# Patient Record
Sex: Female | Born: 1965 | Race: White | Hispanic: No | Marital: Single | Smoking: Former smoker
Health system: Southern US, Community
[De-identification: ages and names within clinical notes are randomized; demographics above are authoritative.]

## PROBLEM LIST (undated history)

## (undated) DIAGNOSIS — M1990 Unspecified osteoarthritis, unspecified site: Secondary | ICD-10-CM

## (undated) DIAGNOSIS — N200 Calculus of kidney: Secondary | ICD-10-CM

## (undated) DIAGNOSIS — I10 Essential (primary) hypertension: Secondary | ICD-10-CM

## (undated) DIAGNOSIS — F988 Other specified behavioral and emotional disorders with onset usually occurring in childhood and adolescence: Secondary | ICD-10-CM

## (undated) DIAGNOSIS — T7840XA Allergy, unspecified, initial encounter: Secondary | ICD-10-CM

## (undated) DIAGNOSIS — R569 Unspecified convulsions: Secondary | ICD-10-CM

## (undated) DIAGNOSIS — G709 Myoneural disorder, unspecified: Secondary | ICD-10-CM

## (undated) DIAGNOSIS — G2581 Restless legs syndrome: Secondary | ICD-10-CM

## (undated) DIAGNOSIS — N83201 Unspecified ovarian cyst, right side: Secondary | ICD-10-CM

## (undated) DIAGNOSIS — Z87442 Personal history of urinary calculi: Secondary | ICD-10-CM

## (undated) DIAGNOSIS — F329 Major depressive disorder, single episode, unspecified: Secondary | ICD-10-CM

## (undated) DIAGNOSIS — F32A Depression, unspecified: Secondary | ICD-10-CM

## (undated) DIAGNOSIS — R011 Cardiac murmur, unspecified: Secondary | ICD-10-CM

## (undated) DIAGNOSIS — D649 Anemia, unspecified: Secondary | ICD-10-CM

## (undated) HISTORY — DX: Unspecified ovarian cyst, right side: N83.201

## (undated) HISTORY — DX: Other specified behavioral and emotional disorders with onset usually occurring in childhood and adolescence: F98.8

## (undated) HISTORY — DX: Allergy, unspecified, initial encounter: T78.40XA

## (undated) HISTORY — PX: WISDOM TOOTH EXTRACTION: SHX21

## (undated) HISTORY — DX: Depression, unspecified: F32.A

## (undated) HISTORY — DX: Myoneural disorder, unspecified: G70.9

## (undated) HISTORY — DX: Major depressive disorder, single episode, unspecified: F32.9

## (undated) HISTORY — DX: Anemia, unspecified: D64.9

## (undated) HISTORY — DX: Unspecified osteoarthritis, unspecified site: M19.90

## (undated) HISTORY — DX: Restless legs syndrome: G25.81

## (undated) HISTORY — DX: Unspecified convulsions: R56.9

## (undated) HISTORY — DX: Cardiac murmur, unspecified: R01.1

## (undated) HISTORY — DX: Calculus of kidney: N20.0

---

## 2000-04-15 ENCOUNTER — Emergency Department (HOSPITAL_COMMUNITY): Admission: EM | Admit: 2000-04-15 | Discharge: 2000-04-15 | Payer: Self-pay | Admitting: Emergency Medicine

## 2006-06-20 HISTORY — PX: APPENDECTOMY: SHX54

## 2007-06-21 HISTORY — PX: BUNIONECTOMY: SHX129

## 2010-03-18 ENCOUNTER — Ambulatory Visit (HOSPITAL_COMMUNITY): Admission: RE | Admit: 2010-03-18 | Discharge: 2010-03-18 | Payer: Self-pay | Admitting: Obstetrics & Gynecology

## 2013-06-20 LAB — HM PAP SMEAR
HM Pap smear: 1012015
HM Pap smear: 1012015

## 2013-08-05 ENCOUNTER — Other Ambulatory Visit: Payer: Self-pay

## 2013-08-05 ENCOUNTER — Ambulatory Visit (INDEPENDENT_AMBULATORY_CARE_PROVIDER_SITE_OTHER): Payer: Managed Care, Other (non HMO) | Admitting: Family Medicine

## 2013-08-05 ENCOUNTER — Encounter: Payer: Self-pay | Admitting: Family Medicine

## 2013-08-05 VITALS — BP 122/88 | Temp 99.0°F | Ht 65.75 in | Wt 202.0 lb

## 2013-08-05 DIAGNOSIS — Z7689 Persons encountering health services in other specified circumstances: Secondary | ICD-10-CM

## 2013-08-05 DIAGNOSIS — E669 Obesity, unspecified: Secondary | ICD-10-CM

## 2013-08-05 DIAGNOSIS — Z7189 Other specified counseling: Secondary | ICD-10-CM

## 2013-08-05 DIAGNOSIS — Z0000 Encounter for general adult medical examination without abnormal findings: Secondary | ICD-10-CM

## 2013-08-05 NOTE — Patient Instructions (Signed)
-  We have ordered labs or studies at this visit. It can take up to 1-2 weeks for results and processing. We will contact you with instructions IF your results are abnormal. Normal results will be released to your [facility]. If you have not heard from [place] or can not find your results in [facility] in 2 weeks please contact our office.  -PLEASE SIGN UP FOR MYCHART TODAY   We recommend the following healthy lifestyle measures: - eat a healthy diet consisting of lots of vegetables, fruits, beans, nuts, seeds, healthy meats such as white chicken and fish and whole grains.  - avoid fried foods, fast food, processed foods, sodas, red meet and other fattening foods.  - get a least 150 minutes of aerobic exercise per week.   Follow up in: 6 months or as needed

## 2013-08-05 NOTE — Addendum Note (Signed)
Addended by: Elmer Picker on: 08/05/2013 02:08 PM   Modules accepted: Orders

## 2013-08-05 NOTE — Progress Notes (Signed)
Chief Complaint  Patient presents with  . Establish Care    HPI:  Sarah Simpson is here to establish care. Recently got insurance. Last PCP and physical: [facility] in [place] for gyn - had physical; no blood work done - sees gyn for heavy menstrual cycles.  Has the following chronic problems and concerns today:  There are no active problems to display for this patient.  Depression/ADD - reports tested and mild: -used to be on prozac -off for 3 years -lost mother -felt like counseling didn't help -doesn't want to do any medicine for now as is doing better -no SI  Restless leg syndrome: -used to take neuronotin, but does not want to take something now  Health Maintenance: Has had colonoscopy - she is going to schedule  ROS: See pertinent positives and negatives per HPI.  Past Medical History  Diagnosis Date  . Depression   . ADD (attention deficit disorder)   . Restless leg syndrome     Family History  Problem Relation Age of Onset  . Colon cancer Father 20  . Colon cancer Mother 43  . Heart attack Mother   . Dementia Mother     History   Social History  . Marital Status: Single    Spouse Name: N/A    Number of Children: N/A  . Years of Education: N/A   Social History Main Topics  . Smoking status: Former Research scientist (life sciences)  . Smokeless tobacco: None     Comment: on and off in the past, quit in 2002  . Alcohol Use: Yes     Comment: occ  . Drug Use: None  . Sexual Activity: None   Other Topics Concern  . None   Social History Narrative   Work or School: Clinical biochemist      Home Situation: none      Spiritual Beliefs: Christian      Lifestyle: no regular exercise; so so             No current outpatient prescriptions on file.  EXAMDanley Danker Vitals:   08/05/13 1323  BP: 122/88  Temp: 99 F (37.2 C)    Body mass index is 32.85 kg/(m^2).  GENERAL: vitals reviewed and listed above, alert, oriented, appears well hydrated and in no  acute distress  HEENT: atraumatic, conjunttiva clear, no obvious abnormalities on inspection of external nose and ears  NECK: no obvious masses on inspection  LUNGS: clear to auscultation bilaterally, no wheezes, rales or rhonchi, good air movement  CV: HRRR, no peripheral edema  MS: moves all extremities without noticeable abnormality  PSYCH: pleasant and cooperative, no obvious depression or anxiety  ASSESSMENT AND PLAN:  Discussed the following assessment and plan:  Encounter to establish care  Obesity, unspecified - Plan: TSH, T4, Free  Visit for preventive health examination - Plan: Lipid panel, Hemoglobin A1c  -We reviewed the PMH, PSH, FH, SH, Meds and Allergies. -We provided refills for any medications we will prescribe as needed. -We addressed current concerns per orders and patient instructions. -We have asked for records for pertinent exams, studies, vaccines and notes from previous providers. -We have advised patient to follow up per instructions below. -NON-FASTING labs -discussed may start prozac if needed, holding off for now but will call if wants to start   -Patient advised to return or notify a doctor immediately if symptoms worsen or persist or new concerns arise.  Patient Instructions  -We have ordered labs or studies at this  visit. It can take up to 1-2 weeks for results and processing. We will contact you with instructions IF your results are abnormal. Normal results will be released to your [facility]. If you have not heard from [place] or can not find your results in [facility] in 2 weeks please contact our office.  -PLEASE SIGN UP FOR MYCHART TODAY   We recommend the following healthy lifestyle measures: - eat a healthy diet consisting of lots of vegetables, fruits, beans, nuts, seeds, healthy meats such as white chicken and fish and whole grains.  - avoid fried foods, fast food, processed foods, sodas, red meet and other fattening foods.  - get a least 150  minutes of aerobic exercise per week.   Follow up in: 6 months or as needed      KIM, HANNAH R.

## 2013-08-05 NOTE — Addendum Note (Signed)
Addended by: Elmer Picker on: 08/05/2013 02:11 PM   Modules accepted: Orders

## 2013-08-06 LAB — LIPID PANEL
CHOLESTEROL: 176 mg/dL (ref 0–200)
HDL: 39 mg/dL — ABNORMAL LOW (ref >39)
LDL Cholesterol: 69 mg/dL (ref 0–99)
TRIGLYCERIDES: 339 mg/dL — AB (ref <150)
Total CHOL/HDL Ratio: 4.5 Ratio
VLDL: 68 mg/dL — AB (ref 0–40)

## 2013-08-06 LAB — HEMOGLOBIN A1C
HEMOGLOBIN A1C: 5.6 % (ref <5.7)
MEAN PLASMA GLUCOSE: 114 mg/dL (ref <117)

## 2013-08-06 LAB — T4, FREE: Free T4: 1.12 ng/dL (ref 0.80–1.80)

## 2013-08-06 LAB — TSH: TSH: 1.36 u[IU]/mL (ref 0.350–4.500)

## 2013-10-03 ENCOUNTER — Ambulatory Visit (INDEPENDENT_AMBULATORY_CARE_PROVIDER_SITE_OTHER): Payer: Managed Care, Other (non HMO) | Admitting: Family Medicine

## 2013-10-03 ENCOUNTER — Ambulatory Visit: Payer: Managed Care, Other (non HMO)

## 2013-10-03 VITALS — BP 118/78 | HR 56 | Temp 98.7°F | Resp 16 | Ht 66.5 in | Wt 205.2 lb

## 2013-10-03 DIAGNOSIS — M545 Low back pain, unspecified: Secondary | ICD-10-CM

## 2013-10-03 DIAGNOSIS — Z8669 Personal history of other diseases of the nervous system and sense organs: Secondary | ICD-10-CM

## 2013-10-03 DIAGNOSIS — M1990 Unspecified osteoarthritis, unspecified site: Secondary | ICD-10-CM

## 2013-10-03 DIAGNOSIS — Z87898 Personal history of other specified conditions: Secondary | ICD-10-CM

## 2013-10-03 MED ORDER — MELOXICAM 15 MG PO TABS: 15.0000 mg | ORAL_TABLET | Freq: Every day | ORAL | Status: DC

## 2013-10-03 MED ORDER — HYDROCODONE-ACETAMINOPHEN 5-325 MG PO TABS: 1.0000 | ORAL_TABLET | Freq: Four times a day (QID) | ORAL | Status: DC | PRN

## 2013-10-03 NOTE — Patient Instructions (Signed)
Back Pain, Adult  Back pain is very common. The pain often gets better over time. The cause of back pain is usually not dangerous. Most people can learn to manage their back pain on their own.   HOME CARE   · Stay active. Start with short walks on flat ground if you can. Try to walk farther each day.  · Do not sit, drive, or stand in one place for more than 30 minutes. Do not stay in bed.  · Do not avoid exercise or work. Activity can help your back heal faster.  · Be careful when you bend or lift an object. Bend at your knees, keep the object close to you, and do not twist.  · Sleep on a firm mattress. Lie on your side, and bend your knees. If you lie on your back, put a pillow under your knees.  · Only take medicines as told by your doctor.  · Put ice on the injured area.  · Put ice in a plastic bag.  · Place a towel between your skin and the bag.  · Leave the ice on for 15-20 minutes, 03-04 times a day for the first 2 to 3 days. After that, you can switch between ice and heat packs.  · Ask your doctor about back exercises or massage.  · Avoid feeling anxious or stressed. Find good ways to deal with stress, such as exercise.  GET HELP RIGHT AWAY IF:   · Your pain does not go away with rest or medicine.  · Your pain does not go away in 1 week.  · You have new problems.  · You do not feel well.  · The pain spreads into your legs.  · You cannot control when you poop (bowel movement) or pee (urinate).  · Your arms or legs feel weak or lose feeling (numbness).  · You feel sick to your stomach (nauseous) or throw up (vomit).  · You have belly (abdominal) pain.  · You feel like you may pass out (faint).  MAKE SURE YOU:   · Understand these instructions.  · Will watch your condition.  · Will get help right away if you are not doing well or get worse.  Document Released: 11/23/2007 Document Revised: 08/29/2011 Document Reviewed: 10/25/2010  Exit[employer]® Patient Information ©2014 [employer].
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
## 2013-10-04 ENCOUNTER — Telehealth: Payer: Self-pay | Admitting: Family Medicine

## 2013-10-04 ENCOUNTER — Telehealth: Payer: Self-pay

## 2013-10-04 NOTE — Telephone Encounter (Signed)
Patient was seen yesterday (10/03/2013) for back pain and states that the Meloxicam is not working for her. Can she have something else? [facility]   [phone]

## 2013-10-04 NOTE — Telephone Encounter (Signed)
LM that I had given her a rx for norco which she should still have in her paper work and she should fill that for pain relief.

## 2013-10-04 NOTE — Telephone Encounter (Signed)
Patient called back and states that it is the Norco not the Meloxicam that is not working well for her (states that she gave the wrong medicine name when she called earlier). Can she have something else?  [phone]

## 2013-10-05 NOTE — Telephone Encounter (Signed)
Spoke with pt , advise that she can double her norco so take 1-2  5/325 mg TID  Prn.

## 2013-10-05 NOTE — Telephone Encounter (Signed)
I told her to double the norco and see if that helps, if not then she can pick a r in the [place] but needs to call me to let me know. I can rx her percocet. We spoke on the phone.

## 2013-11-10 ENCOUNTER — Other Ambulatory Visit: Payer: Self-pay | Admitting: Family Medicine

## 2014-10-28 ENCOUNTER — Ambulatory Visit: Payer: Managed Care, Other (non HMO) | Admitting: Family Medicine

## 2015-12-31 ENCOUNTER — Ambulatory Visit: Payer: Managed Care, Other (non HMO) | Admitting: Family Medicine

## 2016-01-01 ENCOUNTER — Ambulatory Visit (INDEPENDENT_AMBULATORY_CARE_PROVIDER_SITE_OTHER): Payer: Managed Care, Other (non HMO) | Admitting: Family Medicine

## 2016-01-01 ENCOUNTER — Encounter: Payer: Self-pay | Admitting: Family Medicine

## 2016-01-01 VITALS — BP 128/80 | HR 73 | Temp 98.5°F | Ht 66.5 in | Wt 195.2 lb

## 2016-01-01 DIAGNOSIS — M722 Plantar fascial fibromatosis: Secondary | ICD-10-CM

## 2016-01-01 DIAGNOSIS — M5442 Lumbago with sciatica, left side: Secondary | ICD-10-CM

## 2016-01-01 DIAGNOSIS — M5416 Radiculopathy, lumbar region: Secondary | ICD-10-CM

## 2016-01-01 MED ORDER — TRAMADOL HCL 50 MG PO TABS: 50.0000 mg | ORAL_TABLET | Freq: Two times a day (BID) | ORAL | Status: DC | PRN

## 2016-01-01 MED ORDER — PREDNISONE 10 MG PO TABS: ORAL_TABLET | ORAL | Status: DC

## 2016-01-01 MED ORDER — CYCLOBENZAPRINE HCL 5 MG PO TABS: 5.0000 mg | ORAL_TABLET | Freq: Every evening | ORAL | Status: DC | PRN

## 2016-01-01 NOTE — Patient Instructions (Signed)
BEFORE YOU LEAVE: -follow up: physical exam in 2-3 months  Call you foot specialist about the foot. Please let him know if you take the prednisone.  Flexeril at night.  Exercises 4 days per week for the back.  Tylenol 500-1000mg  up to a maximum of 3 times per day.  Heat 15 minutes twice daily.  Topical sports creams with menthol or capsaicin can also help with pain.  -We placed a referral for you as discussed to the back specialist. It usually takes about 1-2 weeks to process and schedule this referral. If you have not heard from [place] regarding this appointment in 2 weeks please contact our office.
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
## 2016-01-01 NOTE — Addendum Note (Signed)
Addended by: Lucretia Kern on: 01/01/2016 05:52 PM   Modules accepted: Orders

## 2016-01-01 NOTE — Progress Notes (Signed)
Pre visit review using our clinic review tool, if applicable. No additional management support is needed unless otherwise documented below in the visit note. 

## 2016-01-04 ENCOUNTER — Ambulatory Visit: Payer: Managed Care, Other (non HMO) | Admitting: Family Medicine

## 2016-02-09 ENCOUNTER — Encounter: Payer: Self-pay | Admitting: Family Medicine

## 2016-04-19 ENCOUNTER — Encounter: Payer: Managed Care, Other (non HMO) | Admitting: Family Medicine

## 2016-07-13 ENCOUNTER — Encounter: Payer: Self-pay | Admitting: Emergency Medicine

## 2016-07-13 ENCOUNTER — Encounter: Payer: Self-pay | Admitting: Family Medicine

## 2016-07-13 ENCOUNTER — Ambulatory Visit (INDEPENDENT_AMBULATORY_CARE_PROVIDER_SITE_OTHER): Payer: Managed Care, Other (non HMO) | Admitting: Family Medicine

## 2016-07-13 VITALS — BP 126/82 | HR 93 | Temp 99.3°F | Resp 18 | Wt 204.8 lb

## 2016-07-13 DIAGNOSIS — J111 Influenza due to unidentified influenza virus with other respiratory manifestations: Secondary | ICD-10-CM | POA: Diagnosis not present

## 2016-07-13 MED ORDER — OSELTAMIVIR PHOSPHATE 75 MG PO CAPS: 75.0000 mg | ORAL_CAPSULE | Freq: Two times a day (BID) | ORAL | 0 refills | Status: DC

## 2016-07-13 NOTE — Patient Instructions (Signed)
For nasal congestion you can use Afrin nasal spray for 3 days max, Sudafed, saline nasal spray (generic is fine for all). For cough you can try Delsym. Drink enough fluids to make your urine light yellow. For fever/chill/muscle aches you can take over the counter acetaminophen or ibuprofen.  Please come back in if you are not better in 5-7 days or if you develop wheezing, shortness of breath or persistent vomiting.  Oseltamivir capsules What is this medicine? OSELTAMIVIR (os el TAM i vir) is an antiviral medicine. It is used to prevent and to treat some kinds of influenza or the flu. It will not work for colds or other viral infections. This medicine may be used for other purposes; ask your health care provider or pharmacist if you have questions. COMMON BRAND NAME(S): Tamiflu What should I tell my health care provider before I take this medicine? They need to know if you have any of the following conditions: -heart disease -immune system problems -kidney disease -liver disease -lung disease -an unusual or allergic reaction to oseltamivir, other medicines, foods, dyes, or preservatives -pregnant or trying to get pregnant -breast-feeding How should I use this medicine? Take this medicine by mouth with a glass of water. Follow the directions on the prescription label. Start this medicine at the first sign of flu symptoms. You can take it with or without food. If it upsets your stomach, take it with food. Take your medicine at regular intervals. Do not take your medicine more often than directed. Take all of your medicine as directed even if you think you are better. Do not skip doses or stop your medicine early. Talk to your pediatrician regarding the use of this medicine in children. While this drug may be prescribed for children as young as 14 days for selected conditions, precautions do apply. Overdosage: If you think you have taken too much of this medicine contact a poison control center  or emergency room at once. NOTE: This medicine is only for you. Do not share this medicine with others. What if I miss a dose? If you miss a dose, take it as soon as you remember. If it is almost time for your next dose (within 2 hours), take only that dose. Do not take double or extra doses. What may interact with this medicine? Interactions are not expected. This list may not describe all possible interactions. Give your health care provider a list of all the medicines, herbs, non-prescription drugs, or dietary supplements you use. Also tell them if you smoke, drink alcohol, or use illegal drugs. Some items may interact with your medicine. What should I watch for while using this medicine? Visit your doctor or health care professional for regular check ups. Tell your doctor if your symptoms do not start to get better or if they get worse. If you have the flu, you may be at an increased risk of developing seizures, confusion, or abnormal behavior. This occurs early in the illness, and more frequently in children and teens. These events are not common, but may result in accidental injury to the patient. Families and caregivers of patients should watch for signs of unusual behavior and contact a doctor or health care professional right away if the patient shows signs of unusual behavior. This medicine is not a substitute for the flu shot. Talk to your doctor each year about an annual flu shot. What side effects may I notice from receiving this medicine? Side effects that you should report to your doctor  or health care professional as soon as possible: -allergic reactions like skin rash, itching or hives, swelling of the face, lips, or tongue -anxiety, confusion, unusual behavior -breathing problems -hallucination, loss of contact with reality -redness, blistering, peeling or loosening of the skin, including inside the mouth -seizures Side effects that usually do not require medical attention  (report to your doctor or health care professional if they continue or are bothersome): -diarrhea -headache -nausea, vomiting -pain This list may not describe all possible side effects. Call your doctor for medical advice about side effects. You may report side effects to FDA at 1-800-FDA-1088. Where should I keep my medicine? Keep out of the reach of children. Store at room temperature between 15 and 30 degrees C (59 and 86 degrees F). Throw away any unused medicine after the expiration date. NOTE: This sheet is a summary. It may not cover all possible information. If you have questions about this medicine, talk to your doctor, pharmacist, or health care provider.  2017 Elsevier/Gold Standard (2014-12-10 10:50:39)  Influenza, Adult Influenza, more commonly known as "the flu," is a viral infection that primarily affects the respiratory tract. The respiratory tract includes organs that help you breathe, such as the lungs, nose, and throat. The flu causes many common cold symptoms, as well as a high fever and body aches. The flu spreads easily from person to person (is contagious). Getting a flu shot (influenza vaccination) every year is the best way to prevent influenza. What are the causes? Influenza is caused by a virus. You can catch the virus by:  Breathing in droplets from an infected person's cough or sneeze.  Touching something that was recently contaminated with the virus and then touching your mouth, nose, or eyes. What increases the risk? The following factors may make you more likely to get the flu:  Not cleaning your hands frequently with soap and water or alcohol-based hand sanitizer.  Having close contact with many people during cold and flu season.  Touching your mouth, eyes, or nose without washing or sanitizing your hands first.  Not drinking enough fluids or not eating a healthy diet.  Not getting enough sleep or exercise.  Being under a high amount of  stress.  Not getting a yearly (annual) flu shot. You may be at a higher risk of complications from the flu, such as a severe lung infection (pneumonia), if you:  Are over the age of 25.  Are pregnant.  Have a weakened disease-fighting system (immune system). You may have a weakened immune system if you:  Have HIV or AIDS.  Are undergoing chemotherapy.  Aretaking medicines that reduce the activity of (suppress) the immune system.  Have a long-term (chronic) illness, such as heart disease, kidney disease, diabetes, or lung disease.  Have a liver disorder.  Are obese.  Have anemia. What are the signs or symptoms? Symptoms of this condition typically last 4-10 days and may include:  Fever.  Chills.  Headache, body aches, or muscle aches.  Sore throat.  Cough.  Runny or congested nose.  Chest discomfort and cough.  Poor appetite.  Weakness or tiredness (fatigue).  Dizziness.  Nausea or vomiting. How is this diagnosed? This condition may be diagnosed based on your medical history and a physical exam. Your health care provider may do a nose or throat swab test to confirm the diagnosis. How is this treated? If influenza is detected early, you can be treated with antiviral medicine that can reduce the length of your illness  and the severity of your symptoms. This medicine may be given by mouth (orally) or through an IV tube that is inserted in one of your veins. The goal of treatment is to relieve symptoms by taking care of yourself at home. This may include taking over-the-counter medicines, drinking plenty of fluids, and adding humidity to the air in your home. In some cases, influenza goes away on its own. Severe influenza or complications from influenza may be treated in a hospital. Follow these instructions at home:  Take over-the-counter and prescription medicines only as told by your health care provider.  Use a cool mist humidifier to add humidity to the air  in your home. This can make breathing easier.  Rest as needed.  Drink enough fluid to keep your urine clear or pale yellow.  Cover your mouth and nose when you cough or sneeze.  Wash your hands with soap and water often, especially after you cough or sneeze. If soap and water are not available, use hand sanitizer.  Stay home from work or school as told by your health care provider. Unless you are visiting your health care provider, try to avoid leaving home until your fever has been gone for 24 hours without the use of medicine.  Keep all follow-up visits as told by your health care provider. This is important. How is this prevented?  Getting an annual flu shot is the best way to avoid getting the flu. You may get the flu shot in late summer, fall, or winter. Ask your health care provider when you should get your flu shot.  Wash your hands often or use hand sanitizer often.  Avoid contact with people who are sick during cold and flu season.  Eat a healthy diet, drink plenty of fluids, get enough sleep, and exercise regularly. Contact a health care provider if:  You develop new symptoms.  You have:  Chest pain.  Diarrhea.  A fever.  Your cough gets worse.  You produce more mucus.  You feel nauseous or you vomit. Get help right away if:  You develop shortness of breath or difficulty breathing.  Your skin or nails turn a bluish color.  You have severe pain or stiffness in your neck.  You develop a sudden headache or sudden pain in your face or ear.  You cannot stop vomiting. This information is not intended to replace advice given to you by your health care provider. Make sure you discuss any questions you have with your health care provider. Document Released: 06/03/2000 Document Revised: 11/12/2015 Document Reviewed: 03/31/2015 Elsevier Interactive Patient Education  2017 [employer].

## 2016-07-13 NOTE — Progress Notes (Addendum)
   Subjective:    Patient ID: Sarah Simpson, female    DOB: 1965/11/09, 51 y.o.   MRN: [id number]  HPI This is a 51 yo female who presents today with sudden onset sinus headache, sneezing, coughing, diarrhea (yesterday, none today), chills, body aches. Has been in bed all day. Has taken Sudafed, Flonase- one dose of each, also ibuprofen 400 mg last night with some relief. Occasional chronic wheeze, nothing new, thinks this is related to her weight. Feels a little SOB, might have started prior to these symptoms. Has frequent sinus infections and thought at first she was getting a sinus infection.    Past Medical History:  Diagnosis Date  . ADD (attention deficit disorder)   . Depression   . Restless leg syndrome    Past Surgical History:  Procedure Laterality Date  . APPENDECTOMY  2008  . BUNIONECTOMY  2008   Family History  Problem Relation Age of Onset  . Colon cancer Father 69  . Colon cancer Mother 37  . Heart attack Mother   . Dementia Mother    Social History  Substance Use Topics  . Smoking status: Former Research scientist (life sciences)  . Smokeless tobacco: Not on file     Comment: on and off in the past, quit in 2002  . Alcohol use Yes     Comment: occ      Review of Systems Per HPI    Objective:   Physical Exam  Constitutional: She is oriented to person, place, and time. She appears well-developed and well-nourished. She appears ill. No distress.  HENT:  Head: Normocephalic and atraumatic.  Right Ear: Tympanic membrane, external ear and ear canal normal.  Left Ear: Tympanic membrane, external ear and ear canal normal.  Nose: Nose normal.  Mouth/Throat: Uvula is midline and oropharynx is clear and moist.  Eyes: Conjunctivae are normal.  Neck: Normal range of motion. Neck supple.  Cardiovascular: Normal rate, regular rhythm and normal heart sounds.   Pulmonary/Chest: Effort normal and breath sounds normal.  Neurological: She is alert and oriented to person, place, and time.    Skin: Skin is warm and dry. She is not diaphoretic.  Psychiatric: She has a normal mood and affect. Her behavior is normal. Judgment and thought content normal.  Vitals reviewed.        BP 126/82 (BP Location: Left Arm, Patient Position: Sitting, Cuff Size: Normal)   Pulse 93   Temp 99.3 F (37.4 C) (Oral)   Resp 18   Wt 204 lb 12.8 oz (92.9 kg)   LMP 04/05/2016 (Approximate)   SpO2 97%   BMI 32.56 kg/m  Wt Readings from Last 3 Encounters:  07/13/16 204 lb 12.8 oz (92.9 kg)  01/01/16 195 lb 3.2 oz (88.5 kg)  10/03/13 205 lb 3.2 oz (93.1 kg)    Assessment & Plan:  1. Influenza - Provided written and verbal information regarding diagnosis and treatment. - RTC/ER precautions reviewed - discussed potential side effects of oseltamivir and provided written information - oseltamivir (TAMIFLU) 75 MG capsule; Take 1 capsule (75 mg total) by mouth 2 (two) times daily.  Dispense: 10 capsule; Refill: 0 - symptomatic treatment measures discussed, rest, hydrate  Clarene Reamer, FNP-BC  [facility]  07/13/2016 3:46 PM

## 2016-07-13 NOTE — Progress Notes (Signed)
Pre visit review using our clinic review tool, if applicable. No additional management support is needed unless otherwise documented below in the visit note. 

## 2016-07-26 NOTE — Progress Notes (Signed)
Medical screening examination/treatment/procedure(s) were performed by non-physician practitioner and as supervising physician I was immediately available for consultation/collaboration. I agree with above. Geraldin Habermehl, DO   

## 2016-11-03 ENCOUNTER — Ambulatory Visit (INDEPENDENT_AMBULATORY_CARE_PROVIDER_SITE_OTHER): Payer: Managed Care, Other (non HMO) | Admitting: Podiatry

## 2016-11-03 ENCOUNTER — Ambulatory Visit (INDEPENDENT_AMBULATORY_CARE_PROVIDER_SITE_OTHER): Payer: Managed Care, Other (non HMO)

## 2016-11-03 ENCOUNTER — Encounter: Payer: Self-pay | Admitting: Podiatry

## 2016-11-03 DIAGNOSIS — M722 Plantar fascial fibromatosis: Secondary | ICD-10-CM

## 2016-11-03 DIAGNOSIS — T847XXA Infection and inflammatory reaction due to other internal orthopedic prosthetic devices, implants and grafts, initial encounter: Secondary | ICD-10-CM

## 2016-11-03 DIAGNOSIS — M2010 Hallux valgus (acquired), unspecified foot: Secondary | ICD-10-CM | POA: Diagnosis not present

## 2016-11-03 MED ORDER — MELOXICAM 15 MG PO TABS: 15.0000 mg | ORAL_TABLET | Freq: Every day | ORAL | 3 refills | Status: DC

## 2016-11-03 MED ORDER — METHYLPREDNISOLONE 4 MG PO TBPK: ORAL_TABLET | ORAL | 0 refills | Status: DC

## 2016-11-03 NOTE — Progress Notes (Signed)
   Subjective:    Patient ID: Sarah Simpson, female    DOB: 01/15/1966, 51 y.o.   MRN: [id number]  HPI: She presents today with chief complaint of pain to the bilateral foot right greater than last ate this been aching for the past 2 months she used to see Dr. in [place]. She states that he get her bunion surgery. She was placed in an Aircast last fall for this fasciitis that did not help. She's been taking Tylenol and Advil as needed. She also has pain to the first metatarsophalangeal joint right foot where she has a nodule after the surgery.  Review of Systems  Constitutional: Positive for unexpected weight change.  Eyes: Positive for redness and itching.  Musculoskeletal: Positive for back pain and gait problem.  All other systems reviewed and are negative.      Objective:   Physical Exam: Vital signs are stable she is alert and oriented 3. Pulses are palpable. Neurologic sensorium is intact. Deep tendon reflexes are intact. Muscle strength is 5 over 5 dorsiflexion plantar flexors and inverters everters on physical musculatures intact. Orthopedic evaluation demonstrates good range of motion and good correction of bunion deformities bilateral. She has multiple K wires that are present and extruding based on evaluation physical findings and radiographs. She has plantar distally oriented calcaneal heel spurs with soft tissue increase in density the plantar fascia calcaneal insertion site she also has pain on palpation medial calcaneal tubercles bilateral right greater than left.     Assessment & Plan:  Assessment: Plantar fasciitis chronic in nature. Painful internal fixation right first metatarsophalangeal joint.  Plan: Injected the right and left heels today. Dispensed plantar fascia braces and a night splint. Start her on a Medrol Dosepak to be followed by meloxicam. We will follow up with her in a couple of weeks to make sure she is doing well.

## 2016-11-03 NOTE — Patient Instructions (Signed)

## 2016-11-24 ENCOUNTER — Ambulatory Visit: Payer: Managed Care, Other (non HMO) | Admitting: Podiatry

## 2017-02-05 ENCOUNTER — Emergency Department (HOSPITAL_COMMUNITY)
Admission: EM | Admit: 2017-02-05 | Discharge: 2017-02-05 | Disposition: A | Discharge status: Home / Self Care | Payer: Managed Care, Other (non HMO) | Attending: Emergency Medicine | Admitting: Emergency Medicine

## 2017-02-05 ENCOUNTER — Emergency Department (HOSPITAL_COMMUNITY): Payer: Managed Care, Other (non HMO)

## 2017-02-05 ENCOUNTER — Encounter (HOSPITAL_COMMUNITY): Payer: Self-pay | Admitting: Emergency Medicine

## 2017-02-05 DIAGNOSIS — Z87891 Personal history of nicotine dependence: Secondary | ICD-10-CM | POA: Diagnosis not present

## 2017-02-05 DIAGNOSIS — Z79899 Other long term (current) drug therapy: Secondary | ICD-10-CM | POA: Insufficient documentation

## 2017-02-05 DIAGNOSIS — R109 Unspecified abdominal pain: Secondary | ICD-10-CM | POA: Diagnosis present

## 2017-02-05 DIAGNOSIS — F909 Attention-deficit hyperactivity disorder, unspecified type: Secondary | ICD-10-CM | POA: Insufficient documentation

## 2017-02-05 DIAGNOSIS — F329 Major depressive disorder, single episode, unspecified: Secondary | ICD-10-CM | POA: Diagnosis not present

## 2017-02-05 DIAGNOSIS — K5732 Diverticulitis of large intestine without perforation or abscess without bleeding: Secondary | ICD-10-CM | POA: Insufficient documentation

## 2017-02-05 DIAGNOSIS — D259 Leiomyoma of uterus, unspecified: Secondary | ICD-10-CM | POA: Diagnosis not present

## 2017-02-05 DIAGNOSIS — K5792 Diverticulitis of intestine, part unspecified, without perforation or abscess without bleeding: Secondary | ICD-10-CM

## 2017-02-05 LAB — URINALYSIS, ROUTINE W REFLEX MICROSCOPIC
Bilirubin Urine: NEGATIVE
GLUCOSE, UA: NEGATIVE mg/dL
HGB URINE DIPSTICK: NEGATIVE
KETONES UR: 20 mg/dL — AB
NITRITE: NEGATIVE
PH: 5 (ref 5.0–8.0)
PROTEIN: 30 mg/dL — AB
Specific Gravity, Urine: 1.027 (ref 1.005–1.030)

## 2017-02-05 LAB — COMPREHENSIVE METABOLIC PANEL
ALBUMIN: 3.8 g/dL (ref 3.5–5.0)
ALT: 16 U/L (ref 14–54)
AST: 17 U/L (ref 15–41)
Alkaline Phosphatase: 49 U/L (ref 38–126)
Anion gap: 5 (ref 5–15)
BILIRUBIN TOTAL: 0.7 mg/dL (ref 0.3–1.2)
BUN: 7 mg/dL (ref 6–20)
CHLORIDE: 103 mmol/L (ref 101–111)
CO2: 28 mmol/L (ref 22–32)
CREATININE: 0.61 mg/dL (ref 0.44–1.00)
Calcium: 9.1 mg/dL (ref 8.9–10.3)
GFR calc Af Amer: >60 mL/min (ref >60)
GFR calc non Af Amer: >60 mL/min (ref >60)
GLUCOSE: 109 mg/dL — AB (ref 65–99)
POTASSIUM: 3.7 mmol/L (ref 3.5–5.1)
Sodium: 136 mmol/L (ref 135–145)
Total Protein: 7.2 g/dL (ref 6.5–8.1)

## 2017-02-05 LAB — CBC
HEMATOCRIT: 39.3 % (ref 36.0–46.0)
Hemoglobin: 13.6 g/dL (ref 12.0–15.0)
MCH: 31.3 pg (ref 26.0–34.0)
MCHC: 34.6 g/dL (ref 30.0–36.0)
MCV: 90.3 fL (ref 78.0–100.0)
Platelets: 275 10*3/uL (ref 150–400)
RBC: 4.35 MIL/uL (ref 3.87–5.11)
RDW: 12.2 % (ref 11.5–15.5)
WBC: 7.3 10*3/uL (ref 4.0–10.5)

## 2017-02-05 LAB — POC URINE PREG, ED: Preg Test, Ur: NEGATIVE

## 2017-02-05 LAB — LIPASE, BLOOD: LIPASE: 24 U/L (ref 11–51)

## 2017-02-05 MED ORDER — CIPROFLOXACIN HCL 500 MG PO TABS
500.0000 mg | ORAL_TABLET | Freq: Once | ORAL | Status: AC
  Administered 2017-02-05: 500 mg via ORAL
  Filled 2017-02-05: qty 1

## 2017-02-05 MED ORDER — IOPAMIDOL (ISOVUE-300) INJECTION 61%
INTRAVENOUS | Status: AC
  Administered 2017-02-05: 100 mL
  Filled 2017-02-05: qty 100

## 2017-02-05 MED ORDER — METRONIDAZOLE 500 MG PO TABS
500.0000 mg | ORAL_TABLET | Freq: Once | ORAL | Status: AC
  Administered 2017-02-05: 500 mg via ORAL
  Filled 2017-02-05: qty 1

## 2017-02-05 MED ORDER — OXYCODONE-ACETAMINOPHEN 5-325 MG PO TABS: 2.0000 | ORAL_TABLET | ORAL | 0 refills | Status: DC | PRN

## 2017-02-05 MED ORDER — METRONIDAZOLE 500 MG PO TABS: 500.0000 mg | ORAL_TABLET | Freq: Two times a day (BID) | ORAL | 0 refills | Status: DC

## 2017-02-05 MED ORDER — CIPROFLOXACIN HCL 500 MG PO TABS: 500.0000 mg | ORAL_TABLET | Freq: Two times a day (BID) | ORAL | 0 refills | Status: DC

## 2017-02-05 MED ORDER — ONDANSETRON HCL 4 MG PO TABS: 4.0000 mg | ORAL_TABLET | Freq: Four times a day (QID) | ORAL | 0 refills | Status: DC

## 2017-02-05 MED ORDER — OXYCODONE-ACETAMINOPHEN 5-325 MG PO TABS
1.0000 | ORAL_TABLET | Freq: Once | ORAL | Status: AC
  Administered 2017-02-05: 1 via ORAL
  Filled 2017-02-05: qty 1

## 2017-02-05 NOTE — ED Provider Notes (Signed)
[facility] DEPT Provider Note   CSN: [id number] Arrival date & time: 02/05/17  1354     History   Chief Complaint Chief Complaint  Patient presents with  . Abdominal Pain    HPI Sarah Simpson is a 51 y.o. female.  HPI   51 year old female with a significant past medical history of diverticulosis and subsequent diverticulitis presents today with complaints of abdominal pain.  Patient reports symptoms started 3 days ago with lobar abdominal pain.  Patient notes she felt chills and had a low-grade fever.  She followed up at urgent care and was diagnosed with suspected diverticulitis.  She was started on metronidazole and Cipro, and also pain medication.  Patient reports she has felt improvement with using pain medication but still has the lower abdominal pain.   Patient reports some nausea, denies any vomiting.  She reports she has not had a bowel movement in the last 3 days.   Past Medical History:  Diagnosis Date  . ADD (attention deficit disorder)   . Depression   . Restless leg syndrome     There are no active problems to display for this patient.   Past Surgical History:  Procedure Laterality Date  . APPENDECTOMY  2008  . BUNIONECTOMY  2008    OB History    No data available       Home Medications    Prior to Admission medications   Medication Sig Start Date End Date Taking? Authorizing Provider  ciprofloxacin (CIPRO) 500 MG tablet Take 1 tablet (500 mg total) by mouth every 12 (twelve) hours. 02/05/17   Kendan Cornforth, Dellis Filbert, PA-C  fluorometholone (FML) 0.1 % ophthalmic suspension instill 1 drop into both eyes four times a day for 1 week then 1 ...  (REFER TO PRESCRIPTION NOTES). 10/07/16   [provider]  gabapentin (NEURONTIN) 100 MG capsule  06/30/16   [provider]  MAGNESIUM ASPARTATE PO Take by mouth.    [provider]  MELATONIN PO Take by mouth.    [provider]  meloxicam (MOBIC) 15 MG tablet Take 1 tablet (15  mg total) by mouth daily. 11/03/16   Hyatt, Max T, DPM  methylPREDNISolone (MEDROL DOSEPAK) 4 MG TBPK tablet 6 day dose pack - take as directed 11/03/16   Hyatt, Max T, DPM  metroNIDAZOLE (FLAGYL) 500 MG tablet Take 1 tablet (500 mg total) by mouth 2 (two) times daily. 02/05/17   Vester Balthazor, Dellis Filbert, PA-C  norethindrone-ethinyl estradiol (JUNEL FE 1/20) 1-20 MG-MCG tablet  05/15/15   [provider]  ondansetron (ZOFRAN) 4 MG tablet Take 1 tablet (4 mg total) by mouth every 6 (six) hours. 02/05/17   Raylei Losurdo, Dellis Filbert, PA-C  oxyCODONE-acetaminophen (PERCOCET/ROXICET) 5-325 MG tablet Take 2 tablets by mouth every 4 (four) hours as needed for severe pain. 02/05/17   Okey Regal, PA-C    Family History Family History  Problem Relation Age of Onset  . Colon cancer Father 24  . Colon cancer Mother 17  . Heart attack Mother   . Dementia Mother     Social History Social History  Substance Use Topics  . Smoking status: Former Research scientist (life sciences)  . Smokeless tobacco: Never Used     Comment: on and off in the past, quit in 2002  . Alcohol use Yes     Comment: occ     Allergies   Patient has no known allergies.   Review of Systems Review of Systems  All other systems reviewed and are negative.  Physical Exam Updated Vital Signs BP 134/75   Pulse 82   Temp 98 F (36.7 C) (Oral)   Resp 16   Ht 5\' 6"  (1.676 m)   Wt 96.2 kg (212 lb)   LMP 01/04/2017   SpO2 100%   BMI 34.22 kg/m   Physical Exam  Constitutional: She is oriented to person, place, and time. She appears well-developed and well-nourished.  HENT:  Head: Normocephalic and atraumatic.  Eyes: Pupils are equal, round, and reactive to light. Conjunctivae are normal. Right eye exhibits no discharge. Left eye exhibits no discharge. No scleral icterus.  Neck: Normal range of motion. No JVD present. No tracheal deviation present.  Pulmonary/Chest: Effort normal. No stridor.  Abdominal:  Mid lower abdominal tenderness to palpation   Neurological: She is alert and oriented to person, place, and time. Coordination normal.  Psychiatric: She has a normal mood and affect. Her behavior is normal. Judgment and thought content normal.  Nursing note and vitals reviewed.    ED Treatments / Results  Labs (all labs ordered are listed, but only abnormal results are displayed) Labs Reviewed  COMPREHENSIVE METABOLIC PANEL - Abnormal; Notable for the following:       Result Value   Glucose, Bld 109 (*)    All other components within normal limits  URINALYSIS, ROUTINE W REFLEX MICROSCOPIC - Abnormal; Notable for the following:    Color, Urine AMBER (*)    APPearance HAZY (*)    Ketones, ur 20 (*)    Protein, ur 30 (*)    Leukocytes, UA SMALL (*)    Bacteria, UA RARE (*)    Squamous Epithelial / LPF 0-5 (*)    All other components within normal limits  LIPASE, BLOOD  CBC  POC URINE PREG, ED    EKG  EKG Interpretation None       Radiology US Transvaginal Non-ob  Result Date: 02/05/2017 CLINICAL DATA:  History fibroids. EXAM: TRANSABDOMINAL AND TRANSVAGINAL ULTRASOUND OF PELVIS TECHNIQUE: Both transabdominal and transvaginal ultrasound examinations of the pelvis were performed. Transabdominal technique was performed for global imaging of the pelvis including uterus, ovaries, adnexal regions, and pelvic cul-de-sac. It was necessary to proceed with endovaginal exam following the transabdominal exam to visualize the endometrium. COMPARISON:  CT of the abdomen and pelvis 02/05/2017 FINDINGS: Uterus Measurements: 7.0 x 3.7 x 6.5 cm. There is an intramural myometrial mass within the anterior right uterine fundus measuring 2.4 x 1.8 x 1.5 cm. A second myometrial mass in the posterior uterine body measures 2.4 x 1.8 x 1.7 cm. Endometrium Thickness: 7.3 mm. Large nabothian cysts in the cervix. The endometrium is otherwise normal, where visualized. Parts of the endometrium are obscured by the 2 myometrial masses. Right ovary  Measurements: 3.0 x 3.3 x 2.2 cm. Normal appearance/no adnexal mass. Benign-appearing cyst measures 2.0 x 1.6 x 1.8 cm. Left ovary Measurements: 2.7 x 0.9 x 1.3 cm. Normal appearance/no adnexal mass. Other findings No abnormal free fluid. IMPRESSION: Two intramural myometrial masses likely representing fibroids measuring 2.4 cm each. Large nabothian cysts may account for the abnormality seen on recent CT. The endometrium measures 7 mm in thickness, where visualized, partially obscured by the myometrial masses. Normal appearance of the ovaries with likely physiologic 2 cm cyst on the right ovary. Electronically Signed   By: Fidela Salisbury M.D.   On: 02/05/2017 20:07   US Pelvis Complete  Result Date: 02/05/2017 CLINICAL DATA:  History fibroids. EXAM: TRANSABDOMINAL AND TRANSVAGINAL ULTRASOUND OF PELVIS TECHNIQUE: Both transabdominal  and transvaginal ultrasound examinations of the pelvis were performed. Transabdominal technique was performed for global imaging of the pelvis including uterus, ovaries, adnexal regions, and pelvic cul-de-sac. It was necessary to proceed with endovaginal exam following the transabdominal exam to visualize the endometrium. COMPARISON:  CT of the abdomen and pelvis 02/05/2017 FINDINGS: Uterus Measurements: 7.0 x 3.7 x 6.5 cm. There is an intramural myometrial mass within the anterior right uterine fundus measuring 2.4 x 1.8 x 1.5 cm. A second myometrial mass in the posterior uterine body measures 2.4 x 1.8 x 1.7 cm. Endometrium Thickness: 7.3 mm. Large nabothian cysts in the cervix. The endometrium is otherwise normal, where visualized. Parts of the endometrium are obscured by the 2 myometrial masses. Right ovary Measurements: 3.0 x 3.3 x 2.2 cm. Normal appearance/no adnexal mass. Benign-appearing cyst measures 2.0 x 1.6 x 1.8 cm. Left ovary Measurements: 2.7 x 0.9 x 1.3 cm. Normal appearance/no adnexal mass. Other findings No abnormal free fluid. IMPRESSION: Two intramural  myometrial masses likely representing fibroids measuring 2.4 cm each. Large nabothian cysts may account for the abnormality seen on recent CT. The endometrium measures 7 mm in thickness, where visualized, partially obscured by the myometrial masses. Normal appearance of the ovaries with likely physiologic 2 cm cyst on the right ovary. Electronically Signed   By: Fidela Salisbury M.D.   On: 02/05/2017 20:07   Ct Abdomen Pelvis W Contrast  Result Date: 02/05/2017 CLINICAL DATA:  Lower abdominal pain. Nausea. Constipation. Low back pain. EXAM: CT ABDOMEN AND PELVIS WITH CONTRAST TECHNIQUE: Multidetector CT imaging of the abdomen and pelvis was performed using the standard protocol following bolus administration of intravenous contrast. CONTRAST:  158mL ISOVUE-300 IOPAMIDOL (ISOVUE-300) INJECTION 61% COMPARISON:  None. FINDINGS: Lower chest: No acute abnormality. Hepatobiliary: No focal liver abnormality is seen. No gallstones, gallbladder wall thickening, or biliary dilatation. 9 mm benign-appearing subcapsular liver cyst. Pancreas: Unremarkable. No pancreatic ductal dilatation or surrounding inflammatory changes. Spleen: Normal in size without focal abnormality. Adrenals/Urinary Tract: Normal appearance of the adrenal glands. No evidence of hydronephrosis. 3 mm nonobstructive left renal calculus. Tiny subcortical hypoattenuation lesions in the left kidney, too small to be actually characterized by CT. Stomach/Bowel: Stomach is within normal limits. Post appendectomy. No evidence of small bowel wall thickening, distention, or inflammatory changes. Short segment of asymmetric mucosal thickening of the distal sigmoid colon with associated pericolonic inflammatory changes, on the background of scattered diverticula, axial images 83/109, sequence 3 and coronal image 38/89, sequence 6. Vascular/Lymphatic: No significant vascular findings are present. No enlarged abdominal or pelvic lymph nodes. Reproductive: No  evidence of adnexal masses. Heterogeneous appearance of the endometrium in the lower uterine segment. Other: No abdominal wall hernia or abnormality. No abdominopelvic ascites. Musculoskeletal: Osteoarthritic changes with disc space narrowing and endplate sclerosis at E9-H3. Mild osteoarthritic changes at L3-L4 and L5-S1. IMPRESSION: Short segment of mucosal thickening and pericolonic inflammatory changes in the distal sigmoid colon on the background of scattered diverticula. These findings likely represent diverticulitis. No evidence of abscess formation. Underlying malignancy cannot be excluded and therefore follow-up after resolution of the acute symptoms is recommended. Abnormal heterogeneous appearance of the endometrium in the lower uterus segment. Follow-up with pelvic ultrasound is recommended for better visualization. 3 mm nonobstructive left renal calculus. Osteoarthritic changes of the lumbosacral spine, most severe at L2-L3. Electronically Signed   By: Fidela Salisbury M.D.   On: 02/05/2017 18:24    Procedures Procedures (including critical care time)  Medications Ordered in ED Medications  iopamidol (ISOVUE-300)  61 % injection (100 mLs  Contrast Given 02/05/17 1759)  metroNIDAZOLE (FLAGYL) tablet 500 mg (500 mg Oral Given 02/05/17 1851)  ciprofloxacin (CIPRO) tablet 500 mg (500 mg Oral Given 02/05/17 1851)  oxyCODONE-acetaminophen (PERCOCET/ROXICET) 5-325 MG per tablet 1 tablet (1 tablet Oral Given 02/05/17 1851)     Initial Impression / Assessment and Plan / ED Course  I have reviewed the triage vital signs and the nursing notes.  Pertinent labs & imaging results that were available during my care of the patient were reviewed by me and considered in my medical decision making (see chart for details).      Final Clinical Impressions(s) / ED Diagnoses   Final diagnoses:  Abdominal pain  Diverticulitis  Uterine leiomyoma, unspecified location    51 year old female presents  today with likely diverticulitis.  She is well-appearing in no acute distress.  She has reassuring laboratory analysis, afebrile and tolerating p.o.  Her CT scan shows no signs of complication.  Patient already has 7 days of therapy prescribed, I prescribed #7 days with the intention of her taking 10 days of therapy.  I discussed that she would need follow-up as an outpatient with her primary care if symptoms persisted, return to emergency room if they worsen.  Patient was read all results of her CT and ultrasound.  She will follow-up as an outpatient for reassessment after improvement in symptoms for findings on CT scan.  She assured she would make this follow-up evaluation.  Patient was given strict return precautions, she verbalized understanding and agreement to today's plan had no further questions or concerns the time discharge.   New Prescriptions New Prescriptions   CIPROFLOXACIN (CIPRO) 500 MG TABLET    Take 1 tablet (500 mg total) by mouth every 12 (twelve) hours.   METRONIDAZOLE (FLAGYL) 500 MG TABLET    Take 1 tablet (500 mg total) by mouth 2 (two) times daily.   ONDANSETRON (ZOFRAN) 4 MG TABLET    Take 1 tablet (4 mg total) by mouth every 6 (six) hours.   OXYCODONE-ACETAMINOPHEN (PERCOCET/ROXICET) 5-325 MG TABLET    Take 2 tablets by mouth every 4 (four) hours as needed for severe pain.     Okey Regal, PA-C 02/05/17 2041    Margette Fast, MD 02/06/17 [phone]

## 2017-02-05 NOTE — Discharge Instructions (Signed)
Please read attached information. If you experience any new or worsening signs or symptoms please return to the emergency room for evaluation. Please follow-up with your primary care provider or specialist as discussed. Please use medication prescribed only as directed and discontinue taking if you have any concerning signs or symptoms.   °

## 2017-02-05 NOTE — ED Notes (Signed)
Patient transported to CT 

## 2017-02-05 NOTE — ED Triage Notes (Signed)
The patient has a history of diverticulitis and has been having abdominal pain for several days.  The patient Fast Med and was given abx and pain meds and she feels like she is getting worse instead of better.  The patient said she took some hydrocodone for the pain at 0900 and also her abxs.  She rates her pain 4/10.

## 2017-02-15 NOTE — Progress Notes (Signed)
Sarah Simpson    [id number]    1965/10/01  Primary Care Physician:Kim, Nickola Major, DO  Referring Physician: Lucretia Kern, DO [address] [place], [place] [id number]  Chief complaint:  Diarrhea, nausea and vomiting  HPI: 51 year old female referred here for evaluation of acute onset nausea, vomiting, diarrhea and lower abdominal pain. Patient presented to the ER on 02/05/2017 and was diagnosed with mild sigmoid diverticulitis , discharged on oral Cipro and Flagyl. On Monday, 02/13/2017 she developed nausea, vomiting and diarrhea multiple bowel movements with no blood. She also had low-grade fever. 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 Diarrhea has improved this morning,  hasn't had any bowel movements but she feels really tired and exhausted. No fever or chills. She has lower abdominal cramps and intermittent nausea.  Last colonoscopy according to patient who was in October 2009, no polyps, sigmoid diverticulosis and internal hemorrhoids. Report is not available during this visit. Patient also had EGD in 2009. She has family history of colon cancer in both her mother and father 37s  Outpatient Encounter Prescriptions as of 02/16/2017  Medication Sig  . ciprofloxacin (CIPRO) 500 MG tablet Take 1 tablet (500 mg total) by mouth every 12 (twelve) hours.  . fluorometholone (FML) 0.1 % ophthalmic suspension instill 1 drop into both eyes four times a day for 1 week then 1 ...  (REFER TO PRESCRIPTION NOTES).  Marland Kitchen gabapentin (NEURONTIN) 100 MG capsule   . MAGNESIUM ASPARTATE PO Take by mouth.  . MELATONIN PO Take by mouth.  . meloxicam (MOBIC) 15 MG tablet Take 1 tablet (15 mg total) by mouth daily.  . methylPREDNISolone (MEDROL DOSEPAK) 4 MG TBPK tablet 6 day dose pack - take as directed  . metroNIDAZOLE (FLAGYL) 500 MG tablet Take 1 tablet (500 mg total) by mouth 2 (two) times  daily.  . norethindrone-ethinyl estradiol (JUNEL FE 1/20) 1-20 MG-MCG tablet   . ondansetron (ZOFRAN) 4 MG tablet Take 1 tablet (4 mg total) by mouth every 6 (six) hours.  Marland Kitchen oxyCODONE-acetaminophen (PERCOCET/ROXICET) 5-325 MG tablet Take 2 tablets by mouth every 4 (four) hours as needed for severe pain.   No facility-administered encounter medications on file as of 02/16/2017.     Allergies as of 02/16/2017  . (No Known Allergies)    Past Medical History:  Diagnosis Date  . ADD (attention deficit disorder)   . Depression   . Restless leg syndrome     Past Surgical History:  Procedure Laterality Date  . APPENDECTOMY  2008  . BUNIONECTOMY  2008    Family History  Problem Relation Age of Onset  . Colon cancer Father 39  . Colon cancer Mother 48  . Heart attack Mother   . Dementia Mother     Social History   Social History  . Marital status: Single    Spouse name: N/A  . Number of children: N/A  . Years of education: N/A   Occupational History  . Not on file.   Social History Main Topics  . Smoking status: Former Research scientist (life sciences)  . Smokeless tobacco: Never Used     Comment: on and off in the past, quit in 2002  . Alcohol use Yes     Comment: occ  . Drug use: Unknown  . Sexual activity: Not on file   Other Topics Concern  . Not on file  Social History Narrative   Work or School: Clinical biochemist      Home Situation: none      Spiritual Beliefs: Christian      Lifestyle: no regular exercise; so so               Review of systems: Review of Systems  Constitutional: Negative for fever and chills.  HENT: Negative.   Eyes: Negative for blurred vision.  Respiratory: Negative for cough, shortness of breath and wheezing.   Cardiovascular: Negative for chest pain and palpitations.  Gastrointestinal: as per HPI Genitourinary: Negative for dysuria, urgency, frequency and hematuria.  Musculoskeletal: Negative for myalgias, back pain and joint pain.  Skin:  Negative for itching and rash.  Neurological: Negative for dizziness, tremors, focal weakness, seizures and loss of consciousness.  Endo/Heme/Allergies: Positive for seasonal allergies.  Psychiatric/Behavioral: Negative for depression, suicidal ideas and hallucinations.  All other systems reviewed and are negative.   Physical Exam: Vitals:   02/16/17 0838  BP: 110/72  Pulse: 82  Temp: 97.9 F (36.6 C)   Body mass index is 33.73 kg/m. Gen:      No acute distress HEENT:  EOMI, sclera anicteric Neck:     No masses; no thyromegaly Lungs:    Clear to auscultation bilaterally; normal respiratory effort CV:         Regular rate and rhythm; no murmurs Abd:      + bowel sounds; soft, non-tender; no palpable masses, no distension Ext:    No edema; adequate peripheral perfusion Skin:      Warm and dry; no rash Neuro: alert and oriented x 3 Psych: normal mood and affect  Data Reviewed:  Reviewed labs, radiology imaging, old records and pertinent past GI work up  CT abdomen and pelvis with contrast 02/05/17 Short segment of mucosal thickening and pericolonic inflammatory changes in the distal sigmoid colon on the background of scattered diverticula. These findings likely represent diverticulitis. No evidence of abscess formation. Underlying malignancy cannot be excluded and therefore follow-up after resolution of the acute symptoms is recommended.  Abnormal heterogeneous appearance of the endometrium in the lower uterus segment. Follow-up with pelvic ultrasound is recommended for better visualization.  3 mm nonobstructive left renal calculus.  Osteoarthritic changes of the lumbosacral spine, most severe at L2-L3.  Assessment and Plan/Recommendations: 51 year old female with history of sigmoid diverticulosis, recent episode of diverticulitis, family history of colon cancer here with acute nausea, vomiting and diarrhea likely secondary to viral gastroenteritis , but we will need  to exclude C. difficile  Hemodynamically stable and is able to tolerate by mouth intake Check GI stool pathogen  CBC and CMP Advised patient to his fluid intake and maintain adequate hydration When necessary Zofran for nausea 0.125 g 3 times daily as needed for abdominal cramps  We'll schedule for colonoscopy after checking results of GI pathogen panel for evaluation s/p diverticulitis and patient is also past due for colorectal cancer screening  The risks and benefits as well as alternatives of endoscopic procedure(s) have been discussed and reviewed. All questions answered. The patient agrees to proceed.   Damaris Hippo , MD [phone] Mon-Fri 8a-5p [phone] after 5p, weekends, holidays  CC: Lucretia Kern, DO

## 2017-02-16 ENCOUNTER — Telehealth: Payer: Self-pay | Admitting: *Deleted

## 2017-02-16 ENCOUNTER — Encounter: Payer: Self-pay | Admitting: Gastroenterology

## 2017-02-16 ENCOUNTER — Other Ambulatory Visit (INDEPENDENT_AMBULATORY_CARE_PROVIDER_SITE_OTHER): Payer: Managed Care, Other (non HMO)

## 2017-02-16 ENCOUNTER — Ambulatory Visit (INDEPENDENT_AMBULATORY_CARE_PROVIDER_SITE_OTHER): Payer: Managed Care, Other (non HMO) | Admitting: Gastroenterology

## 2017-02-16 VITALS — BP 110/72 | HR 82 | Temp 97.9°F | Ht 66.0 in | Wt 209.0 lb

## 2017-02-16 DIAGNOSIS — R197 Diarrhea, unspecified: Secondary | ICD-10-CM

## 2017-02-16 DIAGNOSIS — R112 Nausea with vomiting, unspecified: Secondary | ICD-10-CM | POA: Diagnosis not present

## 2017-02-16 DIAGNOSIS — Z800 Family history of malignant neoplasm of digestive organs: Secondary | ICD-10-CM

## 2017-02-16 DIAGNOSIS — K5732 Diverticulitis of large intestine without perforation or abscess without bleeding: Secondary | ICD-10-CM

## 2017-02-16 LAB — CBC WITH DIFFERENTIAL/PLATELET
BASOS ABS: 0 10*3/uL (ref 0.0–0.1)
Basophils Relative: 0.4 % (ref 0.0–3.0)
EOS ABS: 0.1 10*3/uL (ref 0.0–0.7)
Eosinophils Relative: 2.2 % (ref 0.0–5.0)
HCT: 42.9 % (ref 36.0–46.0)
Hemoglobin: 14.7 g/dL (ref 12.0–15.0)
Lymphocytes Relative: 20.9 % (ref 12.0–46.0)
Lymphs Abs: 1.1 10*3/uL (ref 0.7–4.0)
MCHC: 34.2 g/dL (ref 30.0–36.0)
MCV: 91.4 fl (ref 78.0–100.0)
Monocytes Absolute: 0.3 10*3/uL (ref 0.1–1.0)
Monocytes Relative: 6.2 % (ref 3.0–12.0)
NEUTROS ABS: 3.6 10*3/uL (ref 1.4–7.7)
Neutrophils Relative %: 70.3 % (ref 43.0–77.0)
PLATELETS: 241 10*3/uL (ref 150.0–400.0)
RBC: 4.7 Mil/uL (ref 3.87–5.11)
RDW: 12.3 % (ref 11.5–15.5)
WBC: 5.1 10*3/uL (ref 4.0–10.5)

## 2017-02-16 LAB — COMPREHENSIVE METABOLIC PANEL
ALK PHOS: 38 U/L — AB (ref 39–117)
ALT: 19 U/L (ref 0–35)
AST: 19 U/L (ref 0–37)
Albumin: 3.6 g/dL (ref 3.5–5.2)
BILIRUBIN TOTAL: 0.4 mg/dL (ref 0.2–1.2)
BUN: 9 mg/dL (ref 6–23)
CALCIUM: 8.2 mg/dL — AB (ref 8.4–10.5)
CO2: 26 meq/L (ref 19–32)
CREATININE: 0.6 mg/dL (ref 0.40–1.20)
Chloride: 103 mEq/L (ref 96–112)
GFR: 111.36 mL/min (ref >60.00)
GLUCOSE: 110 mg/dL — AB (ref 70–99)
Potassium: 3.2 mEq/L — ABNORMAL LOW (ref 3.5–5.1)
Sodium: 137 mEq/L (ref 135–145)
TOTAL PROTEIN: 6.3 g/dL (ref 6.0–8.3)

## 2017-02-16 MED ORDER — ONDANSETRON HCL 4 MG PO TABS: 4.0000 mg | ORAL_TABLET | Freq: Three times a day (TID) | ORAL | 1 refills | Status: DC | PRN

## 2017-02-16 MED ORDER — HYOSCYAMINE SULFATE 0.125 MG SL SUBL: 0.1250 mg | SUBLINGUAL_TABLET | Freq: Four times a day (QID) | SUBLINGUAL | 1 refills | Status: DC | PRN

## 2017-02-16 NOTE — Patient Instructions (Addendum)
Go to the basement for labs today  Once results are back we recommend that you schedule a colonoscopy, we will contact you with your results   We have sent in Levsin and Zofran to your pharmacy

## 2017-02-16 NOTE — Telephone Encounter (Signed)
Called and left message for patient, I need her to come sign records release when she comes to turn in her stool studies I will have the form laying on purple folder on my desk

## 2017-02-17 ENCOUNTER — Other Ambulatory Visit: Payer: Managed Care, Other (non HMO)

## 2017-02-17 DIAGNOSIS — R197 Diarrhea, unspecified: Secondary | ICD-10-CM

## 2017-02-17 DIAGNOSIS — R112 Nausea with vomiting, unspecified: Secondary | ICD-10-CM

## 2017-02-21 NOTE — Telephone Encounter (Signed)
Patient came by to drop off stool studies and patient signed ROI.

## 2017-02-22 LAB — GASTROINTESTINAL PATHOGEN PANEL PCR
C. difficile Tox A/B, PCR: NOT DETECTED
Campylobacter, PCR: NOT DETECTED
Cryptosporidium, PCR: NOT DETECTED
E coli (ETEC) LT/ST PCR: NOT DETECTED
E coli (STEC) stx1/stx2, PCR: NOT DETECTED
E coli 0157, PCR: NOT DETECTED
Giardia lamblia, PCR: NOT DETECTED
Norovirus, PCR: DETECTED — CR
Rotavirus A, PCR: NOT DETECTED
Salmonella, PCR: NOT DETECTED
Shigella, PCR: NOT DETECTED

## 2017-02-24 ENCOUNTER — Other Ambulatory Visit (INDEPENDENT_AMBULATORY_CARE_PROVIDER_SITE_OTHER): Payer: Managed Care, Other (non HMO)

## 2017-02-24 DIAGNOSIS — R112 Nausea with vomiting, unspecified: Secondary | ICD-10-CM | POA: Diagnosis not present

## 2017-02-24 DIAGNOSIS — R197 Diarrhea, unspecified: Secondary | ICD-10-CM

## 2017-02-24 LAB — BASIC METABOLIC PANEL
BUN: 8 mg/dL (ref 6–23)
CALCIUM: 8.7 mg/dL (ref 8.4–10.5)
CO2: 28 meq/L (ref 19–32)
Chloride: 103 mEq/L (ref 96–112)
Creatinine, Ser: 0.5 mg/dL (ref 0.40–1.20)
GFR: 137.43 mL/min (ref >60.00)
Glucose, Bld: 118 mg/dL — ABNORMAL HIGH (ref 70–99)
POTASSIUM: 3.7 meq/L (ref 3.5–5.1)
SODIUM: 139 meq/L (ref 135–145)

## 2017-04-05 LAB — HM PAP SMEAR

## 2017-04-17 ENCOUNTER — Encounter: Payer: Self-pay | Admitting: Family Medicine

## 2017-06-27 ENCOUNTER — Encounter: Payer: Self-pay | Admitting: Family Medicine

## 2017-06-27 ENCOUNTER — Ambulatory Visit: Payer: Managed Care, Other (non HMO) | Admitting: Family Medicine

## 2017-06-27 VITALS — BP 124/72 | HR 96 | Temp 98.5°F | Ht 66.0 in | Wt 200.0 lb

## 2017-06-27 DIAGNOSIS — J0100 Acute maxillary sinusitis, unspecified: Secondary | ICD-10-CM | POA: Diagnosis not present

## 2017-06-27 MED ORDER — PREDNISONE 20 MG PO TABS
ORAL_TABLET | ORAL | 0 refills | Status: DC
Start: 2017-06-27 — End: 2018-02-14

## 2017-06-27 MED ORDER — AMOXICILLIN-POT CLAVULANATE 875-125 MG PO TABS: 1.0000 | ORAL_TABLET | Freq: Two times a day (BID) | ORAL | 0 refills | Status: AC

## 2017-06-27 NOTE — Progress Notes (Signed)
PCP: Lucretia Kern, DO  Subjective:  Sarah Simpson is a 52 y.o. year old very pleasant female patient who presents with sinusitis symptoms including nasal congestion, sinus tenderness including dental pain, headache, mild sore throat now improving, coughing, sneezing. Body aches in last 24 to 48 hours. Tmax 99.5 -day of illness:5 days -Symptoms are improving as of today -previous treatments: advil cold and sinus.  -sick contacts/travel/risks: denies close flu exposure.   ROS-denies fever, SOB, NVD, tooth pain  Pertinent Past Medical History- There are no active problems to display for this patient.   Medications- reviewed  Current Outpatient Medications  Medication Sig Dispense Refill  . gabapentin (NEURONTIN) 300 MG capsule Take 1 capsule by mouth 3 (three) times daily.  0  . traZODone (DESYREL) 50 MG tablet Take 50 mg by mouth daily.  1  . valACYclovir (VALTREX) 500 MG tablet Take 500 mg by mouth daily.  4   No current facility-administered medications for this visit.     Objective: BP 124/72 (BP Location: Left Arm, Patient Position: Sitting, Cuff Size: Large)   Pulse 96   Temp 98.5 F (36.9 C) (Oral)   Ht 5\' 6"  (1.676 m)   Wt 200 lb (90.7 kg)   SpO2 96%   BMI 32.28 kg/m  Gen: NAD, resting comfortably HEENT: Turbinates erythematous with yellow drainage, TM normal, pharynx mildly erythematous with no tonsilar exudate or edema, both maxillary and frontal sinus tenderness CV: RRR no murmurs rubs or gallops Lungs: CTAB no crackles, wheeze, rhonchi Ext: no edema Skin: warm, dry, no rash  Assessment/Plan:  Sinsusitis Viral based on <10 days, no double sickening, lack of severity of symptoms in first 3 days. Educated on signs that bacterial infection may have developed (symptoms over 10 days, double sickening).   Patient states she has had recurrent sinus infections 1-2x a year but review of chart shows now antibiotics in epic for sinus infections.    Treatment: -considered steroid: we opted in- she will use 7 day course of prednisone- would start tomorrow morning -other symptomatic care with mucinex plain. Can also use the advil congestion medicine you have -Antibiotic indicated: will give printed pocket prescription for augmentin if symptoms last past 10 days or worsen at this point since you improved today  Finally, we reviewed reasons to return to care including if symptoms worsen or persist or new concerns arise (particularly fever or shortness of breath)  Meds ordered this encounter  Medications  . predniSONE (DELTASONE) 20 MG tablet    Sig: Take 1 tablet by mouth daily for 5 days, then 1/2 tablet daily for 2 days    Dispense:  6 tablet    Refill:  0  . amoxicillin-clavulanate (AUGMENTIN) 875-125 MG tablet    Sig: Take 1 tablet by mouth 2 (two) times daily for 7 days. If symptoms last past 10 days or worsen at this point.    Dispense:  14 tablet    Refill:  0  new acute issue with medication management  Garret Reddish, MD

## 2017-06-27 NOTE — Patient Instructions (Signed)
Sinsusitis Viral based on <10 days, no double sickening, lack of severity of symptoms in first 3 days. Educated on signs that bacterial infection may have developed (symptoms over 10 days, double sickening).   Treatment: -considered steroid: we opted in- she will use 7 day course of prednisone- would start tomorrow morning -other symptomatic care with mucinex plain. Can also use the advil congestion medicine you have -Antibiotic indicated: will give pocket prescription for augmentin if symptoms last past 10 days or worsen at this point since you improved today  Finally, we reviewed reasons to return to care including if symptoms worsen or persist or new concerns arise (particularly fever or shortness of breath)

## 2017-06-29 ENCOUNTER — Encounter: Payer: Self-pay | Admitting: Family Medicine

## 2017-12-26 ENCOUNTER — Encounter: Payer: Self-pay | Admitting: Family Medicine

## 2018-01-26 ENCOUNTER — Telehealth: Payer: Self-pay | Admitting: Gastroenterology

## 2018-01-26 NOTE — Telephone Encounter (Signed)
Pt calling to schedule colon. Please advise if ok to go ahead and schedule colonoscopy.

## 2018-01-26 NOTE — Telephone Encounter (Signed)
Please see note below from Dr. Silverio Decamp and schedule pt accordingly.

## 2018-01-26 NOTE — Telephone Encounter (Signed)
Based on my office note from last year, last colonoscopy was in 2009. Ok to schedule colonoscopy for colorectal cancer screening. Please schedule office visit if patient is having any current GI issues. Thanks

## 2018-02-14 ENCOUNTER — Ambulatory Visit: Payer: Self-pay | Admitting: *Deleted

## 2018-02-14 ENCOUNTER — Ambulatory Visit: Payer: Managed Care, Other (non HMO) | Admitting: Physician Assistant

## 2018-02-14 ENCOUNTER — Encounter: Payer: Self-pay | Admitting: Physician Assistant

## 2018-02-14 VITALS — BP 120/78 | HR 87 | Temp 99.2°F | Ht 66.0 in | Wt 201.0 lb

## 2018-02-14 DIAGNOSIS — J069 Acute upper respiratory infection, unspecified: Secondary | ICD-10-CM | POA: Diagnosis not present

## 2018-02-14 MED ORDER — ONDANSETRON HCL 4 MG PO TABS: 4.0000 mg | ORAL_TABLET | Freq: Three times a day (TID) | ORAL | 0 refills | Status: DC | PRN

## 2018-02-14 MED ORDER — AMOXICILLIN-POT CLAVULANATE 875-125 MG PO TABS: 1.0000 | ORAL_TABLET | Freq: Two times a day (BID) | ORAL | 0 refills | Status: DC

## 2018-02-14 NOTE — Patient Instructions (Signed)
It was great to see you!  Start the Augmentin antibiotic.  I have also sent in nausea medication for you to use as needed.  Push fluids and get plenty of rest. Please return if you are not improving as expected, or if you have high fevers (>101.5) or difficulty swallowing or worsening productive cough.  Call clinic with questions.  I hope you start feeling better soon!

## 2018-02-14 NOTE — Telephone Encounter (Signed)
Pt calling stating that she has a fever of 103.5 today and was seen for OV today with Wanda Plump and diagnosed with an upper respiratory infection. Pt states she was advised to contact office if fever was greater than 101.5. Pt states she did take 2-500 mg tab right before calling the office. Pt advised to recheck temperature in about an hour and to make sure she tried to stat hydrated. Pt advised that if temperature did not decrease after Tylenol and symptoms became worse to seek treatment at Urgent Care tonight. Pt verbalized understanding.   Reason for Disposition . [1] Fever AND [2] no signs of serious infection or localizing symptoms (all other triage questions negative)  Answer Assessment - Initial Assessment Questions 1. TEMPERATURE: "What is the most recent temperature?"  "How was it measured?"      103.5 2. ONSET: "When did the fever start?"      Prior to calling the office 3. SYMPTOMS: "Do you have any other symptoms besides the fever?"  (e.g., colds, headache, sore throat, earache, cough, rash, diarrhea, vomiting, abdominal pain)     Terrible headaches 4. CAUSE: If there are no symptoms, ask: "What do you think is causing the fever?"      Pt was seen for OV today and diagnosed with upper resp infection 5. CONTACTS: "Does anyone else in the family have an infection?"     n/a 6. TREATMENT: "What have you done so far to treat this fever?" (e.g., medications)     2-500 mg Tylenol tab right before calling the office 7. IMMUNOCOMPROMISE: "Do you have of the following: diabetes, HIV positive, splenectomy, cancer chemotherapy, chronic steroid treatment, transplant patient, etc."     Not assessed 8. PREGNANCY: "Is there any chance you are pregnant?" "When was your last menstrual period?"     Not assessed 9. TRAVEL: "Have you traveled out of the country in the last month?" (e.g., travel history, exposures) Not assessed  Protocols used: FEVER-A-AH

## 2018-02-14 NOTE — Progress Notes (Signed)
Sarah Simpson is a 52 y.o. female here for a new problem.   History of Present Illness:   Chief Complaint  Patient presents with  . poss flu    fever, body aches, weak, headache,chills    HPI   Yesterday had onset of body aches, fever, fatigue, sweating. Not sleeping well. Has not been eating well, had a little bit of nausea. Had a bad headache and took Excedrin Migraine and felt relief with that. Pushing fluids.  Has significant hx of PNA x 2, both times treated as outpatient. Has had some dry cough and sinus pressure as well. Denies changes or symptoms with urination. Denies CP, SOB, diarrhea. Endorses tooth pain on upper teeth, consistent with past episodes of sinusitis.  Past Medical History:  Diagnosis Date  . ADD (attention deficit disorder)   . Cyst of right ovary   . Depression   . Kidney stone   . Restless leg syndrome      Social History   Socioeconomic History  . Marital status: Single    Spouse name: Not on file  . Number of children: Not on file  . Years of education: Not on file  . Highest education level: Not on file  Occupational History  . Not on file  Social Needs  . Financial resource strain: Not on file  . Food insecurity:    Worry: Not on file    Inability: Not on file  . Transportation needs:    Medical: Not on file    Non-medical: Not on file  Tobacco Use  . Smoking status: Former Research scientist (life sciences)  . Smokeless tobacco: Never Used  . Tobacco comment: on and off in the past, quit in 2002  Substance and Sexual Activity  . Alcohol use: Yes    Comment: occ  . Drug use: Not on file  . Sexual activity: Not on file  Lifestyle  . Physical activity:    Days per week: Not on file    Minutes per session: Not on file  . Stress: Not on file  Relationships  . Social connections:    Talks on phone: Not on file    Gets together: Not on file    Attends religious service: Not on file    Active member of club or organization: Not on file    Attends meetings  of clubs or organizations: Not on file    Relationship status: Not on file  . Intimate partner violence:    Fear of current or ex partner: Not on file    Emotionally abused: Not on file    Physically abused: Not on file    Forced sexual activity: Not on file  Other Topics Concern  . Not on file  Social History Narrative   Work or School: Biochemist, clinical - [place] Situation: none      Spiritual Beliefs: Christian      Lifestyle: no regular exercise; so so             Past Surgical History:  Procedure Laterality Date  . APPENDECTOMY  2008  . BUNIONECTOMY  2008    Family History  Problem Relation Age of Onset  . Colon cancer Father 78  . Colon cancer Mother 46  . Heart attack Mother   . Dementia Mother     No Known Allergies  Current Medications:   Current Outpatient Medications:  .  diphenhydrAMINE (BENADRYL) 25 mg capsule, Take 25 mg by mouth every 6 (  six) hours as needed., Disp: , Rfl:  .  gabapentin (NEURONTIN) 300 MG capsule, Take 1 capsule by mouth 3 (three) times daily., Disp: , Rfl: 0 .  Melatonin 3 MG TABS, Take by mouth., Disp: , Rfl:  .  traZODone (DESYREL) 50 MG tablet, Take 50 mg by mouth daily., Disp: , Rfl: 1 .  valACYclovir (VALTREX) 500 MG tablet, Take 500 mg by mouth daily., Disp: , Rfl: 4 .  amoxicillin-clavulanate (AUGMENTIN) 875-125 MG tablet, Take 1 tablet by mouth 2 (two) times daily., Disp: 20 tablet, Rfl: 0 .  ondansetron (ZOFRAN) 4 MG tablet, Take 1 tablet (4 mg total) by mouth every 8 (eight) hours as needed for nausea or vomiting., Disp: 20 tablet, Rfl: 0   Review of Systems:   ROS  Negative unless otherwise specified per HPI.  Vitals:   Vitals:   02/14/18 1307  BP: 120/78  Pulse: 87  Temp: 99.2 F (37.3 C)  TempSrc: Oral  SpO2: 98%  Weight: 201 lb (91.2 kg)  Height: 5\' 6"  (1.676 m)     Body mass index is 32.44 kg/m.  Physical Exam:   Physical Exam  Constitutional: She appears well-developed. She is cooperative.   Non-toxic appearance. She does not have a sickly appearance. She does not appear ill. No distress.  HENT:  Head: Normocephalic and atraumatic.  Right Ear: Tympanic membrane, external ear and ear canal normal. Tympanic membrane is not erythematous, not retracted and not bulging.  Left Ear: Tympanic membrane, external ear and ear canal normal. Tympanic membrane is not erythematous, not retracted and not bulging.  Nose: Mucosal edema and rhinorrhea present. Right sinus exhibits frontal sinus tenderness. Right sinus exhibits no maxillary sinus tenderness. Left sinus exhibits frontal sinus tenderness. Left sinus exhibits no maxillary sinus tenderness.  Mouth/Throat: Uvula is midline. Posterior oropharyngeal edema present. No posterior oropharyngeal erythema.  Trace clear fluid bilateral TM  Eyes: Conjunctivae and lids are normal.  Neck: Trachea normal.  Cardiovascular: Normal rate, regular rhythm, S1 normal, S2 normal and normal heart sounds.  Pulmonary/Chest: Effort normal and breath sounds normal. She has no decreased breath sounds. She has no wheezes. She has no rhonchi. She has no rales.  Lymphadenopathy:    She has no cervical adenopathy.  Neurological: She is alert.  Skin: Skin is warm, dry and intact.  Psychiatric: She has a normal mood and affect. Her speech is normal and behavior is normal.  Nursing note and vitals reviewed.   Assessment and Plan:    Sarah Simpson was seen today for poss flu.  Diagnoses and all orders for this visit:  Upper respiratory tract infection, unspecified type No red flags on exam.  Will initiate Augmentin per orders. I also prescribed Zofran to help with nausea if needed. Discussed taking medications as prescribed. Reviewed return precautions including worsening fever, SOB, worsening cough or other concerns. Push fluids and rest. I recommend that patient follow-up if symptoms worsen or persist despite treatment x 7-10 days, sooner if needed.  Other orders -      amoxicillin-clavulanate (AUGMENTIN) 875-125 MG tablet; Take 1 tablet by mouth 2 (two) times daily. -     ondansetron (ZOFRAN) 4 MG tablet; Take 1 tablet (4 mg total) by mouth every 8 (eight) hours as needed for nausea or vomiting.    . Reviewed expectations re: course of current medical issues. . Discussed self-management of symptoms. . Outlined signs and symptoms indicating need for more acute intervention. . Patient verbalized understanding and all questions were answered. Marland Kitchen  See orders for this visit as documented in the electronic medical record. . Patient received an After-Visit Summary.    Inda Coke, PA-C

## 2018-02-15 ENCOUNTER — Ambulatory Visit: Payer: Self-pay | Admitting: Family Medicine

## 2018-02-15 NOTE — Telephone Encounter (Signed)
Monitor for either ER or Urgent arrival, follow up with pt.

## 2018-02-15 NOTE — Telephone Encounter (Signed)
Patient is reporting she is having fluctuating temperatures- up to 103.5 during the evening. Patient did manage to get her temperature to come down- but she states she is having trouble with her breathing. Patient is having trouble getting breaths in- labored breathing can be heard over the phone- triage call stopped and patient advised to call 911- patient refused to call 911 and she states she is going to drive herself to [facility]- advised patient against that. Call to office to report concern for patient welfare.   Reason for Disposition . Severe difficulty breathing (e.g., struggling for each breath, speaks in single words)  Answer Assessment - Initial Assessment Questions 1. RESPIRATORY STATUS: "Describe your breathing?" (e.g., wheezing, shortness of breath, unable to speak, severe coughing)      Shortness of breath 2. ONSET: "When did this breathing problem begin?"      Yesterday after she left ofice 3. PATTERN "Does the difficult breathing come and go, or has it been constant since it started?"      *No Answer* 4. SEVERITY: "How bad is your breathing?" (e.g., mild, moderate, severe)    - MILD: No SOB at rest, mild SOB with walking, speaks normally in sentences, can lay down, no retractions, pulse < 100.    - MODERATE: SOB at rest, SOB with minimal exertion and prefers to sit, cannot lie down flat, speaks in phrases, mild retractions, audible wheezing, pulse 100-120.    - SEVERE: Very SOB at rest, speaks in single words, struggling to breathe, sitting hunched forward, retractions, pulse > 120      *No Answer* 5. RECURRENT SYMPTOM: "Have you had difficulty breathing before?" If so, ask: "When was the last time?" and "What happened that time?"      *No Answer* 6. CARDIAC HISTORY: "Do you have any history of heart disease?" (e.g., heart attack, angina, bypass surgery, angioplasty)      *No Answer* 7. LUNG HISTORY: "Do you have any history of lung disease?"  (e.g., pulmonary embolus, asthma,  emphysema)     *No Answer* 8. CAUSE: "What do you think is causing the breathing problem?"      *No Answer* 9. OTHER SYMPTOMS: "Do you have any other symptoms? (e.g., dizziness, runny nose, cough, chest pain, fever)     *No Answer* 10. PREGNANCY: "Is there any chance you are pregnant?" "When was your last menstrual period?"       *No Answer* 11. TRAVEL: "Have you traveled out of the country in the last month?" (e.g., travel history, exposures)       *No Answer*  Protocols used: BREATHING DIFFICULTY-A-AH

## 2018-02-16 ENCOUNTER — Emergency Department (HOSPITAL_COMMUNITY)
Admission: EM | Admit: 2018-02-16 | Discharge: 2018-02-16 | Disposition: A | Discharge status: Home / Self Care | Payer: Managed Care, Other (non HMO) | Attending: Emergency Medicine | Admitting: Emergency Medicine

## 2018-02-16 ENCOUNTER — Encounter (HOSPITAL_COMMUNITY): Payer: Self-pay | Admitting: Emergency Medicine

## 2018-02-16 ENCOUNTER — Other Ambulatory Visit: Payer: Self-pay

## 2018-02-16 ENCOUNTER — Emergency Department (HOSPITAL_COMMUNITY): Payer: Managed Care, Other (non HMO)

## 2018-02-16 DIAGNOSIS — Z79899 Other long term (current) drug therapy: Secondary | ICD-10-CM | POA: Insufficient documentation

## 2018-02-16 DIAGNOSIS — Z87891 Personal history of nicotine dependence: Secondary | ICD-10-CM | POA: Insufficient documentation

## 2018-02-16 DIAGNOSIS — R51 Headache: Secondary | ICD-10-CM | POA: Diagnosis not present

## 2018-02-16 DIAGNOSIS — R0602 Shortness of breath: Secondary | ICD-10-CM | POA: Diagnosis present

## 2018-02-16 DIAGNOSIS — J189 Pneumonia, unspecified organism: Secondary | ICD-10-CM

## 2018-02-16 DIAGNOSIS — J181 Lobar pneumonia, unspecified organism: Secondary | ICD-10-CM | POA: Insufficient documentation

## 2018-02-16 MED ORDER — KETOROLAC TROMETHAMINE 15 MG/ML IJ SOLN
15.0000 mg | Freq: Once | INTRAMUSCULAR | Status: AC
  Administered 2018-02-16: 15 mg via INTRAVENOUS
  Filled 2018-02-16: qty 1

## 2018-02-16 MED ORDER — ALBUTEROL SULFATE (2.5 MG/3ML) 0.083% IN NEBU
5.0000 mg | INHALATION_SOLUTION | Freq: Once | RESPIRATORY_TRACT | Status: AC
  Administered 2018-02-16: 5 mg via RESPIRATORY_TRACT
  Filled 2018-02-16: qty 6

## 2018-02-16 MED ORDER — LEVOFLOXACIN 750 MG PO TABS
750.0000 mg | ORAL_TABLET | Freq: Once | ORAL | Status: AC
  Administered 2018-02-16: 750 mg via ORAL
  Filled 2018-02-16: qty 1

## 2018-02-16 MED ORDER — DIPHENHYDRAMINE HCL 50 MG/ML IJ SOLN
12.5000 mg | Freq: Once | INTRAMUSCULAR | Status: AC
  Administered 2018-02-16: 12.5 mg via INTRAVENOUS
  Filled 2018-02-16: qty 1

## 2018-02-16 MED ORDER — LEVOFLOXACIN 500 MG PO TABS: 500.0000 mg | ORAL_TABLET | Freq: Every day | ORAL | 0 refills | Status: DC

## 2018-02-16 MED ORDER — HYDROCOD POLST-CPM POLST ER 10-8 MG/5ML PO SUER
5.0000 mL | Freq: Once | ORAL | Status: AC
  Administered 2018-02-16: 5 mL via ORAL
  Filled 2018-02-16: qty 5

## 2018-02-16 MED ORDER — PROCHLORPERAZINE EDISYLATE 10 MG/2ML IJ SOLN
10.0000 mg | Freq: Once | INTRAMUSCULAR | Status: AC
  Administered 2018-02-16: 10 mg via INTRAVENOUS
  Filled 2018-02-16: qty 2

## 2018-02-16 MED ORDER — SODIUM CHLORIDE 0.9 % IV BOLUS
1000.0000 mL | Freq: Once | INTRAVENOUS | Status: AC
  Administered 2018-02-16: 1000 mL via INTRAVENOUS

## 2018-02-16 MED ORDER — IPRATROPIUM-ALBUTEROL 0.5-2.5 (3) MG/3ML IN SOLN
3.0000 mL | Freq: Once | RESPIRATORY_TRACT | Status: AC
  Administered 2018-02-16: 3 mL via RESPIRATORY_TRACT
  Filled 2018-02-16: qty 3

## 2018-02-16 NOTE — ED Provider Notes (Signed)
Patient placed in Quick Look pathway, seen and evaluated   Chief Complaint: shortness of breath  HPI:   Sarah Simpson is a 52 y.o. female Pt reports being dx with pneumonia and given antibiotics on Wednesday. Pt reports nausea, fever chills, SOB, CP, and headache. Pt reports highest temp at home was 103.5. Pt reports relief with tylenol, last took at 10 am. Pt reports she has also developed a rash to arms and stomach since taking amoxicillin.  ROS: Resp: cough, congestion  Skin: rash  GI: n/v  Physical Exam:  BP 102/77   Pulse 93   Temp 99.5 F (37.5 C) (Oral)   Resp 18   Ht 5\' 6"  (1.676 m)   Wt 90.7 kg   SpO2 99%   BMI 32.28 kg/m    Gen: No distress  Neuro: Awake and Alert  Skin: Warm and dry  Heart: regular rate and rhythm  Lungs: decreased BS, occasional rales right      Initiation of care has begun. The patient has been counseled on the process, plan, and necessity for staying for the completion/evaluation, and the remainder of the medical screening examination    Ashley Murrain, NP 02/16/18 [place], Kevin, MD 02/20/18 [phone]

## 2018-02-16 NOTE — ED Triage Notes (Signed)
Pt reports being dx with bronchitis and given antibiotics on Wednesday. Pt reports nausea, fever chills, SHOB, CP, and headache. Pt reports highest temp at home was 103.5. Pt reports relief with tylenol, last took at 10 am. Pt reports she has also developed a rash to arms and stomach since taking amoxicillin.

## 2018-02-16 NOTE — ED Provider Notes (Signed)
[facility] EMERGENCY DEPARTMENT Provider Note   CSN: [id number] Arrival date & time: 02/16/18  1559     History   Chief Complaint Chief Complaint  Patient presents with  . Shortness of Breath  . Fever    HPI Sarah Simpson is a 52 y.o. female.  The history is provided by the patient.  Headache   This is a new problem. The current episode started 12 to 24 hours ago. The problem occurs constantly. The problem has not changed since onset.The headache is associated with nothing. The pain is located in the temporal, bilateral and frontal region. The quality of the pain is described as dull. The pain is severe. Associated symptoms include a fever (resolved yesterday), shortness of breath and nausea. Pertinent negatives include no palpitations and no vomiting. She has tried NSAIDs for the symptoms. The treatment provided mild relief.    Past Medical History:  Diagnosis Date  . ADD (attention deficit disorder)   . Cyst of right ovary   . Depression   . Kidney stone   . Restless leg syndrome     There are no active problems to display for this patient.   Past Surgical History:  Procedure Laterality Date  . APPENDECTOMY  2008  . BUNIONECTOMY  2008     OB History   None      Home Medications    Prior to Admission medications   Medication Sig Start Date End Date Taking? Authorizing Provider  amoxicillin-clavulanate (AUGMENTIN) 875-125 MG tablet Take 1 tablet by mouth 2 (two) times daily. 02/14/18   Inda Coke, PA  diphenhydrAMINE (BENADRYL) 25 mg capsule Take 25 mg by mouth every 6 (six) hours as needed.    [provider]  gabapentin (NEURONTIN) 300 MG capsule Take 1 capsule by mouth 3 (three) times daily. 05/26/17   [provider]  levofloxacin (LEVAQUIN) 500 MG tablet Take 1 tablet (500 mg total) by mouth daily. 02/16/18   Irven Baltimore, MD  Melatonin 3 MG TABS Take by mouth.    [provider]  ondansetron  (ZOFRAN) 4 MG tablet Take 1 tablet (4 mg total) by mouth every 8 (eight) hours as needed for nausea or vomiting. 02/14/18   Inda Coke, PA  traZODone (DESYREL) 50 MG tablet Take 50 mg by mouth daily. 05/02/17   [provider]  valACYclovir (VALTREX) 500 MG tablet Take 500 mg by mouth daily. 04/05/17   [provider]    Family History Family History  Problem Relation Age of Onset  . Colon cancer Father 68  . Colon cancer Mother 25  . Heart attack Mother   . Dementia Mother     Social History Social History   Tobacco Use  . Smoking status: Former Research scientist (life sciences)  . Smokeless tobacco: Never Used  . Tobacco comment: on and off in the past, quit in 2002  Substance Use Topics  . Alcohol use: Yes    Comment: occ  . Drug use: Not on file     Allergies   Patient has no known allergies.   Review of Systems Review of Systems  Constitutional: Positive for fever (resolved yesterday).  HENT: Positive for congestion. Negative for ear pain.   Eyes: Negative for pain.  Respiratory: Positive for cough and shortness of breath.   Cardiovascular: Negative for palpitations.  Gastrointestinal: Positive for nausea. Negative for abdominal pain and vomiting.  Genitourinary: Negative for dysuria, frequency and urgency.  Musculoskeletal: Positive for arthralgias and myalgias.  Skin:  Positive for rash (pruritic rash after taking amoxicillin, now resolved).  Neurological: Positive for headaches.  Psychiatric/Behavioral: Negative for confusion.     Physical Exam Updated Vital Signs BP 101/66   Pulse 74   Temp 98.8 F (37.1 C) (Oral)   Resp 17   Ht 5\' 6"  (1.676 m)   Wt 90.7 kg   SpO2 95%   BMI 32.28 kg/m   Physical Exam  Constitutional: She is oriented to person, place, and time. She appears well-developed and well-nourished. No distress.  HENT:  Head: Normocephalic and atraumatic.  Eyes: Conjunctivae are normal.  Neck: Neck supple.  Cardiovascular: Normal rate and  regular rhythm.  No murmur heard. Pulmonary/Chest: Effort normal and breath sounds normal. No respiratory distress. She has no decreased breath sounds. She has no wheezes.  Abdominal: Soft. There is no tenderness.  Musculoskeletal: She exhibits no edema.  Neurological: She is alert and oriented to person, place, and time.  5/5 strength bilateral upper and lower extremities, intact sensation, 2+ patellar and brachial reflexes, no ankle clonus, finger to nose normal.  Skin: Skin is warm and dry.  Psychiatric: She has a normal mood and affect.  Nursing note and vitals reviewed.    ED Treatments / Results  Labs (all labs ordered are listed, but only abnormal results are displayed) Labs Reviewed - No data to display  EKG EKG Interpretation  Date/Time:  Friday February 16 2018 16:24:05 EDT Ventricular Rate:  91 PR Interval:  150 QRS Duration: 76 QT Interval:  342 QTC Calculation: 420 R Axis:   16 Text Interpretation:  Normal sinus rhythm Low voltage QRS Septal infarct , age undetermined Abnormal ECG No old tracing to compare Confirmed by Jola Schmidt [phone]) on 02/16/2018 10:12:46 PM Also confirmed by Jola Schmidt [phone]), editor Lynder Parents [phone])  on 02/17/2018 12:40:34 PM   Radiology Dg Chest 2 View  Result Date: 02/16/2018 CLINICAL DATA:  Diagnosed with pneumonia, worsened since taking amoxicillin. Dyspnea and cough. EXAM: CHEST - 2 VIEW COMPARISON:  None. FINDINGS: The heart size and mediastinal contours are within normal limits. Subtle pulmonary opacity in the right lower lobe suspicious for pneumonia and/or atelectasis. This is best seen on the frontal view. No pneumothorax or pleural effusion. The visualized skeletal structures are unremarkable. IMPRESSION: Subtle opacity at the right lung base suspicious for pneumonia and/or atelectasis. Electronically Signed   By: Ashley Royalty M.D.   On: 02/16/2018 17:51    Procedures Procedures (including critical care  time)  Medications Ordered in ED Medications  albuterol (PROVENTIL) (2.5 MG/3ML) 0.083% nebulizer solution 5 mg (5 mg Nebulization Given 02/16/18 2042)  ipratropium-albuterol (DUONEB) 0.5-2.5 (3) MG/3ML nebulizer solution 3 mL (3 mLs Nebulization Given 02/16/18 1702)  chlorpheniramine-HYDROcodone (TUSSIONEX) 10-8 MG/5ML suspension 5 mL (5 mLs Oral Given 02/16/18 2041)  prochlorperazine (COMPAZINE) injection 10 mg (10 mg Intravenous Given 02/16/18 2210)  diphenhydrAMINE (BENADRYL) injection 12.5 mg (12.5 mg Intravenous Given 02/16/18 2210)  sodium chloride 0.9 % bolus 1,000 mL (0 mLs Intravenous Stopped 02/16/18 2302)  ketorolac (TORADOL) 15 MG/ML injection 15 mg (15 mg Intravenous Given 02/16/18 2210)  levofloxacin (LEVAQUIN) tablet 750 mg (750 mg Oral Given 02/16/18 2302)     Initial Impression / Assessment and Plan / ED Course  I have reviewed the triage vital signs and the nursing notes.  Pertinent labs & imaging results that were available during my care of the patient were reviewed by me and considered in my medical decision making (see chart for details).  The patient is a 80yoF with PMH depression and nephrolithiasis who presents with headaches, shortness of breath, fevers, chills, nausea, and cough. The patient was diagnosed with viral infection vs PNA by her PCP and was put on amoxicillin 3 days ago. She has had resolution of her fever, and shortness of breath improved after duo-neb.  The patient has clear lung sounds.  She still endorses a frontal headache that radiates to both temples and feels like a pressure of her forehead.  She is given Compazine, Benadryl, Toradol, and a normal saline bolus and reports resolution of her headache, nausea, and myalgias.  The patient was given a dose of Levaquin and discharged with Levaquin for her pneumonia.  The patient is instructed to continue her amoxicillin and follow-up with her primary care provider.  Patient is given return precautions.  The  patient endorses understanding of her discharge instructions.  She agrees with the plan.  Patient care supervised by Dr. Venora Maples.  Irven Baltimore, MD  Final Clinical Impressions(s) / ED Diagnoses   Final diagnoses:  Community acquired pneumonia of right middle lobe of lung [facility])    ED Discharge Orders         Ordered    levofloxacin (LEVAQUIN) 500 MG tablet  Daily     02/16/18 2247           Irven Baltimore, MD 02/17/18 [place], MD 02/19/18 2318

## 2018-02-16 NOTE — ED Notes (Signed)
Pt recently diagnosed with resp infection

## 2018-02-16 NOTE — Telephone Encounter (Signed)
I called and left a voice message for pt to return my call, checking on status of pt and how she is doing now.

## 2018-02-16 NOTE — ED Notes (Signed)
ED Provider at bedside. 

## 2018-02-20 NOTE — Telephone Encounter (Signed)
Spoke w/pt and will call once the calender for November comes available

## 2018-02-20 NOTE — Telephone Encounter (Signed)
I confirmed ER arrival on 02/16/2018.

## 2018-03-08 NOTE — Telephone Encounter (Signed)
Patient states since we "don't seem to be in a hurry to get her scheduled, she will go somewhere else".

## 2018-04-18 LAB — HM MAMMOGRAPHY

## 2018-05-03 ENCOUNTER — Encounter: Payer: Self-pay | Admitting: Family Medicine

## 2018-08-06 ENCOUNTER — Encounter: Payer: Self-pay | Admitting: Gastroenterology

## 2018-09-03 ENCOUNTER — Encounter: Payer: Self-pay | Admitting: Gastroenterology

## 2018-09-03 ENCOUNTER — Other Ambulatory Visit: Payer: Self-pay

## 2018-09-03 ENCOUNTER — Ambulatory Visit (AMBULATORY_SURGERY_CENTER): Payer: Self-pay | Admitting: *Deleted

## 2018-09-03 VITALS — Temp 97.9°F | Ht 66.0 in | Wt 194.0 lb

## 2018-09-03 DIAGNOSIS — Z800 Family history of malignant neoplasm of digestive organs: Secondary | ICD-10-CM

## 2018-09-03 MED ORDER — NA SULFATE-K SULFATE-MG SULF 17.5-3.13-1.6 GM/177ML PO SOLN: 1.0000 | Freq: Once | ORAL | 0 refills | Status: AC

## 2018-09-03 NOTE — Progress Notes (Signed)
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

## 2018-09-14 ENCOUNTER — Encounter: Payer: Managed Care, Other (non HMO) | Admitting: Gastroenterology

## 2018-09-18 ENCOUNTER — Encounter: Payer: Managed Care, Other (non HMO) | Admitting: Gastroenterology

## 2018-09-20 ENCOUNTER — Telehealth: Payer: Self-pay | Admitting: Gastroenterology

## 2018-09-20 NOTE — Telephone Encounter (Signed)
Patient is advised. Recall entered.

## 2018-09-20 NOTE — Telephone Encounter (Signed)
Pt had canceled her colonoscopy schedule 3.31.20 but would like to reschedule due to job loss.  She would like to schedule within the next few weeks while her insurance is still effective.  Pt stated that she is high risk--both parents had colon cancer.  Please advise.

## 2018-09-20 NOTE — Telephone Encounter (Signed)
Please advise 

## 2018-09-20 NOTE — Telephone Encounter (Signed)
Unfortunately due to COVID-19, we are not scheduling any elective screening colonoscopy at this point based on recommendation and guidelines from all GI medical Society and [place].  Please place a reminder to contact patient in 4 to 6 weeks to schedule the procedure as soon as the restrictions are lifted.  Thank you

## 2018-10-09 ENCOUNTER — Telehealth: Payer: Self-pay | Admitting: Gastroenterology

## 2018-10-09 NOTE — Telephone Encounter (Signed)
Patient insurance is expiring at the end of April and would like to know if she can have her Direct Colonoscopy before it expires.

## 2018-10-10 NOTE — Telephone Encounter (Signed)
Left message on patients voicemail per Dr. Woodward Ku response.  Told patient to call back if any further questions.

## 2018-10-10 NOTE — Telephone Encounter (Signed)
I am sorry, because of restrictions from the state and health system we cannot proceed with elective procedures at this time.

## 2018-10-24 ENCOUNTER — Telehealth: Payer: Self-pay | Admitting: *Deleted

## 2018-10-24 NOTE — Telephone Encounter (Signed)
Ok, thank you

## 2018-10-24 NOTE — Telephone Encounter (Signed)
Spoke with patient and offered her a colonoscopy spot on Monday 5/11 to have her procedure done but patient declined stating she no longer had insurance because she lost her job but Thanked [place] for thinking of her    FYI Dr Silverio Decamp

## 2019-05-20 ENCOUNTER — Encounter: Payer: Self-pay | Admitting: Gastroenterology

## 2019-09-05 ENCOUNTER — Ambulatory Visit: Payer: Self-pay | Attending: Internal Medicine

## 2019-09-05 DIAGNOSIS — Z23 Encounter for immunization: Secondary | ICD-10-CM

## 2019-09-05 NOTE — Progress Notes (Signed)
   Covid-19 Vaccination Clinic  Name:  Sarah Simpson    MRN: [id number] DOB: [birth year]  09/05/2019  [name] was observed post Covid-19 immunization for 15 minutes without incident. She was provided with Vaccine Information Sheet and instruction to access the V-Safe system.   [name] was instructed to call 911 with any severe reactions post vaccine: Marland Kitchen Difficulty breathing  . Swelling of face and throat  . A fast heartbeat  . A bad rash all over body  . Dizziness and weakness   Immunizations Administered    Name Date Dose VIS Date Route   Pfizer COVID-19 Vaccine 09/05/2019  8:43 AM 0.3 mL 05/31/2019 Intramuscular   Manufacturer: [place]   Lot: [id number]   [place]: [id number]

## 2019-09-30 ENCOUNTER — Ambulatory Visit: Payer: Self-pay | Attending: Internal Medicine

## 2019-09-30 DIAGNOSIS — Z23 Encounter for immunization: Secondary | ICD-10-CM

## 2019-09-30 NOTE — Progress Notes (Signed)
   Covid-19 Vaccination Clinic  Name:  Sarah Simpson    MRN: [id number] DOB: [birth year]  09/30/2019  Sarah Simpson was observed post Covid-19 immunization for 15 minutes without incident. She was provided with Vaccine Information Sheet and instruction to access the V-Safe system.   Sarah Simpson was instructed to call 911 with any severe reactions post vaccine: Marland Kitchen Difficulty breathing  . Swelling of face and throat  . A fast heartbeat  . A bad rash all over body  . Dizziness and weakness   Immunizations Administered    Name Date Dose VIS Date Route   Pfizer COVID-19 Vaccine 09/30/2019  8:32 AM 0.3 mL 05/31/2019 Intramuscular   Manufacturer: [place]   Lot: [id number]   [place]: [id number]

## 2020-09-15 ENCOUNTER — Emergency Department (HOSPITAL_COMMUNITY): Payer: Self-pay

## 2020-09-15 ENCOUNTER — Inpatient Hospital Stay (HOSPITAL_COMMUNITY)
Admission: EM | Admit: 2020-09-15 | Discharge: 2020-09-18 | DRG: 066 | Disposition: A | Discharge status: Home / Self Care | Payer: Self-pay | Attending: Internal Medicine | Admitting: Internal Medicine

## 2020-09-15 ENCOUNTER — Other Ambulatory Visit: Payer: Self-pay

## 2020-09-15 ENCOUNTER — Ambulatory Visit (HOSPITAL_COMMUNITY)
Admission: EM | Admit: 2020-09-15 | Discharge: 2020-09-15 | Disposition: A | Discharge status: Home / Self Care | Payer: Self-pay

## 2020-09-15 ENCOUNTER — Encounter (HOSPITAL_COMMUNITY): Payer: Self-pay | Admitting: Emergency Medicine

## 2020-09-15 DIAGNOSIS — R29810 Facial weakness: Secondary | ICD-10-CM | POA: Diagnosis present

## 2020-09-15 DIAGNOSIS — R202 Paresthesia of skin: Secondary | ICD-10-CM | POA: Diagnosis present

## 2020-09-15 DIAGNOSIS — I639 Cerebral infarction, unspecified: Secondary | ICD-10-CM | POA: Diagnosis present

## 2020-09-15 DIAGNOSIS — R278 Other lack of coordination: Secondary | ICD-10-CM | POA: Diagnosis present

## 2020-09-15 DIAGNOSIS — I6389 Other cerebral infarction: Principal | ICD-10-CM | POA: Diagnosis present

## 2020-09-15 DIAGNOSIS — Z882 Allergy status to sulfonamides status: Secondary | ICD-10-CM

## 2020-09-15 DIAGNOSIS — Z881 Allergy status to other antibiotic agents status: Secondary | ICD-10-CM

## 2020-09-15 DIAGNOSIS — Z79890 Hormone replacement therapy (postmenopausal): Secondary | ICD-10-CM

## 2020-09-15 DIAGNOSIS — F419 Anxiety disorder, unspecified: Secondary | ICD-10-CM | POA: Diagnosis present

## 2020-09-15 DIAGNOSIS — E785 Hyperlipidemia, unspecified: Secondary | ICD-10-CM | POA: Diagnosis present

## 2020-09-15 DIAGNOSIS — Z6835 Body mass index (BMI) 35.0-35.9, adult: Secondary | ICD-10-CM

## 2020-09-15 DIAGNOSIS — Z888 Allergy status to other drugs, medicaments and biological substances status: Secondary | ICD-10-CM

## 2020-09-15 DIAGNOSIS — I10 Essential (primary) hypertension: Secondary | ICD-10-CM | POA: Diagnosis present

## 2020-09-15 DIAGNOSIS — X58XXXA Exposure to other specified factors, initial encounter: Secondary | ICD-10-CM | POA: Diagnosis present

## 2020-09-15 DIAGNOSIS — G43909 Migraine, unspecified, not intractable, without status migrainosus: Secondary | ICD-10-CM | POA: Diagnosis present

## 2020-09-15 DIAGNOSIS — E669 Obesity, unspecified: Secondary | ICD-10-CM | POA: Diagnosis present

## 2020-09-15 DIAGNOSIS — Z8249 Family history of ischemic heart disease and other diseases of the circulatory system: Secondary | ICD-10-CM

## 2020-09-15 DIAGNOSIS — R4701 Aphasia: Secondary | ICD-10-CM | POA: Diagnosis present

## 2020-09-15 DIAGNOSIS — T465X6A Underdosing of other antihypertensive drugs, initial encounter: Secondary | ICD-10-CM | POA: Diagnosis present

## 2020-09-15 DIAGNOSIS — G2581 Restless legs syndrome: Secondary | ICD-10-CM | POA: Diagnosis present

## 2020-09-15 DIAGNOSIS — Z800 Family history of malignant neoplasm of digestive organs: Secondary | ICD-10-CM

## 2020-09-15 DIAGNOSIS — Z20822 Contact with and (suspected) exposure to covid-19: Secondary | ICD-10-CM | POA: Diagnosis present

## 2020-09-15 DIAGNOSIS — Z79899 Other long term (current) drug therapy: Secondary | ICD-10-CM

## 2020-09-15 DIAGNOSIS — Z87891 Personal history of nicotine dependence: Secondary | ICD-10-CM

## 2020-09-15 DIAGNOSIS — Z91120 Patient's intentional underdosing of medication regimen due to financial hardship: Secondary | ICD-10-CM

## 2020-09-15 DIAGNOSIS — R21 Rash and other nonspecific skin eruption: Secondary | ICD-10-CM | POA: Diagnosis present

## 2020-09-15 DIAGNOSIS — R29702 NIHSS score 2: Secondary | ICD-10-CM | POA: Diagnosis present

## 2020-09-15 HISTORY — DX: Essential (primary) hypertension: I10

## 2020-09-15 HISTORY — DX: Personal history of urinary calculi: Z87.442

## 2020-09-15 LAB — DIFFERENTIAL
Abs Immature Granulocytes: 0.01 10*3/uL (ref 0.00–0.07)
Basophils Absolute: 0.1 10*3/uL (ref 0.0–0.1)
Basophils Relative: 1 %
Eosinophils Absolute: 0.9 10*3/uL — ABNORMAL HIGH (ref 0.0–0.5)
Eosinophils Relative: 15 %
Immature Granulocytes: 0 %
Lymphocytes Relative: 35 %
Lymphs Abs: 2.1 10*3/uL (ref 0.7–4.0)
Monocytes Absolute: 0.5 10*3/uL (ref 0.1–1.0)
Monocytes Relative: 9 %
Neutro Abs: 2.3 10*3/uL (ref 1.7–7.7)
Neutrophils Relative %: 40 %

## 2020-09-15 LAB — COMPREHENSIVE METABOLIC PANEL
ALT: 54 U/L — ABNORMAL HIGH (ref 0–44)
AST: 38 U/L (ref 15–41)
Albumin: 4.2 g/dL (ref 3.5–5.0)
Alkaline Phosphatase: 43 U/L (ref 38–126)
Anion gap: 9 (ref 5–15)
BUN: 12 mg/dL (ref 6–20)
CO2: 26 mmol/L (ref 22–32)
Calcium: 9.4 mg/dL (ref 8.9–10.3)
Chloride: 105 mmol/L (ref 98–111)
Creatinine, Ser: 0.57 mg/dL (ref 0.44–1.00)
GFR, Estimated: >60 mL/min (ref >60)
Glucose, Bld: 111 mg/dL — ABNORMAL HIGH (ref 70–99)
Potassium: 3.6 mmol/L (ref 3.5–5.1)
Sodium: 140 mmol/L (ref 135–145)
Total Bilirubin: 0.9 mg/dL (ref 0.3–1.2)
Total Protein: 7.3 g/dL (ref 6.5–8.1)

## 2020-09-15 LAB — RAPID URINE DRUG SCREEN, HOSP PERFORMED
Amphetamines: NOT DETECTED
Barbiturates: NOT DETECTED
Benzodiazepines: NOT DETECTED
Cocaine: NOT DETECTED
Opiates: NOT DETECTED
Tetrahydrocannabinol: NOT DETECTED

## 2020-09-15 LAB — URINALYSIS, ROUTINE W REFLEX MICROSCOPIC
Bilirubin Urine: NEGATIVE
Glucose, UA: NEGATIVE mg/dL
Hgb urine dipstick: NEGATIVE
Ketones, ur: 5 mg/dL — AB
Nitrite: NEGATIVE
Protein, ur: NEGATIVE mg/dL
Specific Gravity, Urine: 1.027 (ref 1.005–1.030)
pH: 5 (ref 5.0–8.0)

## 2020-09-15 LAB — CBC
HCT: 41.9 % (ref 36.0–46.0)
Hemoglobin: 14.5 g/dL (ref 12.0–15.0)
MCH: 32.4 pg (ref 26.0–34.0)
MCHC: 34.6 g/dL (ref 30.0–36.0)
MCV: 93.7 fL (ref 80.0–100.0)
Platelets: 281 10*3/uL (ref 150–400)
RBC: 4.47 MIL/uL (ref 3.87–5.11)
RDW: 12.8 % (ref 11.5–15.5)
WBC: 6 10*3/uL (ref 4.0–10.5)
nRBC: 0 % (ref 0.0–0.2)

## 2020-09-15 LAB — APTT: aPTT: 26 seconds (ref 24–36)

## 2020-09-15 LAB — PROTIME-INR
INR: 1 (ref 0.8–1.2)
Prothrombin Time: 13.1 seconds (ref 11.4–15.2)

## 2020-09-15 LAB — CBG MONITORING, ED: Glucose-Capillary: 119 mg/dL — ABNORMAL HIGH (ref 70–99)

## 2020-09-15 LAB — I-STAT BETA HCG BLOOD, ED (MC, WL, AP ONLY): I-stat hCG, quantitative: <5 m[IU]/mL (ref <5)

## 2020-09-15 LAB — ETHANOL: Alcohol, Ethyl (B): <10 mg/dL (ref <10)

## 2020-09-15 MED ORDER — PROCHLORPERAZINE EDISYLATE 10 MG/2ML IJ SOLN
10.0000 mg | Freq: Once | INTRAMUSCULAR | Status: AC
  Administered 2020-09-15: 10 mg via INTRAVENOUS
  Filled 2020-09-15: qty 2

## 2020-09-15 MED ORDER — ACETAMINOPHEN 325 MG PO TABS: 650.0000 mg | ORAL_TABLET | ORAL | Status: DC | PRN

## 2020-09-15 MED ORDER — ENOXAPARIN SODIUM 40 MG/0.4ML ~~LOC~~ SOLN
40.0000 mg | Freq: Every day | SUBCUTANEOUS | Status: DC
  Administered 2020-09-16 – 2020-09-17 (×3): 40 mg via SUBCUTANEOUS
  Filled 2020-09-15 (×3): qty 0.4

## 2020-09-15 MED ORDER — ACETAMINOPHEN 650 MG RE SUPP: 650.0000 mg | RECTAL | Status: DC | PRN

## 2020-09-15 MED ORDER — VALACYCLOVIR HCL 500 MG PO TABS
500.0000 mg | ORAL_TABLET | Freq: Every day | ORAL | Status: DC
  Administered 2020-09-16 – 2020-09-18 (×3): 500 mg via ORAL
  Filled 2020-09-15 (×4): qty 1

## 2020-09-15 MED ORDER — SODIUM CHLORIDE 0.9 % IV SOLN: INTRAVENOUS | Status: AC

## 2020-09-15 MED ORDER — ASPIRIN 325 MG PO TABS: 325.0000 mg | ORAL_TABLET | Freq: Every day | ORAL | Status: DC

## 2020-09-15 MED ORDER — SODIUM CHLORIDE 0.9 % IV BOLUS
1000.0000 mL | Freq: Once | INTRAVENOUS | Status: AC
  Administered 2020-09-15: 1000 mL via INTRAVENOUS

## 2020-09-15 MED ORDER — SODIUM CHLORIDE 0.9% FLUSH: 3.0000 mL | Freq: Once | INTRAVENOUS | Status: DC

## 2020-09-15 MED ORDER — LORAZEPAM 2 MG/ML IJ SOLN
INTRAMUSCULAR | Status: AC
  Filled 2020-09-15: qty 1

## 2020-09-15 MED ORDER — ASPIRIN 300 MG RE SUPP: 300.0000 mg | Freq: Every day | RECTAL | Status: DC

## 2020-09-15 MED ORDER — KETOROLAC TROMETHAMINE 15 MG/ML IJ SOLN
15.0000 mg | Freq: Once | INTRAMUSCULAR | Status: AC
  Administered 2020-09-15: 15 mg via INTRAVENOUS
  Filled 2020-09-15: qty 1

## 2020-09-15 MED ORDER — DEXAMETHASONE SODIUM PHOSPHATE 4 MG/ML IJ SOLN
4.0000 mg | Freq: Once | INTRAMUSCULAR | Status: AC
  Administered 2020-09-15: 4 mg via INTRAVENOUS
  Filled 2020-09-15: qty 1

## 2020-09-15 MED ORDER — DIPHENHYDRAMINE HCL 50 MG/ML IJ SOLN
50.0000 mg | Freq: Once | INTRAMUSCULAR | Status: AC
  Administered 2020-09-15: 50 mg via INTRAVENOUS
  Filled 2020-09-15: qty 1

## 2020-09-15 MED ORDER — ACETAMINOPHEN 160 MG/5ML PO SOLN: 650.0000 mg | ORAL | Status: DC | PRN

## 2020-09-15 MED ORDER — STROKE: EARLY STAGES OF RECOVERY BOOK
Freq: Once | Status: AC
  Filled 2020-09-15: qty 1

## 2020-09-15 NOTE — ED Provider Notes (Signed)
[facility] EMERGENCY DEPARTMENT Provider Note   CSN: [id number] Arrival date & time: 09/15/20  1606     History Chief Complaint  Patient presents with  . Dizziness  . Altered Mental Status    Sarah Simpson is a 55 y.o. female with PMHx HTN and migraines who presents to the ED today from UC with complaint of dizziness and altered mental status. Pt reports that yesterday while driving to her family's home she began having tingling in her arms and legs. She also noticed bilateral blurry vision and reported she could not read any of the road signs. She states that she drove anyways because she knew the way and when she got to her family's house she noticed that she felt off balanced and like she was drunk while walking and bumping into things. Pt reports later in the night she began having garbled speech. She went to UC this morning and was sent here for further evaluation with concern for possible stroke. Last known normal around 11 AM yesterday. No history of strokes. Pt does mention she recently had her insurance dropped and her blood pressure medication was going to cost several hundred dollars out of pocket; she has not been taking her BP meds for 2-3 weeks now.   The history is provided by the patient and medical records.       Past Medical History:  Diagnosis Date  . ADD (attention deficit disorder)   . Allergy   . Anemia   . Arthritis    OA back   . Cyst of right ovary   . Depression   . Heart murmur   . Hypertension   . Kidney stone   . Neuromuscular disorder (HCC)    RLS  . Restless leg syndrome   . Seizures ([place])    in her 20's- none since-? etiology     There are no problems to display for this patient.   Past Surgical History:  Procedure Laterality Date  . APPENDECTOMY  2008  . BUNIONECTOMY  2009   x 2  . WISDOM TOOTH EXTRACTION       OB History   No obstetric history on file.     Family History  Problem Relation Age of Onset   . Colon cancer Father 97  . Colon cancer Mother 55  . Heart attack Mother   . Dementia Mother   . Colon polyps Neg Hx   . Esophageal cancer Neg Hx   . Stomach cancer Neg Hx   . Rectal cancer Neg Hx     Social History   Tobacco Use  . Smoking status: Former Research scientist (life sciences)  . Smokeless tobacco: Never Used  . Tobacco comment: on and off in the past, quit in 2002  Vaping Use  . Vaping Use: Never used  Substance Use Topics  . Alcohol use: Yes    Comment: occ  . Drug use: Never    Home Medications Prior to Admission medications   Medication Sig Start Date End Date Taking? Authorizing Provider  amoxicillin-clavulanate (AUGMENTIN) 875-125 MG tablet Take 1 tablet by mouth 2 (two) times daily. 02/14/18   Inda Coke, PA  diphenhydrAMINE (BENADRYL) 25 mg capsule Take 25 mg by mouth every 6 (six) hours as needed.    [provider]  Evening Primrose Oil 1000 MG CAPS Take by mouth.    [provider]  gabapentin (NEURONTIN) 300 MG capsule Take 1 capsule by mouth 3 (three) times daily. 05/26/17   [provider]  levofloxacin (LEVAQUIN) 500 MG tablet Take 1 tablet (500 mg total) by mouth daily. 02/16/18   Irven Baltimore, MD  Melatonin 3 MG TABS Take by mouth.    [provider]  Multiple Vitamin (MULTIVITAMIN) tablet Take 1 tablet by mouth daily.    [provider]  ondansetron (ZOFRAN) 4 MG tablet Take 1 tablet (4 mg total) by mouth every 8 (eight) hours as needed for nausea or vomiting. 02/14/18   Inda Coke, PA  OVER THE COUNTER MEDICATION cbd oil    [provider]  traZODone (DESYREL) 50 MG tablet Take 50 mg by mouth daily. 05/02/17   [provider]  valACYclovir (VALTREX) 500 MG tablet Take 500 mg by mouth daily. 04/05/17   [provider]    Allergies    Levaquin [levofloxacin], Ciprofloxacin, Dynacin [minocycline], Sulfamethazine, and Amoxil [amoxicillin]  Review of Systems   Review of Systems  Eyes:  Positive for visual disturbance.  Musculoskeletal: Positive for gait problem.  Neurological: Positive for dizziness and speech difficulty.  All other systems reviewed and are negative.   Physical Exam Updated Vital Signs BP (!) 149/82 (BP Location: Right Arm)   Pulse 76   Temp 98.9 F (37.2 C) (Oral)   Resp 16   SpO2 97%   Physical Exam Vitals and nursing note reviewed.  Constitutional:      Appearance: She is not ill-appearing.  HENT:     Head: Normocephalic and atraumatic.  Eyes:     Conjunctiva/sclera: Conjunctivae normal.  Cardiovascular:     Rate and Rhythm: Normal rate and regular rhythm.  Pulmonary:     Effort: Pulmonary effort is normal.     Breath sounds: Normal breath sounds. No wheezing, rhonchi or rales.  Abdominal:     Palpations: Abdomen is soft.     Tenderness: There is no abdominal tenderness.  Musculoskeletal:     Cervical back: Neck supple.  Skin:    General: Skin is warm and dry.  Neurological:     Mental Status: She is alert.     Comments: Alert and oriented to self, place, time and event.   Speech is slowed.   Strength 4/5 to RUE/RLE. Strength 5/5 to left side.   Sensation intact in upper/lower extremities   Unsteady gait.  No pronator drift.  Dysmetria to right side with finger to nose.   CN I not tested  CN II grossly intact visual fields bilaterally. Did not visualize posterior eye.   CN III, IV, VI PERRLA and EOMs intact bilaterally  CN V Intact sensation to sharp and light touch to the face  Right side facial droop CN VIII not tested  CN IX, X no uvula deviation, symmetric rise of soft palate  CN XI 5/5 SCM and trapezius strength bilaterally  CN XII Midline tongue protrusion, symmetric L/R movements      ED Results / Procedures / Treatments   Labs (all labs ordered are listed, but only abnormal results are displayed) Labs Reviewed  DIFFERENTIAL - Abnormal; Notable for the following components:      Result Value   Eosinophils  Absolute 0.9 (*)    All other components within normal limits  COMPREHENSIVE METABOLIC PANEL - Abnormal; Notable for the following components:   Glucose, Bld 111 (*)    ALT 54 (*)    All other components within normal limits  URINALYSIS, ROUTINE W REFLEX MICROSCOPIC - Abnormal; Notable for the following components:   Color, Urine AMBER (*)  APPearance HAZY (*)    Ketones, ur 5 (*)    Leukocytes,Ua MODERATE (*)    Bacteria, UA RARE (*)    All other components within normal limits  CBG MONITORING, ED - Abnormal; Notable for the following components:   Glucose-Capillary 119 (*)    All other components within normal limits  SARS CORONAVIRUS 2 (TAT 6-24 HRS)  PROTIME-INR  APTT  CBC  ETHANOL  RAPID URINE DRUG SCREEN, HOSP PERFORMED  I-STAT CHEM 8, ED  I-STAT BETA HCG BLOOD, ED ([facility], [facility], [facility] ONLY)    EKG None  Radiology CT HEAD WO CONTRAST  Result Date: 09/15/2020 CLINICAL DATA:  Neuro deficit, acute, stroke suspected; dizziness, nonspecific. Additional history provided: Patient reports tingling, dizziness, bumping into things, feeling lightheaded and blurry vision. EXAM: CT HEAD WITHOUT CONTRAST TECHNIQUE: Contiguous axial images were obtained from the base of the skull through the vertex without intravenous contrast. COMPARISON:  No pertinent prior exams available for comparison. FINDINGS: Brain: Cerebral volume is normal for age. A small age-indeterminate lacunar infarct is questioned within the left pons (series 3, image 10) (series 6, image 31) (series 5, images 36 and 37). There is no acute intracranial hemorrhage. No demarcated cortical infarct. No extra-axial fluid collection. No evidence of intracranial mass. No midline shift. Vascular: No hyperdense vessel. Skull: Normal. Negative for fracture or focal lesion. Sinuses/Orbits: Visualized orbits show no acute finding. Small mucous retention cyst within the right ethmoid air cells. IMPRESSION: A small age-indeterminate lacunar infarct  is questioned within the left pons. Consider a brain MRI for further evaluation. No evidence of acute intracranial hemorrhage or acute cortical infarction. Small right ethmoid sinus mucous retention cyst. Electronically Signed   By: Kellie Simmering DO   On: 09/15/2020 16:55   MR ANGIO HEAD WO CONTRAST  Result Date: 09/15/2020 CLINICAL DATA:  Stroke follow-up EXAM: MRI HEAD WITHOUT CONTRAST MRA HEAD WITHOUT CONTRAST TECHNIQUE: Multiplanar, multiecho pulse sequences of the brain and surrounding structures were obtained without intravenous contrast. Angiographic images of the head were obtained using MRA technique without contrast. The patient did not complete the full length examination. Only 6 series are provided for interpretation. COMPARISON:  None. FINDINGS: MRI HEAD FINDINGS Brain: Small acute infarct of the left caudate tail. Normal white matter signal. Normal volume of CSF spaces. No chronic microhemorrhage. Normal midline structures. Vascular: Major flow voids are preserved. Skull and upper cervical spine: Normal calvarium and skull base. Visualized upper cervical spine and soft tissues are normal. Sinuses/Orbits:No paranasal sinus fluid levels or advanced mucosal thickening. No mastoid or middle ear effusion. Normal orbits. MRA HEAD FINDINGS POSTERIOR CIRCULATION: --Vertebral arteries: Normal --Inferior cerebellar arteries: Normal. --Basilar artery: Normal. --Superior cerebellar arteries: Normal. --Posterior cerebral arteries: Normal. ANTERIOR CIRCULATION: --Intracranial internal carotid arteries: Normal. --Anterior cerebral arteries (ACA): Normal. --Middle cerebral arteries (MCA): Normal. ANATOMIC VARIANTS: None IMPRESSION: 1. Truncated examination. 2. Small acute infarct of the left caudate tail. 3. Normal intracranial MRA. Electronically Signed   By: Ulyses Jarred M.D.   On: 09/15/2020 22:36   MR BRAIN WO CONTRAST  Result Date: 09/15/2020 CLINICAL DATA:  Stroke follow-up EXAM: MRI HEAD WITHOUT  CONTRAST MRA HEAD WITHOUT CONTRAST TECHNIQUE: Multiplanar, multiecho pulse sequences of the brain and surrounding structures were obtained without intravenous contrast. Angiographic images of the head were obtained using MRA technique without contrast. The patient did not complete the full length examination. Only 6 series are provided for interpretation. COMPARISON:  None. FINDINGS: MRI HEAD FINDINGS Brain: Small acute infarct of the left  caudate tail. Normal white matter signal. Normal volume of CSF spaces. No chronic microhemorrhage. Normal midline structures. Vascular: Major flow voids are preserved. Skull and upper cervical spine: Normal calvarium and skull base. Visualized upper cervical spine and soft tissues are normal. Sinuses/Orbits:No paranasal sinus fluid levels or advanced mucosal thickening. No mastoid or middle ear effusion. Normal orbits. MRA HEAD FINDINGS POSTERIOR CIRCULATION: --Vertebral arteries: Normal --Inferior cerebellar arteries: Normal. --Basilar artery: Normal. --Superior cerebellar arteries: Normal. --Posterior cerebral arteries: Normal. ANTERIOR CIRCULATION: --Intracranial internal carotid arteries: Normal. --Anterior cerebral arteries (ACA): Normal. --Middle cerebral arteries (MCA): Normal. ANATOMIC VARIANTS: None IMPRESSION: 1. Truncated examination. 2. Small acute infarct of the left caudate tail. 3. Normal intracranial MRA. Electronically Signed   By: Ulyses Jarred M.D.   On: 09/15/2020 22:36    Procedures Procedures   Medications Ordered in ED Medications  sodium chloride flush (NS) 0.9 % injection 3 mL (3 mLs Intravenous Not Given 09/15/20 1845)  prochlorperazine (COMPAZINE) injection 10 mg (10 mg Intravenous Given 09/15/20 1915)  dexamethasone (DECADRON) injection 4 mg (4 mg Intravenous Given 09/15/20 1915)  diphenhydrAMINE (BENADRYL) injection 50 mg (50 mg Intravenous Given 09/15/20 1914)  ketorolac (TORADOL) 15 MG/ML injection 15 mg (15 mg Intravenous Given 09/15/20 1914)   sodium chloride 0.9 % bolus 1,000 mL (1,000 mLs Intravenous New Bag/Given 09/15/20 1922)    ED Course  I have reviewed the triage vital signs and the nursing notes.  Pertinent labs & imaging results that were available during my care of the patient were reviewed by me and considered in my medical decision making (see chart for details).    MDM Rules/Calculators/A&P                          55 year old female who presents to the ED today from urgent care with complaints of dizziness, steady gait, confusion, garbled speech with last known normal around 1100 yesterday.  On arrival to the ED vitals are stable.  Blood pressure 141/87.  Patient did recently stop taking blood pressure medication as she had a lapse in insurance coverage.  She had a CT scan done while in the waiting room which does show a age-indeterminate lacunar infarct in the left pons with recommendations for MRI.  On exam patient does have decreased strength on her right side with an obvious right-sided facial droop.  She has dysmetria with right sided finger-to-nose.  She also has an unsteady gait.  I have spoken with Dr. Lorrin Goodell who recommends MRI brain as well as MRA head and neck.  He will have one of his nighttime to members, and evaluate patient.  Staff informs that patient updated her while she was getting labs that she has a history of complex migraines and currently has a 3 out of 10 headache.  Question if this could be causing symptoms.  Will treat with migraine cocktail at this time.   Pt was able to tolerate brain MRI however could not tolerate the neck. I went to MRI and offered ativan however pt declined. MRI staff reports that they have enough to diagnose stroke at this time.   MRI: IMPRESSION:  1. Truncated examination.  2. Small acute infarct of the left caudate tail.  3. Normal intracranial MRA.   Pt will require admission for stroke workup at this time. Dr. Leonel Ramsay with neurology to see patient.    Discussed case with [facility] Dr. Hal Hope who agrees to evaluate patient for admission.   This note  was prepared using Systems analyst and may include unintentional dictation errors due to the inherent limitations of voice recognition software.  Final Clinical Impression(s) / ED Diagnoses Final diagnoses:  Cerebrovascular accident (CVA), unspecified mechanism [facility])    Rx / [place] Orders ED Discharge Orders    None       Eustaquio Maize, PA-C 09/15/20 2341    Noemi Chapel, MD 09/19/20 [phone]

## 2020-09-15 NOTE — ED Notes (Signed)
Pt transported to MRI 

## 2020-09-15 NOTE — Discharge Instructions (Signed)
[facility] [address]  [place], [place] [address] [phone]

## 2020-09-15 NOTE — ED Provider Notes (Incomplete)
Medical screening examination/treatment/procedure(s) were conducted as a shared visit with non-physician practitioner(s) and myself.  I personally evaluated the patient during the encounter.  Clinical Impression:   Final diagnoses:  None      This patient is a 55 year old female, she presents with a complaint of having numbness and tingling to bilateral arms and legs, also has some difficulty speaking which was quite significant.

## 2020-09-15 NOTE — ED Triage Notes (Signed)
Pt arrives from UC for further eval of several neurological complaints-pt woke up yesterday with dizziness, which progressed throughout the day. Around 1630 yesterday pt began having difficulty finding words. Reports she drove to [place] and was confused about where she was going and reports her vision was too blurry to see the road signs but did make it to her family safely. Reports her speech was better this morning but now has gotten worse again. Pt has stopped taking multiple rx medications 2 weeks ago because she was dropped by her insurance.

## 2020-09-15 NOTE — ED Notes (Signed)
MRI called and states that the pt requesting to be done with scans. MRI of neck had not been completed. Writer and PA down to MRI to speak with pt. Pt states she just couldn't do anymore. Pt being brought back to room.

## 2020-09-15 NOTE — ED Notes (Signed)
Patient is being discharged from the Urgent Care and sent to the Emergency Department via pov/friend x 2 . Per [place], [place], patient is in need of higher level of care due to complaints and concerns for neurological diagnosis. Patient is aware and verbalizes understanding of plan of care.  Vitals:   09/15/20 1537  BP: (!) 143/87  Pulse: 77  Resp: (!) 21  Temp: 98.6 F (37 C)  SpO2: 96%

## 2020-09-15 NOTE — ED Triage Notes (Signed)
Patient is tingling, dizzy, bumping into things, feels lightheaded, vision is blurry-all these symptoms started yesterday. Complains of feeling weak.  Patient has right side of mouth drooping.  Bilateral hand strength is equal.  Patient still reports she feels weak.    Patient has 2 adult friends in parking lot that brought patient

## 2020-09-15 NOTE — H&P (Signed)
History and Physical    [name]:[id number] DOB: [birth year] DOA: 09/15/2020  PCP: Michael Boston, MD  Patient coming from: Home.  Chief Complaint: Difficulty speaking.  HPI: Sarah Simpson is a 55 y.o. female with history of hypertension off medications for last few weeks due to financial issues was brought to the ER by patient's friend after patient was found to be talking things which were not making sense.  [place] not [place] to and did not know why she went there.  This evening around 4:30 PM patient's friends noted that patient was talking and not making sense and also had some facial droop.  Patient also felt dizzy and was brought to the ER.  Did not lose function of the upper or lower extremities.  Having chronic paresthesias of upper and lower extremities.  ED Course: In the ER on exam patient appears nonfocal.  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  MR angiogram of the head did not show any obstruction.  EKG shows normal sinus rhythm.  Labs are largely unremarkable.  Covid test is pending.  Neurologist on-call was consulted patient admitted for further stroke work-up.  Review of Systems: As per HPI, rest all negative.   Past Medical History:  Diagnosis Date  . ADD (attention deficit disorder)   . Allergy   . Anemia   . Arthritis    OA back   . Cyst of right ovary   . Depression   . Heart murmur   . Hypertension   . Kidney stone   . Neuromuscular disorder (HCC)    RLS  . Restless leg syndrome   . Seizures ([place])    in her 57's- none since-? etiology     Past Surgical History:  Procedure Laterality Date  . APPENDECTOMY  2008  . BUNIONECTOMY  2009   x 2  . WISDOM TOOTH EXTRACTION       reports that she has quit smoking. She has never used smokeless tobacco. She reports current alcohol use. She reports that she does not use drugs.  Allergies  Allergen Reactions  . Levaquin  [Levofloxacin] Anaphylaxis    Hives and throat swelling   . Ciprofloxacin   . Dynacin [Minocycline]   . Sulfamethazine   . Amoxil [Amoxicillin] Rash    Extreme itching limbs and trunk     Family History  Problem Relation Age of Onset  . Colon cancer Father 69  . Colon cancer Mother 97  . Heart attack Mother   . Dementia Mother   . Colon polyps Neg Hx   . Esophageal cancer Neg Hx   . Stomach cancer Neg Hx   . Rectal cancer Neg Hx     Prior to Admission medications   Medication Sig Start Date End Date Taking? Authorizing Provider  amoxicillin-clavulanate (AUGMENTIN) 875-125 MG tablet Take 1 tablet by mouth 2 (two) times daily. 02/14/18   Inda Coke, PA  diphenhydrAMINE (BENADRYL) 25 mg capsule Take 25 mg by mouth every 6 (six) hours as needed.    [provider]  Evening Primrose Oil 1000 MG CAPS Take by mouth.    [provider]  gabapentin (NEURONTIN) 300 MG capsule Take 1 capsule by mouth 3 (three) times daily. 05/26/17   [provider]  levofloxacin (LEVAQUIN) 500 MG tablet Take 1 tablet (500 mg total) by mouth daily. 02/16/18   Irven Baltimore, MD  Melatonin 3 MG TABS  Take by mouth.    [provider]  Multiple Vitamin (MULTIVITAMIN) tablet Take 1 tablet by mouth daily.    [provider]  ondansetron (ZOFRAN) 4 MG tablet Take 1 tablet (4 mg total) by mouth every 8 (eight) hours as needed for nausea or vomiting. 02/14/18   Inda Coke, PA  OVER THE COUNTER MEDICATION cbd oil    [provider]  traZODone (DESYREL) 50 MG tablet Take 50 mg by mouth daily. 05/02/17   [provider]  valACYclovir (VALTREX) 500 MG tablet Take 500 mg by mouth daily. 04/05/17   [provider]    Physical Exam: Constitutional: Moderately built and nourished. Vitals:   09/15/20 1950 09/15/20 2000 09/15/20 2100 09/15/20 2315  BP: 126/81 128/71 128/77 125/68  Pulse: 75 70 79 75  Resp: 13 13 (!) 21 14  Temp:       TempSrc:      SpO2: 93% 95% 94% 93%   Eyes: Anicteric no pallor. ENMT: No discharge from the ears eyes nose or mouth. Neck: No mass felt.  No neck rigidity. Respiratory: No rhonchi or crepitations. Cardiovascular: S1-S2 heard. Abdomen: Soft nontender bowel sounds present. Musculoskeletal: No edema. Skin: No rash. Neurologic: Alert awake oriented to time place and person.  Moves all extremities 5 x 5.  No facial asymmetry tongue is midline pupils are equal and reacting to light. Psychiatric: Appears normal.  Normal affect.   Labs on Admission: I have personally reviewed following labs and imaging studies  CBC: Recent Labs  Lab 09/15/20 1628  WBC 6.0  NEUTROABS 2.3  HGB 14.5  HCT 41.9  MCV 93.7  PLT 443   Basic Metabolic Panel: Recent Labs  Lab 09/15/20 1628  NA 140  K 3.6  CL 105  CO2 26  GLUCOSE 111*  BUN 12  CREATININE 0.57  CALCIUM 9.4   GFR: CrCl cannot be calculated (Unknown ideal weight.). Liver Function Tests: Recent Labs  Lab 09/15/20 1628  AST 38  ALT 54*  ALKPHOS 43  BILITOT 0.9  PROT 7.3  ALBUMIN 4.2   No results for input(s): LIPASE, AMYLASE in the last 168 hours. No results for input(s): AMMONIA in the last 168 hours. Coagulation Profile: Recent Labs  Lab 09/15/20 1628  INR 1.0   Cardiac Enzymes: No results for input(s): CKTOTAL, CKMB, CKMBINDEX, TROPONINI in the last 168 hours. BNP (last 3 results) No results for input(s): PROBNP in the last 8760 hours. HbA1C: No results for input(s): HGBA1C in the last 72 hours. CBG: Recent Labs  Lab 09/15/20 1618  GLUCAP 119*   Lipid Profile: No results for input(s): CHOL, HDL, LDLCALC, TRIG, CHOLHDL, LDLDIRECT in the last 72 hours. Thyroid Function Tests: No results for input(s): TSH, T4TOTAL, FREET4, T3FREE, THYROIDAB in the last 72 hours. Anemia Panel: No results for input(s): VITAMINB12, FOLATE, FERRITIN, TIBC, IRON, RETICCTPCT in the last 72 hours. Urine analysis:    Component  Value Date/Time   COLORURINE AMBER (A) 09/15/2020 1848   APPEARANCEUR HAZY (A) 09/15/2020 1848   LABSPEC 1.027 09/15/2020 1848   PHURINE 5.0 09/15/2020 1848   GLUCOSEU NEGATIVE 09/15/2020 1848   HGBUR NEGATIVE 09/15/2020 1848   BILIRUBINUR NEGATIVE 09/15/2020 1848   KETONESUR 5 (A) 09/15/2020 1848   PROTEINUR NEGATIVE 09/15/2020 1848   NITRITE NEGATIVE 09/15/2020 1848   LEUKOCYTESUR MODERATE (A) 09/15/2020 1848   Sepsis Labs: @LABRCNTIP (procalcitonin:4,lacticidven:4) )No results found for this or any previous visit (from the past 240 hour(s)).   Radiological Exams on Admission: CT  HEAD WO CONTRAST  Result Date: 09/15/2020 CLINICAL DATA:  Neuro deficit, acute, stroke suspected; dizziness, nonspecific. Additional history provided: Patient reports tingling, dizziness, bumping into things, feeling lightheaded and blurry vision. EXAM: CT HEAD WITHOUT CONTRAST TECHNIQUE: Contiguous axial images were obtained from the base of the skull through the vertex without intravenous contrast. COMPARISON:  No pertinent prior exams available for comparison. FINDINGS: Brain: Cerebral volume is normal for age. A small age-indeterminate lacunar infarct is questioned within the left pons (series 3, image 10) (series 6, image 31) (series 5, images 36 and 37). There is no acute intracranial hemorrhage. No demarcated cortical infarct. No extra-axial fluid collection. No evidence of intracranial mass. No midline shift. Vascular: No hyperdense vessel. Skull: Normal. Negative for fracture or focal lesion. Sinuses/Orbits: Visualized orbits show no acute finding. Small mucous retention cyst within the right ethmoid air cells. IMPRESSION: A small age-indeterminate lacunar infarct is questioned within the left pons. Consider a brain MRI for further evaluation. No evidence of acute intracranial hemorrhage or acute cortical infarction. Small right ethmoid sinus mucous retention cyst. Electronically Signed   By: Kellie Simmering DO    On: 09/15/2020 16:55   MR ANGIO HEAD WO CONTRAST  Result Date: 09/15/2020 CLINICAL DATA:  Stroke follow-up EXAM: MRI HEAD WITHOUT CONTRAST MRA HEAD WITHOUT CONTRAST TECHNIQUE: Multiplanar, multiecho pulse sequences of the brain and surrounding structures were obtained without intravenous contrast. Angiographic images of the head were obtained using MRA technique without contrast. The patient did not complete the full length examination. Only 6 series are provided for interpretation. COMPARISON:  None. FINDINGS: MRI HEAD FINDINGS Brain: Small acute infarct of the left caudate tail. Normal white matter signal. Normal volume of CSF spaces. No chronic microhemorrhage. Normal midline structures. Vascular: Major flow voids are preserved. Skull and upper cervical spine: Normal calvarium and skull base. Visualized upper cervical spine and soft tissues are normal. Sinuses/Orbits:No paranasal sinus fluid levels or advanced mucosal thickening. No mastoid or middle ear effusion. Normal orbits. MRA HEAD FINDINGS POSTERIOR CIRCULATION: --Vertebral arteries: Normal --Inferior cerebellar arteries: Normal. --Basilar artery: Normal. --Superior cerebellar arteries: Normal. --Posterior cerebral arteries: Normal. ANTERIOR CIRCULATION: --Intracranial internal carotid arteries: Normal. --Anterior cerebral arteries (ACA): Normal. --Middle cerebral arteries (MCA): Normal. ANATOMIC VARIANTS: None IMPRESSION: 1. Truncated examination. 2. Small acute infarct of the left caudate tail. 3. Normal intracranial MRA. Electronically Signed   By: Ulyses Jarred M.D.   On: 09/15/2020 22:36   MR BRAIN WO CONTRAST  Result Date: 09/15/2020 CLINICAL DATA:  Stroke follow-up EXAM: MRI HEAD WITHOUT CONTRAST MRA HEAD WITHOUT CONTRAST TECHNIQUE: Multiplanar, multiecho pulse sequences of the brain and surrounding structures were obtained without intravenous contrast. Angiographic images of the head were obtained using MRA technique without contrast. The  patient did not complete the full length examination. Only 6 series are provided for interpretation. COMPARISON:  None. FINDINGS: MRI HEAD FINDINGS Brain: Small acute infarct of the left caudate tail. Normal white matter signal. Normal volume of CSF spaces. No chronic microhemorrhage. Normal midline structures. Vascular: Major flow voids are preserved. Skull and upper cervical spine: Normal calvarium and skull base. Visualized upper cervical spine and soft tissues are normal. Sinuses/Orbits:No paranasal sinus fluid levels or advanced mucosal thickening. No mastoid or middle ear effusion. Normal orbits. MRA HEAD FINDINGS POSTERIOR CIRCULATION: --Vertebral arteries: Normal --Inferior cerebellar arteries: Normal. --Basilar artery: Normal. --Superior cerebellar arteries: Normal. --Posterior cerebral arteries: Normal. ANTERIOR CIRCULATION: --Intracranial internal carotid arteries: Normal. --Anterior cerebral arteries (ACA): Normal. --Middle cerebral arteries (MCA): Normal. ANATOMIC VARIANTS: None  IMPRESSION: 1. Truncated examination. 2. Small acute infarct of the left caudate tail. 3. Normal intracranial MRA. Electronically Signed   By: Ulyses Jarred M.D.   On: 09/15/2020 22:36    EKG: Independently reviewed.  Normal sinus rhythm.  Assessment/Plan Principal Problem:   Acute CVA (cerebrovascular accident) [facility]) Active Problems:   Essential hypertension    1. Acute CVA -appreciate neurology consult and recommendation.  Patient was unable to tolerate MRA of the neck.  Dopplers of the carotids has been ordered.  Patient did pass swallow.  Check 2D echo hemoglobin A1c lipid panel neurochecks physical therapy consult.  Patient is on aspirin Plavix. 2. History of hypertension allow for permissive hypertension follow blood pressure trends.  Recently discontinued antihypertensives due to financial issues. 3. Paresthesias of the extremities neurology recommended getting MRI C-spine and patient can tolerate.   DVT  prophylaxis: Lovenox. Code Status: Full code. Family Communication: Discussed with patient. Disposition Plan: Home. Consults called: Neurology. Admission status: Observation.   Rise Patience MD [facility] Pager [phone].  If 7PM-7AM, please contact night-coverage [email or web address] Password [facility]  09/15/2020, 11:57 PM

## 2020-09-16 ENCOUNTER — Observation Stay (HOSPITAL_COMMUNITY): Payer: Self-pay

## 2020-09-16 ENCOUNTER — Encounter (HOSPITAL_COMMUNITY): Payer: Self-pay | Admitting: Internal Medicine

## 2020-09-16 DIAGNOSIS — I6389 Other cerebral infarction: Secondary | ICD-10-CM

## 2020-09-16 DIAGNOSIS — I639 Cerebral infarction, unspecified: Secondary | ICD-10-CM

## 2020-09-16 LAB — HIV ANTIBODY (ROUTINE TESTING W REFLEX): HIV Screen 4th Generation wRfx: NONREACTIVE

## 2020-09-16 LAB — LIPID PANEL
Cholesterol: 191 mg/dL (ref 0–200)
HDL: 43 mg/dL (ref >40)
LDL Cholesterol: 133 mg/dL — ABNORMAL HIGH (ref 0–99)
Total CHOL/HDL Ratio: 4.4 RATIO
Triglycerides: 75 mg/dL (ref <150)
VLDL: 15 mg/dL (ref 0–40)

## 2020-09-16 LAB — ECHOCARDIOGRAM COMPLETE
Area-P 1/2: 3.91 cm2
Calc EF: 62.5 %
S' Lateral: 3 cm
Single Plane A2C EF: 62 %
Single Plane A4C EF: 62.7 %

## 2020-09-16 LAB — CREATININE, SERUM
Creatinine, Ser: 0.62 mg/dL (ref 0.44–1.00)
GFR, Estimated: >60 mL/min (ref >60)

## 2020-09-16 LAB — CBC
HCT: 38.7 % (ref 36.0–46.0)
Hemoglobin: 13.2 g/dL (ref 12.0–15.0)
MCH: 31.9 pg (ref 26.0–34.0)
MCHC: 34.1 g/dL (ref 30.0–36.0)
MCV: 93.5 fL (ref 80.0–100.0)
Platelets: 241 10*3/uL (ref 150–400)
RBC: 4.14 MIL/uL (ref 3.87–5.11)
RDW: 12.6 % (ref 11.5–15.5)
WBC: 4.9 10*3/uL (ref 4.0–10.5)
nRBC: 0 % (ref 0.0–0.2)

## 2020-09-16 LAB — SARS CORONAVIRUS 2 (TAT 6-24 HRS): SARS Coronavirus 2: NEGATIVE

## 2020-09-16 LAB — HEMOGLOBIN A1C
Hgb A1c MFr Bld: 5.7 % — ABNORMAL HIGH (ref 4.8–5.6)
Mean Plasma Glucose: 116.89 mg/dL

## 2020-09-16 LAB — C-REACTIVE PROTEIN: CRP: 0.6 mg/dL (ref <1.0)

## 2020-09-16 LAB — SEDIMENTATION RATE: Sed Rate: 12 mm/hr (ref 0–22)

## 2020-09-16 MED ORDER — ASPIRIN EC 81 MG PO TBEC
81.0000 mg | DELAYED_RELEASE_TABLET | Freq: Every day | ORAL | Status: DC
  Administered 2020-09-16 – 2020-09-18 (×3): 81 mg via ORAL
  Filled 2020-09-16 (×3): qty 1

## 2020-09-16 MED ORDER — CLOPIDOGREL BISULFATE 75 MG PO TABS
75.0000 mg | ORAL_TABLET | Freq: Every day | ORAL | Status: DC
  Administered 2020-09-17 – 2020-09-18 (×2): 75 mg via ORAL
  Filled 2020-09-16 (×2): qty 1

## 2020-09-16 MED ORDER — LORAZEPAM 2 MG/ML IJ SOLN: 1.0000 mg | Freq: Once | INTRAMUSCULAR | Status: DC

## 2020-09-16 MED ORDER — MELATONIN 3 MG PO TABS
3.0000 mg | ORAL_TABLET | Freq: Every evening | ORAL | Status: DC | PRN
  Administered 2020-09-16 – 2020-09-17 (×2): 3 mg via ORAL
  Filled 2020-09-16 (×2): qty 1

## 2020-09-16 MED ORDER — CLOPIDOGREL BISULFATE 300 MG PO TABS
300.0000 mg | ORAL_TABLET | Freq: Once | ORAL | Status: AC
  Administered 2020-09-16: 300 mg via ORAL
  Filled 2020-09-16: qty 1

## 2020-09-16 NOTE — TOC Initial Note (Signed)
Transition of Care [facility]) - Initial/Assessment Note    Patient Details  Name: Sarah Simpson MRN: [id number] Date of Birth: 11-Nov-1965  [facility]) CM/SW Contact:    Pollie Friar, RN Phone Number: 09/16/2020, 3:44 PM  Clinical Narrative:                 Patient is from home alone. She has no PcP and no insurance. CM met with her and friends at the bedside. She is asking to see financial counseling prior to d/c. CSW has messaged them to see pt.  Pt is interested in getting set up with one of the [facility] for PCP. CM will obtain an appt and place it on the AVS. Pt is also aware of using [facility] pharmacy to assist with the cost of her medications. Pt will need medication assistance at d/c.  TOC following.  Expected Discharge Plan: Home/Self Care Barriers to Discharge: Continued Medical Work up,Inadequate or no insurance   Patient Goals and CMS Choice        Expected Discharge Plan and Services Expected Discharge Plan: Home/Self Care   Discharge Planning Services: CM Consult   Living arrangements for the past 2 months: Single Family Home                                      Prior Living Arrangements/Services Living arrangements for the past 2 months: Single Family Home Lives with:: Self Patient language and need for interpreter reviewed:: Yes Do you feel safe going back to the place where you live?: Yes        Care giver support system in place?: No (comment)   Criminal Activity/Legal Involvement Pertinent to Current Situation/Hospitalization: No - Comment as needed  Activities of Daily Living Home Assistive Devices/Equipment: None ADL Screening (condition at time of admission) Patient's cognitive ability adequate to safely complete daily activities?: Yes Is the patient deaf or have difficulty hearing?: No Does the patient have difficulty seeing, even when wearing glasses/contacts?: No Does the patient have difficulty concentrating,  remembering, or making decisions?: No Patient able to express need for assistance with ADLs?: Yes Does the patient have difficulty dressing or bathing?: No Independently performs ADLs?: Yes (appropriate for developmental age) Does the patient have difficulty walking or climbing stairs?: No Weakness of Legs: None Weakness of Arms/Hands: Both (minimal tingling in fingers)  Permission Sought/Granted                  Emotional Assessment Appearance:: Appears stated age Attitude/Demeanor/Rapport: Engaged Affect (typically observed): Accepting Orientation: : Oriented to Self,Oriented to Place,Oriented to  Time,Oriented to Situation   Psych Involvement: No (comment)  Admission diagnosis:  Acute CVA (cerebrovascular accident) [facility]) [I63.9] Cerebrovascular accident (CVA), unspecified mechanism ([place]) [I63.9] Patient Active Problem List   Diagnosis Date Noted  . Acute CVA (cerebrovascular accident) ([place]) 09/15/2020  . Essential hypertension 09/15/2020   PCP:  Michael Boston, MD Pharmacy:   [id number] - [place], [address] [place] [address] Phone: [phone] Fax: [phone]     Social Determinants of Health (SDOH) Interventions    Readmission Risk Interventions No flowsheet data found.

## 2020-09-16 NOTE — Plan of Care (Signed)
  Problem: Education: Goal: Knowledge of General Education information will improve Description Including pain rating scale, medication(s)/side effects and non-pharmacologic comfort measures Outcome: Progressing   Problem: Health Behavior/Discharge Planning: Goal: Ability to manage health-related needs will improve Outcome: Progressing   Problem: Clinical Measurements: Goal: Ability to maintain clinical measurements within normal limits will improve Outcome: Progressing Goal: Will remain free from infection Outcome: Progressing Goal: Diagnostic test results will improve Outcome: Progressing Goal: Respiratory complications will improve Outcome: Progressing Goal: Cardiovascular complication will be avoided Outcome: Progressing   Problem: Activity: Goal: Risk for activity intolerance will decrease Outcome: Progressing   Problem: Nutrition: Goal: Adequate nutrition will be maintained Outcome: Progressing   Problem: Coping: Goal: Level of anxiety will decrease Outcome: Progressing   Problem: Elimination: Goal: Will not experience complications related to bowel motility Outcome: Progressing Goal: Will not experience complications related to urinary retention Outcome: Progressing   Problem: Pain Managment: Goal: General experience of comfort will improve Outcome: Progressing   Problem: Safety: Goal: Ability to remain free from injury will improve Outcome: Progressing   Problem: Skin Integrity: Goal: Risk for impaired skin integrity will decrease Outcome: Progressing   Problem: Education: Goal: Knowledge of disease or condition will improve Outcome: Progressing Goal: Knowledge of secondary prevention will improve Outcome: Progressing Goal: Knowledge of patient specific risk factors addressed and post discharge goals established will improve Outcome: Progressing Goal: Individualized Educational Video(s) Outcome: Progressing   Problem: Coping: Goal: Will verbalize  positive feelings about self Outcome: Progressing Goal: Will identify appropriate support needs Outcome: Progressing   Problem: Health Behavior/Discharge Planning: Goal: Ability to manage health-related needs will improve Outcome: Progressing   

## 2020-09-16 NOTE — Consult Note (Signed)
Neurology Consultation Reason for Consult: Stroke Referring Physician: Sabra Heck, B  CC: Stroke  History is obtained from: Patient   HPI: Sarah Simpson is a 55 y.o. female with a history of hypertension who presents with facial droop that started yesterday.  She also noticed that she was having some trouble with her speaking, saying words that she did not mean to say.  She states that she was "saying gibberish" but that this is improved considerably since yesterday.  Of note, she also has a problem with chronic paresthesias of her hands and feet.  This is been going on for multiple years, but it does wax and wane.  She describes that it got worse starting couple days ago.  This is a longstanding issue, however not new.  LKW: Yesterday tpa given?: no, outside of window NIHSS: 2   ROS: A 14 point ROS was performed and is negative except as noted in the HPI.   Past Medical History:  Diagnosis Date  . ADD (attention deficit disorder)   . Allergy   . Anemia   . Arthritis    OA back   . Cyst of right ovary   . Depression   . Heart murmur   . Hypertension   . Kidney stone   . Neuromuscular disorder (HCC)    RLS  . Restless leg syndrome   . Seizures ([place])    in her 42's- none since-? etiology      Family History  Problem Relation Age of Onset  . Colon cancer Father 36  . Colon cancer Mother 4  . Heart attack Mother   . Dementia Mother   . Colon polyps Neg Hx   . Esophageal cancer Neg Hx   . Stomach cancer Neg Hx   . Rectal cancer Neg Hx      Social History:  reports that she has quit smoking. She has never used smokeless tobacco. She reports current alcohol use. She reports that she does not use drugs.   Exam: Current vital signs: BP 125/68   Pulse 75   Temp 98.9 F (37.2 C) (Oral)   Resp 14   SpO2 93%  Vital signs in last 24 hours: Temp:  [98.6 F (37 C)-98.9 F (37.2 C)] 98.9 F (37.2 C) (03/29 1618) Pulse Rate:  [70-79] 75 (03/29 2315) Resp:  [12-21]  14 (03/29 2315) BP: (125-149)/(68-87) 125/68 (03/29 2315) SpO2:  [93 %-100 %] 93 % (03/29 2315)   Physical Exam  Constitutional: Appears well-developed and well-nourished.  Psych: Affect appropriate to situation Eyes: No scleral injection HENT: No OP obstruction MSK: no joint deformities.  Cardiovascular: Normal rate and regular rhythm.  Respiratory: Effort normal, non-labored breathing GI: Soft.  No distension. There is no tenderness.  Skin: WDI  Neuro: Mental Status: Patient is awake, alert, oriented to person, place, month, year, and situation. Patient is able to give a clear and coherent history. No signs of aphasia or neglect Cranial Nerves: II: Visual Fields are full. Pupils are equal, round, and reactive to light.   III,IV, VI: EOMI without ptosis or diploplia.  V: Facial sensation is symmetric to temperature VII: Facial movement is symmetric.  VIII: hearing is intact to voice X: Uvula elevates symmetrically XI: Shoulder shrug is symmetric. XII: tongue is midline without atrophy or fasciculations.  Motor: Tone is normal. Bulk is normal. 5/5 strength was present in all four extremities.  Sensory: Sensation is symmetric to light touch and temperature in the arms and legs. Deep Tendon Reflexes: 2+  and symmetric in the biceps and patellae.  Plantars: Toes are downgoing bilaterally.  Cerebellar: She has slow and deliberate movements on finger-nose-finger bilaterally, but does not pass point      I have reviewed labs in epic and the results pertinent to this consultation are: CMP-unremarkable  I have reviewed the images obtained: MRI brain-diffusion abnormality in the caudate tail on the left most consistent with stroke  Impression: 55 year old female with a history of hypertension with acute subcortical infarct.  She will need to be admitted for secondary risk factor modification and physical therapy.  Her paresthesias could represent central or peripheral  pathology, could consider MRI of her cervical spine when she is better able to tolerate it.  Recommendations: - HgbA1c, fasting lipid panel - Frequent neuro checks - Echocardiogram - Carotid dopplers - Prophylactic therapy-Antiplatelet med: Aspirin -81 mg daily with Plavix 75 mg daily after 300 mg load - Risk factor modification - Telemetry monitoring - PT consult, OT consult, Speech consult - Stroke team to follow    Roland Rack, MD [facility] [phone]  If 7pm- 7am, please page neurology on call as listed in [place].

## 2020-09-16 NOTE — ED Notes (Signed)
Patient transported to echo ?

## 2020-09-16 NOTE — Evaluation (Signed)
Occupational Therapy Evaluation Patient Details Name: Sarah Simpson MRN: [id number] DOB: 1966/06/08 Today's Date: 09/16/2020    History of Present Illness Pt is a 55 y.o. female admitted 09/15/20 for confusion, facial droop, slurred speech. 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 PMH includes: HTN (off meds for the past week due to financial issues), chronic paresthesias of upper and lower extremities (unsure of etiology).   Clinical Impression   Pt is typically independent in ADL and mobility. She works part time in [place] and enjoys her cats and going to concerts. TOday she was indpendent for all aspects of ADL. LB dressing, tub transfer, grooming. 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 Today she is not experiencing trouble with peri care. Vision [facility] today during functional assessment. She was provided with fine motor coordination handout (reviewed in full) as well as theraputty (red medium strength) handout (reviewed and practiced in full). Education complete for the acute setting and do not feel that at this time follow up is warranted as Pt is at baseline (tingling but functional) at mod I. OT to sign off at this time.     Follow Up Recommendations  No OT follow up    Equipment Recommendations  None recommended by OT    Recommendations for Other Services       Precautions / Restrictions Precautions Precautions: Fall Restrictions Weight Bearing Restrictions: No      Mobility Bed Mobility Overal bed mobility: Modified Independent             General bed mobility comments: no safety awareness of lines    Transfers Overall transfer level: Modified independent               General transfer comment: no LOB    Balance Overall balance assessment: No apparent  balance deficits (not formally assessed)                                         ADL either performed or assessed with clinical judgement   ADL Overall ADL's : Modified independent                                       General ADL Comments: able to don/doff socks, grooming tasks, transfers, tub transfer, sit<>stand transfer     Vision Baseline Vision/History: Wears glasses Wears Glasses: Reading only Patient Visual Report: No change from baseline Vision Assessment?: Yes Eye Alignment: Within Functional Limits Ocular Range of Motion: Within Functional Limits Alignment/Gaze Preference: Within Defined Limits Tracking/Visual Pursuits: Able to track stimulus in all quads without difficulty Convergence: Within functional limits Visual Fields: No apparent deficits Additional Comments: Pt able to read signs during mobility and read instructions on handouts     Perception     Praxis      Pertinent Vitals/Pain Pain Assessment: Faces Faces Pain Scale: Hurts a little bit Pain Location: Chronic back pain, tingling in Bil hands and feet, hx of sciatica and plantar facitis Pain Descriptors / Indicators: Constant;Tingling;Guarding Pain Intervention(s): Monitored during session     Hand Dominance Right   Extremity/Trunk Assessment Upper Extremity Assessment Upper Extremity Assessment: RUE  deficits/detail;LUE deficits/detail RUE Deficits / Details: tingling from the elbow down (chronic) able to perform fine motor tasks and gross motor movements RUE Sensation: decreased light touch (tingling) RUE Coordination: WNL LUE Deficits / Details: tingling from the elbow down (chronic) able to perform fine motor tasks and gross motor movements LUE Sensation: decreased light touch (tingling) LUE Coordination: WNL   Lower Extremity Assessment Lower Extremity Assessment: Defer to PT evaluation   Cervical / Trunk Assessment Cervical / Trunk Assessment: Normal    Communication Communication Communication: No difficulties   Cognition Arousal/Alertness: Awake/alert Behavior During Therapy: WFL for tasks assessed/performed Overall Cognitive Status: Impaired/Different from baseline Area of Impairment: Problem solving;Safety/judgement                         Safety/Judgement: Decreased awareness of safety (line management and awareness)   Problem Solving: Requires verbal cues General Comments: very mild, decreased line awareness, very small sequencing cues (potentially environmental)   General Comments  2 friends visiting (like family - the ones who brought her in) and witnessed instructions and education/therapy session    Exercises Exercises: Other exercises Other Exercises Other Exercises: theraputty (med hot pink) provided with handout; reviewed in full Other Exercises: fine motor coordination handout provided   Shoulder Instructions      Home Living Family/patient expects to be discharged to:: Private residence Living Arrangements: Alone Available Help at Discharge: Friend(s);Available PRN/intermittently Type of Home: Other(Comment) (townhome) Home Access: Stairs to enter Entrance Stairs-Number of Steps: 3 Entrance Stairs-Rails: None Home Layout: Two level Alternate Level Stairs-Number of Steps: flight Alternate Level Stairs-Rails: Left Bathroom Shower/Tub: Teacher, early years/pre: Standard     Home Equipment: None          Prior Functioning/Environment Level of Independence: Independent        Comments: works part time in [place]. still drives, has 2 cats        OT Problem List: Impaired vision/perception;Decreased coordination;Decreased safety awareness;Impaired UE functional use      OT Treatment/Interventions:      OT Goals(Current goals can be found in the care plan section) Acute Rehab OT Goals Patient Stated Goal: to get home by tomorrow OT Goal Formulation: With patient/family Time  For Goal Achievement: 09/30/20 Potential to Achieve Goals: Good  OT Frequency:     Barriers to D/C:            Co-evaluation              AM-PAC OT "6 Clicks" Daily Activity     Outcome Measure Help from another person eating meals?: None Help from another person taking care of personal grooming?: None Help from another person toileting, which includes using toliet, bedpan, or urinal?: None Help from another person bathing (including washing, rinsing, drying)?: None Help from another person to put on and taking off regular upper body clothing?: None Help from another person to put on and taking off regular lower body clothing?: None 6 Click Score: 24   End of Session Equipment Utilized During Treatment: Gait belt Nurse Communication: Mobility status  Activity Tolerance: Patient tolerated treatment well Patient left: in bed;with call bell/phone within reach;with family/visitor present  OT Visit Diagnosis: Muscle weakness (generalized) (M62.81);Other symptoms and signs involving the nervous system (R29.898)                Time: 1700-1749 OT Time Calculation (min): 27 min Charges:  OT General Charges $OT Visit: 1 Visit OT Evaluation $OT  Eval Low Complexity: 1 Low OT Treatments $Self Care/Home Management : 8-22 mins  Jesse Sans OTR/L Acute Rehabilitation Services Pager: [phone] Office: [place] 09/16/2020, 2:30 PM

## 2020-09-16 NOTE — ED Notes (Signed)
Patient transported to inpatient department prior to return to ED treatment room.

## 2020-09-16 NOTE — Progress Notes (Signed)
STROKE TEAM PROGRESS NOTE   INTERVAL HISTORY  Reports symptoms  started 3/24 and into 3/25 and worsened progressively until she presented yesterday.  Noticed vision was off when driving. She thinks she was having a "usual headache" but not her typical migraine when symptoms onset. Had trouble reading road signs. Reports poor depth perception. Then started running into  furniture and walls on 3/28. Felt like she was intoxicated with "head swimming around".  0n 3/29 she reports developing "gibberish" speech and facial droop. Reports tingling in arms and legs for several days below elbows to fingers and below knees to toes. Sensation in her left leg was like something was there making her think she had a clot in her thigh. Has had rashes off intermittently. Friend made her come to the ED yesterday.   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 She recalls that the neurologist could not figure out what was wrong despite multiple tests.   Reports hx of severe migraines and  "usual" recurrent headaches. Triptan did not help. Prednisone helped.   Had a heart murmur as a child. Otherwise denies any heart history. Denies hx of blood clots or miscarriages.   We discussed stroke diagnosis, work up and plan of care. Questions addressed.  No visitors at bedside  Vitals:   09/16/20 0300 09/16/20 0315 09/16/20 0545 09/16/20 0715  BP: (!) 144/81 (!) 145/74 112/70 127/71  Pulse: 77 81 69 76  Resp: 14 14 17    Temp:      TempSrc:      SpO2: 96% 95% 96% 94%   CBC:  Recent Labs  Lab 09/15/20 1628 09/16/20 0211  WBC 6.0 4.9  NEUTROABS 2.3  --   HGB 14.5 13.2  HCT 41.9 38.7  MCV 93.7 93.5  PLT 281 749   Basic Metabolic Panel:  Recent Labs  Lab 09/15/20 1628 09/16/20 0211  NA 140  --   K 3.6  --   CL 105  --   CO2 26  --   GLUCOSE 111*  --   BUN 12  --   CREATININE 0.57 0.62  CALCIUM 9.4  --    Lipid Panel:  Recent Labs  Lab 09/16/20 0211  CHOL 191   TRIG 75  HDL 43  CHOLHDL 4.4  VLDL 15  LDLCALC 133*   HgbA1c:  Recent Labs  Lab 09/16/20 0211  HGBA1C 5.7*   Urine Drug Screen:  Recent Labs  Lab 09/15/20 1848  LABOPIA NONE DETECTED  COCAINSCRNUR NONE DETECTED  LABBENZ NONE DETECTED  AMPHETMU NONE DETECTED  THCU NONE DETECTED  LABBARB NONE DETECTED    Alcohol Level  Recent Labs  Lab 09/15/20 1848  ETH <10    IMAGING past 24 hours CT HEAD WO CONTRAST  Result Date: 09/15/2020 CLINICAL DATA:  Neuro deficit, acute, stroke suspected; dizziness, nonspecific. Additional history provided: Patient reports tingling, dizziness, bumping into things, feeling lightheaded and blurry vision. EXAM: CT HEAD WITHOUT CONTRAST TECHNIQUE: Contiguous axial images were obtained from the base of the skull through the vertex without intravenous contrast. COMPARISON:  No pertinent prior exams available for comparison. FINDINGS: Brain: Cerebral volume is normal for age. A small age-indeterminate lacunar infarct is questioned within the left pons (series 3, image 10) (series 6, image 31) (series 5, images 36 and 37). There is no acute intracranial hemorrhage. No demarcated cortical infarct. No extra-axial fluid collection. No evidence of intracranial mass. No midline shift. Vascular: No hyperdense vessel. Skull: Normal.  Negative for fracture or focal lesion. Sinuses/Orbits: Visualized orbits show no acute finding. Small mucous retention cyst within the right ethmoid air cells. IMPRESSION: A small age-indeterminate lacunar infarct is questioned within the left pons. Consider a brain MRI for further evaluation. No evidence of acute intracranial hemorrhage or acute cortical infarction. Small right ethmoid sinus mucous retention cyst. Electronically Signed   By: Kellie Simmering DO   On: 09/15/2020 16:55   MR ANGIO HEAD WO CONTRAST  Result Date: 09/15/2020 CLINICAL DATA:  Stroke follow-up EXAM: MRI HEAD WITHOUT CONTRAST MRA HEAD WITHOUT CONTRAST TECHNIQUE:  Multiplanar, multiecho pulse sequences of the brain and surrounding structures were obtained without intravenous contrast. Angiographic images of the head were obtained using MRA technique without contrast. The patient did not complete the full length examination. Only 6 series are provided for interpretation. COMPARISON:  None. FINDINGS: MRI HEAD FINDINGS Brain: Small acute infarct of the left caudate tail. Normal white matter signal. Normal volume of CSF spaces. No chronic microhemorrhage. Normal midline structures. Vascular: Major flow voids are preserved. Skull and upper cervical spine: Normal calvarium and skull base. Visualized upper cervical spine and soft tissues are normal. Sinuses/Orbits:No paranasal sinus fluid levels or advanced mucosal thickening. No mastoid or middle ear effusion. Normal orbits. MRA HEAD FINDINGS POSTERIOR CIRCULATION: --Vertebral arteries: Normal --Inferior cerebellar arteries: Normal. --Basilar artery: Normal. --Superior cerebellar arteries: Normal. --Posterior cerebral arteries: Normal. ANTERIOR CIRCULATION: --Intracranial internal carotid arteries: Normal. --Anterior cerebral arteries (ACA): Normal. --Middle cerebral arteries (MCA): Normal. ANATOMIC VARIANTS: None IMPRESSION: 1. Truncated examination. 2. Small acute infarct of the left caudate tail. 3. Normal intracranial MRA. Electronically Signed   By: Ulyses Jarred M.D.   On: 09/15/2020 22:36   MR BRAIN WO CONTRAST  Result Date: 09/15/2020 CLINICAL DATA:  Stroke follow-up EXAM: MRI HEAD WITHOUT CONTRAST MRA HEAD WITHOUT CONTRAST TECHNIQUE: Multiplanar, multiecho pulse sequences of the brain and surrounding structures were obtained without intravenous contrast. Angiographic images of the head were obtained using MRA technique without contrast. The patient did not complete the full length examination. Only 6 series are provided for interpretation. COMPARISON:  None. FINDINGS: MRI HEAD FINDINGS Brain: Small acute infarct of the  left caudate tail. Normal white matter signal. Normal volume of CSF spaces. No chronic microhemorrhage. Normal midline structures. Vascular: Major flow voids are preserved. Skull and upper cervical spine: Normal calvarium and skull base. Visualized upper cervical spine and soft tissues are normal. Sinuses/Orbits:No paranasal sinus fluid levels or advanced mucosal thickening. No mastoid or middle ear effusion. Normal orbits. MRA HEAD FINDINGS POSTERIOR CIRCULATION: --Vertebral arteries: Normal --Inferior cerebellar arteries: Normal. --Basilar artery: Normal. --Superior cerebellar arteries: Normal. --Posterior cerebral arteries: Normal. ANTERIOR CIRCULATION: --Intracranial internal carotid arteries: Normal. --Anterior cerebral arteries (ACA): Normal. --Middle cerebral arteries (MCA): Normal. ANATOMIC VARIANTS: None IMPRESSION: 1. Truncated examination. 2. Small acute infarct of the left caudate tail. 3. Normal intracranial MRA. Electronically Signed   By: Ulyses Jarred M.D.   On: 09/15/2020 22:36   PHYSICAL EXAM Constitutional: Appears well-developed and well-nourished.  Psych: Affect appropriate to situation Eyes: No scleral injection HENT: No OP obstruction MSK: no joint deformities.  Cardiovascular: Normal rate and regular rhythm.  Respiratory: Effort normal, non-labored breathing GI: Soft.  No distension. There is no tenderness.  Skin: WDI  Neuro: Mental Status: Patient is awake, alert, oriented to person, place, month, year, and situation. Patient is able to give a clear and coherent history. No signs of aphasia or neglect Cranial Nerves: II: Visual Fields are full. Pupils are equal,  round, and reactive to light.   III,IV, VI: EOMI without ptosis or diploplia.  V: Facial sensation is symmetric to temperature VII: Facial movement is symmetric.  But has trace right lower facial asymmetry when she smiles. VIII: hearing is intact to voice X: Uvula elevates symmetrically XI: Shoulder shrug is  symmetric. XII: tongue is midline without atrophy or fasciculations.  Motor: Tone is normal. Bulk is normal. 5/5 strength was present in all four extremities.  Sensory: Sensation is symmetric to light touch and temperature in the arms and legs. Deep Tendon Reflexes: 2+ and symmetric in the biceps and patellae.  Plantars: Toes are downgoing bilaterally.  Cerebellar: She has slow and deliberate movements on finger-nose-finger bilaterally, but does not pass point  ASSESSMENT/PLAN 55 year old female with a history of hypertension, seizure like episodes in her 71s, heart murmur, migraine headaches, chronic parasthesias bilat hands, depression, RLS, ADD who presented with facial droop that started 3/28.  She also noticed that she was having some trouble with her speaking, saying words that she did not mean to say and "saying gibberish" but that this is improved considerably since yesterday. Paresthesias could represent central or peripheral pathology.  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 There is no acute intracranial hemorrhage.  MRI brain Diffusion abnormality in the caudate tail on the left most consistent with stroke.  MRA head Truncated examination. Small acute infarct of the left caudate tail. Normal intracranial MRA.  Carotid Doppler Nearly normal bilaterally, no high grade stenosis  2D Echo EF 60-65%, No thrombus, no wall motion abnormality or shunt found.   Bilat LE Doppler Pending  LDL 133  HgbA1c 5.7  VTE prophylaxis - Lovenox ppx    Diet   Diet Heart Room service appropriate? Yes; Fluid consistency: Thin   No antiplatelet or anticoagulant medications prior to admission  Recommend DAPT with ASA 81mg  and Plavix 75mg  x 3 weeks then ASA 81mg  monotherapy  Therapy recommendations:  Pending  Disposition:   TBD  Hypertension  Stable . Permissive hypertension (OK if < 220/120) but gradually normalize in 5-7 days . Long-term BP goal normotensive  Hyperlipidemia  Home meds: None  LDL 133, not at goal < 70  High intensity statin: Lipitor 80mg    Continue statin at discharge  Bilateral parasthesias/Headaches/Rashes   Stroke diagnosis does not explain all of her reported symptoms   Vasculitic labs are pending  May need to complete full MRI brain when better able to tolerate it  Consider MRI of her cervical spine when she is better able to tolerate it.  Other Stroke Risk Factors  Former Cigarette smoker.  Current ETOH use, alcohol level <10, advised to drink no more than 1 drink a day  Obesity, There is no height or weight on file to calculate BMI., BMI >/= 30 associated with increased stroke risk, recommend weight loss, diet and exercise as appropriate   Migraines Other Active Problems     Hospital day # 0  I have personally obtained history,examined this patient, reviewed notes, independently viewed imaging studies, participated in medical decision making and plan of care.ROS completed by me personally and pertinent positives fully documented  I have made any additions or clarifications directly to the above note. Agree with note above.  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  Vascular imaging and echocardiogram unremarkable.  Recommend lab work for vasculitis, anticardiolipin antibodies, lupus anticoagulant as well as TEE and loop recorder to look for cardiac source of embolism and PFO and paroxysmal A. fib.  Continue aspirin and Plavix for 3 weeks followed by aspirin alone and aggressive risk factor modification.  Greater than 50% time during this 35-minute visit was spent in counseling and coordination of care about her cryptogenic stroke and discussion about evaluation treatment plan and  answering questions.  Discussed with Dr. Roderic Palau.  Antony Contras, MD Medical Director [place] Pager: [phone] 09/16/2020 4:01 PM   To contact Stroke Continuity provider, please refer to [email or web address] After hours, contact General Neurology

## 2020-09-16 NOTE — CV Procedure (Signed)
BLE venous duplex completed.  Results can be found under chart review under CV PROC. 09/16/2020 4:09 PM Wylder Macomber RVT, RDMS

## 2020-09-16 NOTE — Progress Notes (Signed)
PROGRESS NOTE    Sarah Simpson  XVQ:[id number] DOB: [birth year] DOA: 09/15/2020 PCP: Michael Boston, MD    Brief Narrative:  55 y/o female admitted to the hospital with aphasia and facial droop. She was found to have left basal ganglia infarct. She was admitted for further work up. Neurology following.   Assessment & Plan:   Principal Problem:   Acute CVA (cerebrovascular accident) [facility]) Active Problems:   Essential hypertension   Acute CVA, acute large left basal ganglia infarct -noted on MRI head  -etiology unclear -on dual antiplatelet therapy for 3 weeks then aspirin alone -she has undergone carotid dopplers and echo, which were unremarkable -discussed with Dr. Leonie Man -recommendations for TEE and loop recorder -discussed with cardiology and this can be done on Friday  HLD -continue on high intensity statin  Bilateral paraesthesias/rash -unclear etiology -can consider outpatient MRI C spine -Vasculitis work up sent  HTN -holding home dose of losartan to allow permissive hypertension   DVT prophylaxis: enoxaparin (LOVENOX) injection 40 mg Start: 09/16/20 0015  Code Status: full code Family Communication: discussed with patient Disposition Plan: Status is: Observation  The patient will require care spanning > 2 midnights and should be moved to inpatient because: Ongoing diagnostic testing needed not appropriate for outpatient work up  [place]: The patient is from: Home              Anticipated d/c is to: Home              Patient currently is not medically stable to d/c.   Difficult to place patient No         Consultants:   Neurology  Procedures:     Antimicrobials:       Subjective: No chest pain or shortness of breath  Objective: Vitals:   09/16/20 0715 09/16/20 0748 09/16/20 0931 09/16/20 1731  BP: 127/71  137/75 127/67  Pulse: 76  76 77  Resp:   18 18  Temp:  98.4 F (36.9 C) 98.4 F (36.9 C) 98.2 F (36.8 C)  TempSrc:  Oral  Oral Oral  SpO2: 94%  99% 100%  Weight:   98.9 kg   Height:   5\' 6"  (1.676 m)     Intake/Output Summary (Last 24 hours) at 09/16/2020 1905 Last data filed at 09/16/2020 1840 Gross per 24 hour  Intake 2532.5 ml  Output --  Net 2532.5 ml   Filed Weights   09/16/20 0931  Weight: 98.9 kg    Examination:  General exam: Appears calm and comfortable  Respiratory system: Clear to auscultation. Respiratory effort normal. Cardiovascular system: S1 & S2 heard, RRR. No JVD, murmurs, rubs, gallops or clicks. No pedal edema. Gastrointestinal system: Abdomen is nondistended, soft and nontender. No organomegaly or masses felt. Normal bowel sounds heard. Central nervous system: Alert and oriented. No focal neurological deficits. Extremities: Symmetric 5 x 5 power. Skin: No rashes, lesions or ulcers Psychiatry: Judgement and insight appear normal. Mood & affect appropriate.     Data Reviewed: I have personally reviewed following labs and imaging studies  CBC: Recent Labs  Lab 09/15/20 1628 09/16/20 0211  WBC 6.0 4.9  NEUTROABS 2.3  --   HGB 14.5 13.2  HCT 41.9 38.7  MCV 93.7 93.5  PLT 281 093   Basic Metabolic Panel: Recent Labs  Lab 09/15/20 1628 09/16/20 0211  NA 140  --   K 3.6  --   CL 105  --   CO2 26  --  GLUCOSE 111*  --   BUN 12  --   CREATININE 0.57 0.62  CALCIUM 9.4  --    GFR: Estimated Creatinine Clearance: 93.1 mL/min (by C-G formula based on SCr of 0.62 mg/dL). Liver Function Tests: Recent Labs  Lab 09/15/20 1628  AST 38  ALT 54*  ALKPHOS 43  BILITOT 0.9  PROT 7.3  ALBUMIN 4.2   No results for input(s): LIPASE, AMYLASE in the last 168 hours. No results for input(s): AMMONIA in the last 168 hours. Coagulation Profile: Recent Labs  Lab 09/15/20 1628  INR 1.0   Cardiac Enzymes: No results for input(s): CKTOTAL, CKMB, CKMBINDEX, TROPONINI in the last 168 hours. BNP (last 3 results) No results for input(s): PROBNP in the last 8760  hours. HbA1C: Recent Labs    09/16/20 0211  HGBA1C 5.7*   CBG: Recent Labs  Lab 09/15/20 1618  GLUCAP 119*   Lipid Profile: Recent Labs    09/16/20 0211  CHOL 191  HDL 43  LDLCALC 133*  TRIG 75  CHOLHDL 4.4   Thyroid Function Tests: No results for input(s): TSH, T4TOTAL, FREET4, T3FREE, THYROIDAB in the last 72 hours. Anemia Panel: No results for input(s): VITAMINB12, FOLATE, FERRITIN, TIBC, IRON, RETICCTPCT in the last 72 hours. Sepsis Labs: No results for input(s): PROCALCITON, LATICACIDVEN in the last 168 hours.  Recent Results (from the past 240 hour(s))  SARS CORONAVIRUS 2 (TAT 6-24 HRS) Nasopharyngeal Nasopharyngeal Swab     Status: None   Collection Time: 09/16/20 12:50 AM   Specimen: Nasopharyngeal Swab  Result Value Ref Range Status   SARS Coronavirus 2 NEGATIVE NEGATIVE Final    Comment: (NOTE) SARS-CoV-2 target nucleic acids are NOT DETECTED.  The SARS-CoV-2 RNA is generally detectable in upper and lower respiratory specimens during the acute phase of infection. Negative results do not preclude SARS-CoV-2 infection, do not rule out co-infections with other pathogens, and should not be used as the sole basis for treatment or other patient management decisions. Negative results must be combined with clinical observations, patient history, and epidemiological information. The expected result is Negative.  Fact Sheet for Patients: SugarRoll.be  Fact Sheet for Healthcare Providers: https://www.woods-mathews.com/  This test is not yet approved or cleared by the [place] FDA and  has been authorized for detection and/or diagnosis of SARS-CoV-2 by FDA under an Emergency Use Authorization (EUA). This EUA will remain  in effect (meaning this test can be used) for the duration of the COVID-19 declaration under Se ction 564(b)(1) of the Act, 21 U.S.C. section 360bbb-3(b)(1), unless the authorization is terminated  or revoked sooner.  Performed at [facility], [place] [address] [place], [place] [address]          Radiology Studies: CT HEAD WO CONTRAST  Result Date: 09/15/2020 CLINICAL DATA:  Neuro deficit, acute, stroke suspected; dizziness, nonspecific. Additional history provided: Patient reports tingling, dizziness, bumping into things, feeling lightheaded and blurry vision. EXAM: CT HEAD WITHOUT CONTRAST TECHNIQUE: Contiguous axial images were obtained from the base of the skull through the vertex without intravenous contrast. COMPARISON:  No pertinent prior exams available for comparison. FINDINGS: Brain: Cerebral volume is normal for age. A small age-indeterminate lacunar infarct is questioned within the left pons (series 3, image 10) (series 6, image 31) (series 5, images 36 and 37). There is no acute intracranial hemorrhage. No demarcated cortical infarct. No extra-axial fluid collection. No evidence of intracranial mass. No midline shift. Vascular: No hyperdense vessel. Skull: Normal. Negative for fracture or  focal lesion. Sinuses/Orbits: Visualized orbits show no acute finding. Small mucous retention cyst within the right ethmoid air cells. IMPRESSION: A small age-indeterminate lacunar infarct is questioned within the left pons. Consider a brain MRI for further evaluation. No evidence of acute intracranial hemorrhage or acute cortical infarction. Small right ethmoid sinus mucous retention cyst. Electronically Signed   By: Kellie Simmering DO   On: 09/15/2020 16:55   MR ANGIO HEAD WO CONTRAST  Result Date: 09/15/2020 CLINICAL DATA:  Stroke follow-up EXAM: MRI HEAD WITHOUT CONTRAST MRA HEAD WITHOUT CONTRAST TECHNIQUE: Multiplanar, multiecho pulse sequences of the brain and surrounding structures were obtained without intravenous contrast. Angiographic images of the head were obtained using MRA technique without contrast. The patient did not complete the full length examination. Only 6 series are  provided for interpretation. COMPARISON:  None. FINDINGS: MRI HEAD FINDINGS Brain: Small acute infarct of the left caudate tail. Normal white matter signal. Normal volume of CSF spaces. No chronic microhemorrhage. Normal midline structures. Vascular: Major flow voids are preserved. Skull and upper cervical spine: Normal calvarium and skull base. Visualized upper cervical spine and soft tissues are normal. Sinuses/Orbits:No paranasal sinus fluid levels or advanced mucosal thickening. No mastoid or middle ear effusion. Normal orbits. MRA HEAD FINDINGS POSTERIOR CIRCULATION: --Vertebral arteries: Normal --Inferior cerebellar arteries: Normal. --Basilar artery: Normal. --Superior cerebellar arteries: Normal. --Posterior cerebral arteries: Normal. ANTERIOR CIRCULATION: --Intracranial internal carotid arteries: Normal. --Anterior cerebral arteries (ACA): Normal. --Middle cerebral arteries (MCA): Normal. ANATOMIC VARIANTS: None IMPRESSION: 1. Truncated examination. 2. Small acute infarct of the left caudate tail. 3. Normal intracranial MRA. Electronically Signed   By: Ulyses Jarred M.D.   On: 09/15/2020 22:36   MR BRAIN WO CONTRAST  Result Date: 09/15/2020 CLINICAL DATA:  Stroke follow-up EXAM: MRI HEAD WITHOUT CONTRAST MRA HEAD WITHOUT CONTRAST TECHNIQUE: Multiplanar, multiecho pulse sequences of the brain and surrounding structures were obtained without intravenous contrast. Angiographic images of the head were obtained using MRA technique without contrast. The patient did not complete the full length examination. Only 6 series are provided for interpretation. COMPARISON:  None. FINDINGS: MRI HEAD FINDINGS Brain: Small acute infarct of the left caudate tail. Normal white matter signal. Normal volume of CSF spaces. No chronic microhemorrhage. Normal midline structures. Vascular: Major flow voids are preserved. Skull and upper cervical spine: Normal calvarium and skull base. Visualized upper cervical spine and soft  tissues are normal. Sinuses/Orbits:No paranasal sinus fluid levels or advanced mucosal thickening. No mastoid or middle ear effusion. Normal orbits. MRA HEAD FINDINGS POSTERIOR CIRCULATION: --Vertebral arteries: Normal --Inferior cerebellar arteries: Normal. --Basilar artery: Normal. --Superior cerebellar arteries: Normal. --Posterior cerebral arteries: Normal. ANTERIOR CIRCULATION: --Intracranial internal carotid arteries: Normal. --Anterior cerebral arteries (ACA): Normal. --Middle cerebral arteries (MCA): Normal. ANATOMIC VARIANTS: None IMPRESSION: 1. Truncated examination. 2. Small acute infarct of the left caudate tail. 3. Normal intracranial MRA. Electronically Signed   By: Ulyses Jarred M.D.   On: 09/15/2020 22:36   ECHOCARDIOGRAM COMPLETE  Result Date: 09/16/2020    ECHOCARDIOGRAM REPORT   Patient Name:   Sarah Simpson Date of Exam: 09/16/2020 Medical Rec #:  [id number]         Height:       66.0 in Accession #:    [id number]        Weight:       194.0 lb Date of Birth:  July 12, 1965         BSA:          1.974 m Patient Age:  55 years          BP:           127/71 mmHg Patient Gender: F                 HR:           78 bpm. Exam Location:  Inpatient Procedure: 2D Echo, Cardiac Doppler and Color Doppler Indications:    Stroke I63.9  History:        Patient has no prior history of Echocardiogram examinations.                 Signs/Symptoms:Murmur; Risk Factors:Hypertension.  Sonographer:    Vickie Epley RDCS Referring Phys: [place]  Sonographer Comments: Image acquisition challenging due to respiratory motion. IMPRESSIONS  1. Left ventricular ejection fraction, by estimation, is 60 to 65%. The left ventricle has normal function. The left ventricle has no regional wall motion abnormalities. Left ventricular diastolic parameters were normal.  2. Right ventricular systolic function is normal. The right ventricular size is normal.  3. The mitral valve is normal in structure. Trivial mitral  valve regurgitation. No evidence of mitral stenosis.  4. The aortic valve was not well visualized. Aortic valve regurgitation is trivial. No aortic stenosis is present.  5. The inferior vena cava is normal in size with greater than 50% respiratory variability, suggesting right atrial pressure of 3 mmHg. FINDINGS  Left Ventricle: Left ventricular ejection fraction, by estimation, is 60 to 65%. The left ventricle has normal function. The left ventricle has no regional wall motion abnormalities. The left ventricular internal cavity size was normal in size. There is  no left ventricular hypertrophy. Left ventricular diastolic parameters were normal. Right Ventricle: The right ventricular size is normal. No increase in right ventricular wall thickness. Right ventricular systolic function is normal. Left Atrium: Left atrial size was normal in size. Right Atrium: Right atrial size was normal in size. Pericardium: There is no evidence of pericardial effusion. Mitral Valve: The mitral valve is normal in structure. Trivial mitral valve regurgitation. No evidence of mitral valve stenosis. Tricuspid Valve: The tricuspid valve is normal in structure. Tricuspid valve regurgitation is not demonstrated. No evidence of tricuspid stenosis. Aortic Valve: The aortic valve was not well visualized. Aortic valve regurgitation is trivial. No aortic stenosis is present. Pulmonic Valve: The pulmonic valve was normal in structure. Pulmonic valve regurgitation is not visualized. No evidence of pulmonic stenosis. Aorta: The aortic root is normal in size and structure. Venous: The inferior vena cava is normal in size with greater than 50% respiratory variability, suggesting right atrial pressure of 3 mmHg. IAS/Shunts: No atrial level shunt detected by color flow Doppler.  LEFT VENTRICLE PLAX 2D LVIDd:         4.80 cm      Diastology LVIDs:         3.00 cm      LV e' medial:    8.27 cm/s LV PW:         0.70 cm      LV E/e' medial:  9.3 LV IVS:         0.70 cm      LV e' lateral:   9.68 cm/s LVOT diam:     2.00 cm      LV E/e' lateral: 8.0 LV SV:         93 LV SV Index:   47 LVOT Area:     3.14 cm  LV Volumes (MOD)  LV vol d, MOD A2C: 112.0 ml LV vol d, MOD A4C: 104.0 ml LV vol s, MOD A2C: 42.6 ml LV vol s, MOD A4C: 38.8 ml LV SV MOD A2C:     69.4 ml LV SV MOD A4C:     104.0 ml LV SV MOD BP:      67.9 ml RIGHT VENTRICLE RV S prime:     13.40 cm/s TAPSE (M-mode): 1.9 cm LEFT ATRIUM             Index       RIGHT ATRIUM          Index LA diam:        3.60 cm 1.82 cm/m  RA Area:     9.90 cm LA Vol (A2C):   36.6 ml 18.54 ml/m RA Volume:   19.00 ml 9.62 ml/m LA Vol (A4C):   33.0 ml 16.72 ml/m LA Biplane Vol: 36.9 ml 18.69 ml/m  AORTIC VALVE LVOT Vmax:   135.00 cm/s LVOT Vmean:  94.600 cm/s LVOT VTI:    0.295 m  AORTA Ao Root diam: 3.00 cm MITRAL VALVE MV Area (PHT): 3.91 cm    SHUNTS MV Decel Time: 194 msec    Systemic VTI:  0.30 m MV E velocity: 77.10 cm/s  Systemic Diam: 2.00 cm MV A velocity: 66.80 cm/s MV E/A ratio:  1.15 Jenkins Rouge MD Electronically signed by Jenkins Rouge MD Signature Date/Time: 09/16/2020/9:57:54 AM    Final    VAS US CAROTID  Result Date: 09/16/2020 Carotid Arterial Duplex Study Indications:       CVA, Speech disturbance, Visual disturbance and Dizziness. Risk Factors:      Hypertension, hyperlipidemia, past history of smoking. Other Factors:     Abrupt stop to HTN medication due to insurance reasons. Comparison Study:  No previous exams Performing Technologist: Rogelia Rohrer  Examination Guidelines: A complete evaluation includes B-mode imaging, spectral Doppler, color Doppler, and power Doppler as needed of all accessible portions of each vessel. Bilateral testing is considered an integral part of a complete examination. Limited examinations for reoccurring indications may be performed as noted.  Right Carotid Findings: +----------+--------+--------+--------+------------------+------------------+           PSV cm/sEDV  cm/sStenosisPlaque DescriptionComments           +----------+--------+--------+--------+------------------+------------------+ CCA Prox  94      16                                intimal thickening +----------+--------+--------+--------+------------------+------------------+ CCA Distal84      19                                intimal thickening +----------+--------+--------+--------+------------------+------------------+ ICA Prox  62      17                                                   +----------+--------+--------+--------+------------------+------------------+ ICA Distal63      21                                                   +----------+--------+--------+--------+------------------+------------------+  ECA       107     23                                                   +----------+--------+--------+--------+------------------+------------------+ +----------+--------+-------+----------------+-------------------+           PSV cm/sEDV cmsDescribe        Arm Pressure (mmHG) +----------+--------+-------+----------------+-------------------+ [id number]      0      Multiphasic, WNL                    +----------+--------+-------+----------------+-------------------+ +---------+--------+--+--------+--+---------+ VertebralPSV cm/s40EDV cm/s10Antegrade +---------+--------+--+--------+--+---------+  Left Carotid Findings: +----------+--------+--------+--------+------------------+------------------+           PSV cm/sEDV cm/sStenosisPlaque DescriptionComments           +----------+--------+--------+--------+------------------+------------------+ CCA Prox  85      14                                intimal thickening +----------+--------+--------+--------+------------------+------------------+ CCA Distal107     19                                intimal thickening  +----------+--------+--------+--------+------------------+------------------+ ICA Prox  77      23                                                   +----------+--------+--------+--------+------------------+------------------+ ICA Distal83      23                                                   +----------+--------+--------+--------+------------------+------------------+ ECA       88      13                                                   +----------+--------+--------+--------+------------------+------------------+ +----------+--------+--------+----------------+-------------------+           PSV cm/sEDV cm/sDescribe        Arm Pressure (mmHG) +----------+--------+--------+----------------+-------------------+ Subclavian178     14      Multiphasic, WNL                    +----------+--------+--------+----------------+-------------------+ +---------+--------+--+--------+-+---------+ VertebralPSV cm/s34EDV cm/s8Antegrade +---------+--------+--+--------+-+---------+   Summary: Right Carotid: The extracranial vessels were near-normal with only minimal wall                thickening or plaque. Left Carotid: The extracranial vessels were near-normal with only minimal wall               thickening or plaque. Vertebrals:  Bilateral vertebral arteries demonstrate antegrade flow. Subclavians: Normal flow hemodynamics were seen in bilateral subclavian              arteries. *See table(s) above for measurements and observations.  Electronically signed by Antony Contras MD on 09/16/2020 at  1:36:20 PM.    Final    VAS [place] LOWER EXTREMITY VENOUS (DVT)  Result Date: 09/16/2020  Lower Venous DVT Study Indications: Stroke.  Risk Factors: DVT HX (per patient). Comparison Study: No previous exams Performing Technologist: Rogelia Rohrer  Examination Guidelines: A complete evaluation includes B-mode imaging, spectral Doppler, color Doppler, and power Doppler as needed of all accessible portions of  each vessel. Bilateral testing is considered an integral part of a complete examination. Limited examinations for reoccurring indications may be performed as noted. The reflux portion of the exam is performed with the patient in reverse Trendelenburg.  +---------+---------------+---------+-----------+----------+--------------+ RIGHT    CompressibilityPhasicitySpontaneityPropertiesThrombus Aging +---------+---------------+---------+-----------+----------+--------------+ CFV      Full           Yes      Yes                                 +---------+---------------+---------+-----------+----------+--------------+ SFJ      Full                                                        +---------+---------------+---------+-----------+----------+--------------+ FV Prox  Full           Yes      Yes                                 +---------+---------------+---------+-----------+----------+--------------+ FV Mid   Full           Yes      Yes                                 +---------+---------------+---------+-----------+----------+--------------+ FV DistalFull           Yes      Yes                                 +---------+---------------+---------+-----------+----------+--------------+ PFV      Full                                                        +---------+---------------+---------+-----------+----------+--------------+ POP      Full           Yes      Yes                                 +---------+---------------+---------+-----------+----------+--------------+ PTV      Full                                                        +---------+---------------+---------+-----------+----------+--------------+ PERO     Full                                                        +---------+---------------+---------+-----------+----------+--------------+   +---------+---------------+---------+-----------+----------+--------------+  LEFT      CompressibilityPhasicitySpontaneityPropertiesThrombus Aging +---------+---------------+---------+-----------+----------+--------------+ CFV      Full           Yes      Yes                                 +---------+---------------+---------+-----------+----------+--------------+ SFJ      Full                                                        +---------+---------------+---------+-----------+----------+--------------+ FV Prox  Full           Yes      Yes                                 +---------+---------------+---------+-----------+----------+--------------+ FV Mid   Full           Yes      Yes                                 +---------+---------------+---------+-----------+----------+--------------+ FV DistalFull           Yes      Yes                                 +---------+---------------+---------+-----------+----------+--------------+ PFV      Full                                                        +---------+---------------+---------+-----------+----------+--------------+ POP      Full           Yes      Yes                                 +---------+---------------+---------+-----------+----------+--------------+ PTV      Full                                                        +---------+---------------+---------+-----------+----------+--------------+ PERO     Full                                                        +---------+---------------+---------+-----------+----------+--------------+     Summary: BILATERAL: - No evidence of deep vein thrombosis seen in the lower extremities, bilaterally. - No evidence of superficial venous thrombosis in the lower extremities, bilaterally. -No evidence of popliteal cyst, bilaterally.   *See table(s) above for measurements and observations.    Preliminary         Scheduled Meds: . aspirin EC  81  mg Oral Daily  . [START ON 09/17/2020] clopidogrel  75 mg Oral Daily  .  enoxaparin (LOVENOX) injection  40 mg Subcutaneous QHS  . sodium chloride flush  3 mL Intravenous Once  . valACYclovir  500 mg Oral Daily   Continuous Infusions: . sodium chloride 75 mL/hr at 09/16/20 1840     LOS: 0 days    Time spent: 29mins    Kathie Dike, MD [facility]   If 7PM-7AM, please contact night-coverage [email or web address]  09/16/2020, 7:05 PM

## 2020-09-16 NOTE — Progress Notes (Signed)
  Echocardiogram 2D Echocardiogram has been performed.  Michiel Cowboy 09/16/2020, 8:53 AM

## 2020-09-16 NOTE — Progress Notes (Signed)
09/16/20 1452  PT Visit Information  Last PT Received On 09/16/20  Assistance Needed +1  History of Present Illness Pt is a 55 y.o. female admitted 09/15/20 for confusion, facial droop, slurred speech. 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 PMH includes: HTN (off meds for the past week due to financial issues), chronic paresthesias of upper and lower extremities (unsure of etiology).  Precautions  Precautions Fall  Restrictions  Weight Bearing Restrictions No  Home Living  Family/patient expects to be discharged to: Private residence  Living Arrangements Alone  Available Help at Discharge Friend(s);Available PRN/intermittently  Type of Home Other(Comment) (townhome)  Home Access Stairs to enter  Entrance Stairs-Number of Steps 3  Entrance Stairs-Rails None  Home Layout Two level  Alternate Level Stairs-Number of Steps flight  Alternate Level Stairs-Rails Left  Bathroom Shower/Tub Tub/shower unit  Automotive engineer None  Prior Function  Level of Independence Independent  Comments works part time in [place]. still drives, has 2 cats  Communication  Communication No difficulties  Pain Assessment  Pain Assessment Faces  Faces Pain Scale 2  Pain Location Chronic back pain, tingling in Bil hands and feet, hx of sciatica and plantar facitis  Pain Descriptors / Indicators Constant;Tingling;Guarding  Pain Intervention(s) Limited activity within patient's tolerance;Monitored during session;Repositioned  Cognition  Arousal/Alertness Awake/alert  Behavior During Therapy WFL for tasks assessed/performed  Overall Cognitive Status Impaired/Different from baseline  Area of Impairment Problem solving;Safety/judgement  Safety/Judgement Decreased awareness of safety (line management and awareness)  Problem Solving Requires verbal cues  General Comments very mild, decreased awareness, very small sequencing cues (potentially environmental)  Upper Extremity  Assessment  Upper Extremity Assessment Defer to OT evaluation  Lower Extremity Assessment  Lower Extremity Assessment RLE deficits/detail;LLE deficits/detail  RLE Deficits / Details Reports plantar fasciitis pain and tingling from the knee down to feet.  LLE Deficits / Details Reports plantar fasciitis pain and tingling from the knee down to feet. Reports hx of sciatica.  Cervical / Trunk Assessment  Cervical / Trunk Assessment Normal  Bed Mobility  Overal bed mobility Modified Independent  General bed mobility comments no safety awareness of lines  Transfers  Overall transfer level Modified independent  General transfer comment no LOB  Ambulation/Gait  Ambulation/Gait assistance Supervision  Gait Distance (Feet) 200 Feet  Assistive device None  Gait Pattern/deviations Step-through pattern;Decreased stride length  General Gait Details Overall steady with dynamic gait tasks. No overt LOB Noted. Supervision for safety.  Gait velocity Decreased  Stairs Yes  Stairs assistance Supervision  Stair Management One rail Right;Step to pattern;Forwards  Number of Stairs 10  General stair comments No LOB noted with stair navigation. Supervision  for safety.  Modified Rankin (Stroke Patients Only)  Pre-Morbid Rankin Score 0  Modified Rankin 1  Balance  Overall balance assessment No apparent balance deficits (not formally assessed)  Standardized Balance Assessment  Standardized Balance Assessment  Dynamic Gait Index  Dynamic Gait Index  Level Surface 3  Change in Gait Speed 3  Gait with Horizontal Head Turns 3  Gait with Vertical Head Turns 2  Gait and Pivot Turn 2  Step Over Obstacle 3  Step Around Obstacles 3  Steps 2  Total Score 21  Exercises  Exercises Other exercises  Other Exercises  Other Exercises Educated about calf stretching in supine with use of sheet  Other Exercises Educated about big toe stretching to help with Plantar fasciitis  Other Exercises Educated about nerve  glides to  assist with sciatic pain  PT - End of Session  Equipment Utilized During Treatment Gait belt  Activity Tolerance Patient tolerated treatment well  Patient left in bed;with call bell/phone within reach;with family/visitor present (with OT present)  Nurse Communication Mobility status  PT Assessment  PT Recommendation/Assessment Patent does not need any further PT services  PT Visit Diagnosis Other abnormalities of gait and mobility (R26.89);Other symptoms and signs involving the nervous system (R29.898)  No Skilled PT All education completed;Patient is supervision for all activity/mobility  AM-PAC PT "6 Clicks" Mobility Outcome Measure (Version 2)  Help needed turning from your back to your side while in a flat bed without using bedrails? 4  Help needed moving from lying on your back to sitting on the side of a flat bed without using bedrails? 4  Help needed moving to and from a bed to a chair (including a wheelchair)? 4  Help needed standing up from a chair using your arms (e.g., wheelchair or bedside chair)? 4  Help needed to walk in hospital room? 4  Help needed climbing 3-5 steps with a railing?  3  6 Click Score 23  Consider Recommendation of Discharge To: Home with no services  PT Recommendation  Follow Up Recommendations No PT follow up  PT equipment None recommended by PT  Acute Rehab PT Goals  Patient Stated Goal to get home by tomorrow  PT Goal Formulation With patient  Time For Goal Achievement 09/16/20  Potential to Achieve Goals Good  PT Time Calculation  PT Start Time (ACUTE ONLY) 1259  PT Stop Time (ACUTE ONLY) 1320  PT Time Calculation (min) (ACUTE ONLY) 21 min  PT General Charges  $$ ACUTE PT VISIT 1 Visit  PT Evaluation  $PT Eval Low Complexity 1 Low  Written Expression  Dominant Hand Right   Pt admitted secondary to problem above with deficits below. Pt overall requiring supervision for safety during mobility tasks. Scored 21 on DGI indicating low  fall risk. Educated about HEP to help with sciatic pain and plantar fasciitis. Pt asking about exercises for hands, so will defer to OT for their recommendations. Feel pt with no further skilled PT needs at this time, as she is reporting she is ambulating close to baseline. Will sign off. If needs change, please re-consult.   Reuel Derby, PT, DPT  Acute Rehabilitation Services  Pager: [phone] Office: [phone]

## 2020-09-16 NOTE — Progress Notes (Signed)
MSW Student spoke with patient and her friends about their concern about her lack of insurance. Student told patient that she would email financial services and that they would probably be able to see them tomorrow. Patient had no other needs.

## 2020-09-16 NOTE — ED Notes (Signed)
Pt provided with [place] sandwich and coke.

## 2020-09-16 NOTE — CV Procedure (Signed)
Carotid Duplex completed  Results can be found under chart review under CV PROC. 09/16/2020 9:40 AM Eryk Beavers RVT, RDMS

## 2020-09-17 ENCOUNTER — Inpatient Hospital Stay (HOSPITAL_COMMUNITY): Payer: Self-pay

## 2020-09-17 LAB — ANA: Anti Nuclear Antibody (ANA): NEGATIVE

## 2020-09-17 LAB — ANCA TITERS
Atypical P-ANCA titer: <1:20 {titer}
C-ANCA: <1:20 {titer}
P-ANCA: <1:20 {titer}

## 2020-09-17 LAB — RPR: RPR Ser Ql: NONREACTIVE

## 2020-09-17 MED ORDER — ATORVASTATIN CALCIUM 80 MG PO TABS
80.0000 mg | ORAL_TABLET | Freq: Every day | ORAL | Status: DC
  Administered 2020-09-17: 80 mg via ORAL
  Filled 2020-09-17: qty 1

## 2020-09-17 MED ORDER — GABAPENTIN 100 MG PO CAPS
100.0000 mg | ORAL_CAPSULE | Freq: Three times a day (TID) | ORAL | Status: DC
  Administered 2020-09-17 – 2020-09-18 (×5): 100 mg via ORAL
  Filled 2020-09-17 (×5): qty 1

## 2020-09-17 MED ORDER — LORAZEPAM 2 MG/ML IJ SOLN
2.0000 mg | Freq: Once | INTRAMUSCULAR | Status: AC
  Administered 2020-09-17: 2 mg via INTRAVENOUS
  Filled 2020-09-17 (×2): qty 1

## 2020-09-17 NOTE — Progress Notes (Signed)
STROKE TEAM PROGRESS NOTE   Brief HPI:  Reports symptoms  started 3/24 and into 3/25 and worsened progressively until she presented yesterday.  Noticed vision was off when driving. She thinks she was having a "usual headache" but not her typical migraine when symptoms onset. Had trouble reading road signs. Reports poor depth perception. Then started running into  furniture and walls on 3/28. Felt like she was intoxicated with "head swimming around".  0n 3/29 she reports developing "gibberish" speech and facial droop. Reports tingling in arms and legs for several days below elbows to fingers and below knees to toes. Sensation in her left leg was like something was there making her think she had a clot in her thigh. Has had rashes off intermittently. Friend made her come to the ED yesterday.   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 She recalls that the neurologist could not figure out what was wrong despite multiple tests.   Reports hx of severe migraines and  "usual" recurrent headaches. Triptan did not help. Prednisone helped.   Had a heart murmur as a child. Otherwise denies any heart history. Denies hx of blood clots or miscarriages.   INTERVAL HISTORY VSS. Afebrile. Unremarkable CRP and sed rate.  Denies new symptoms but continues to report bilat upper extremity parasthesias and asks for treatment . No headache today.  Friend at bedside.   We discussed options for continuing diagnostic work up. She reports anxiety regarding returning to complete brain MRI.  MRI with sedation offered vs. uutpatient open MRI. She agreed to try to complete scan with ativan IV on call for procedure.  We discussed stroke diagnosis, work up and plan of care. Questions addressed.    Vitals:   09/16/20 1935 09/16/20 1942 09/16/20 2348 09/17/20 0357  BP: 116/84 112/75 125/72 122/75  Pulse: 90 90 67 61  Resp: _0 Temp: 98.8 F (37.1 C) 97.6 F (36.4 C) 98.5 F  (36.9 C) 98.1 F (36.7 C)  TempSrc: Oral Oral Oral Oral  SpO2: 98% 100% 100% 97%  Weight:      Height:       CBC:  Recent Labs  Lab 09/15/20 1628 09/16/20 0211  WBC 6.0 4.9  NEUTROABS 2.3  --   HGB 14.5 13.2  HCT 41.9 38.7  MCV 93.7 93.5  PLT 281 511   Basic Metabolic Panel:  Recent Labs  Lab 09/15/20 1628 09/16/20 0211  NA 140  --   K 3.6  --   CL 105  --   CO2 26  --   GLUCOSE 111*  --   BUN 12  --   CREATININE 0.57 0.62  CALCIUM 9.4  --    Lipid Panel:  Recent Labs  Lab 09/16/20 0211  CHOL 191  TRIG 75  HDL 43  CHOLHDL 4.4  VLDL 15  LDLCALC 133*   HgbA1c:  Recent Labs  Lab 09/16/20 0211  HGBA1C 5.7*   Urine Drug Screen:  Recent Labs  Lab 09/15/20 1848  LABOPIA NONE DETECTED  COCAINSCRNUR NONE DETECTED  LABBENZ NONE DETECTED  AMPHETMU NONE DETECTED  THCU NONE DETECTED  LABBARB NONE DETECTED    Alcohol Level  Recent Labs  Lab 09/15/20 1848  ETH <10    IMAGING past 24 hours ECHOCARDIOGRAM COMPLETE  Result Date: 09/16/2020    ECHOCARDIOGRAM REPORT   Patient Name:   Sarah Simpson Date of Exam: 09/16/2020 Medical Rec #:  [id number]  Height:       66.0 in Accession #:    [id number]        Weight:       194.0 lb Date of Birth:  1965/12/06         BSA:          1.974 m Patient Age:    60 years          BP:           127/71 mmHg Patient Gender: F                 HR:           78 bpm. Exam Location:  Inpatient Procedure: 2D Echo, Cardiac Doppler and Color Doppler Indications:    Stroke I63.9  History:        Patient has no prior history of Echocardiogram examinations.                 Signs/Symptoms:Murmur; Risk Factors:Hypertension.  Sonographer:    Vickie Epley RDCS Referring Phys: [place]  Sonographer Comments: Image acquisition challenging due to respiratory motion. IMPRESSIONS  1. Left ventricular ejection fraction, by estimation, is 60 to 65%. The left ventricle has normal function. The left ventricle has no regional wall  motion abnormalities. Left ventricular diastolic parameters were normal.  2. Right ventricular systolic function is normal. The right ventricular size is normal.  3. The mitral valve is normal in structure. Trivial mitral valve regurgitation. No evidence of mitral stenosis.  4. The aortic valve was not well visualized. Aortic valve regurgitation is trivial. No aortic stenosis is present.  5. The inferior vena cava is normal in size with greater than 50% respiratory variability, suggesting right atrial pressure of 3 mmHg. FINDINGS  Left Ventricle: Left ventricular ejection fraction, by estimation, is 60 to 65%. The left ventricle has normal function. The left ventricle has no regional wall motion abnormalities. The left ventricular internal cavity size was normal in size. There is  no left ventricular hypertrophy. Left ventricular diastolic parameters were normal. Right Ventricle: The right ventricular size is normal. No increase in right ventricular wall thickness. Right ventricular systolic function is normal. Left Atrium: Left atrial size was normal in size. Right Atrium: Right atrial size was normal in size. Pericardium: There is no evidence of pericardial effusion. Mitral Valve: The mitral valve is normal in structure. Trivial mitral valve regurgitation. No evidence of mitral valve stenosis. Tricuspid Valve: The tricuspid valve is normal in structure. Tricuspid valve regurgitation is not demonstrated. No evidence of tricuspid stenosis. Aortic Valve: The aortic valve was not well visualized. Aortic valve regurgitation is trivial. No aortic stenosis is present. Pulmonic Valve: The pulmonic valve was normal in structure. Pulmonic valve regurgitation is not visualized. No evidence of pulmonic stenosis. Aorta: The aortic root is normal in size and structure. Venous: The inferior vena cava is normal in size with greater than 50% respiratory variability, suggesting right atrial pressure of 3 mmHg. IAS/Shunts: No atrial  level shunt detected by color flow Doppler.  LEFT VENTRICLE PLAX 2D LVIDd:         4.80 cm      Diastology LVIDs:         3.00 cm      LV e' medial:    8.27 cm/s LV PW:         0.70 cm      LV E/e' medial:  9.3 LV IVS:  0.70 cm      LV e' lateral:   9.68 cm/s LVOT diam:     2.00 cm      LV E/e' lateral: 8.0 LV SV:         93 LV SV Index:   47 LVOT Area:     3.14 cm  LV Volumes (MOD) LV vol d, MOD A2C: 112.0 ml LV vol d, MOD A4C: 104.0 ml LV vol s, MOD A2C: 42.6 ml LV vol s, MOD A4C: 38.8 ml LV SV MOD A2C:     69.4 ml LV SV MOD A4C:     104.0 ml LV SV MOD BP:      67.9 ml RIGHT VENTRICLE RV S prime:     13.40 cm/s TAPSE (M-mode): 1.9 cm LEFT ATRIUM             Index       RIGHT ATRIUM          Index LA diam:        3.60 cm 1.82 cm/m  RA Area:     9.90 cm LA Vol (A2C):   36.6 ml 18.54 ml/m RA Volume:   19.00 ml 9.62 ml/m LA Vol (A4C):   33.0 ml 16.72 ml/m LA Biplane Vol: 36.9 ml 18.69 ml/m  AORTIC VALVE LVOT Vmax:   135.00 cm/s LVOT Vmean:  94.600 cm/s LVOT VTI:    0.295 m  AORTA Ao Root diam: 3.00 cm MITRAL VALVE MV Area (PHT): 3.91 cm    SHUNTS MV Decel Time: 194 msec    Systemic VTI:  0.30 m MV E velocity: 77.10 cm/s  Systemic Diam: 2.00 cm MV A velocity: 66.80 cm/s MV E/A ratio:  1.15 Jenkins Rouge MD Electronically signed by Jenkins Rouge MD Signature Date/Time: 09/16/2020/9:57:54 AM    Final    VAS US CAROTID  Result Date: 09/16/2020 Carotid Arterial Duplex Study Indications:       CVA, Speech disturbance, Visual disturbance and Dizziness. Risk Factors:      Hypertension, hyperlipidemia, past history of smoking. Other Factors:     Abrupt stop to HTN medication due to insurance reasons. Comparison Study:  No previous exams Performing Technologist: Rogelia Rohrer  Examination Guidelines: A complete evaluation includes B-mode imaging, spectral Doppler, color Doppler, and power Doppler as needed of all accessible portions of each vessel. Bilateral testing is considered an integral part of a complete  examination. Limited examinations for reoccurring indications may be performed as noted.  Right Carotid Findings: +----------+--------+--------+--------+------------------+------------------+           PSV cm/sEDV cm/sStenosisPlaque DescriptionComments           +----------+--------+--------+--------+------------------+------------------+ CCA Prox  94      16                                intimal thickening +----------+--------+--------+--------+------------------+------------------+ CCA Distal84      19                                intimal thickening +----------+--------+--------+--------+------------------+------------------+ ICA Prox  62      17                                                   +----------+--------+--------+--------+------------------+------------------+  ICA Distal63      21                                                   +----------+--------+--------+--------+------------------+------------------+ ECA       107     23                                                   +----------+--------+--------+--------+------------------+------------------+ +----------+--------+-------+----------------+-------------------+           PSV cm/sEDV cmsDescribe        Arm Pressure (mmHG) +----------+--------+-------+----------------+-------------------+ [id number]      0      Multiphasic, WNL                    +----------+--------+-------+----------------+-------------------+ +---------+--------+--+--------+--+---------+ VertebralPSV cm/s40EDV cm/s10Antegrade +---------+--------+--+--------+--+---------+  Left Carotid Findings: +----------+--------+--------+--------+------------------+------------------+           PSV cm/sEDV cm/sStenosisPlaque DescriptionComments           +----------+--------+--------+--------+------------------+------------------+ CCA Prox  85      14                                intimal thickening  +----------+--------+--------+--------+------------------+------------------+ CCA Distal107     19                                intimal thickening +----------+--------+--------+--------+------------------+------------------+ ICA Prox  77      23                                                   +----------+--------+--------+--------+------------------+------------------+ ICA Distal83      23                                                   +----------+--------+--------+--------+------------------+------------------+ ECA       88      13                                                   +----------+--------+--------+--------+------------------+------------------+ +----------+--------+--------+----------------+-------------------+           PSV cm/sEDV cm/sDescribe        Arm Pressure (mmHG) +----------+--------+--------+----------------+-------------------+ Subclavian178     14      Multiphasic, WNL                    +----------+--------+--------+----------------+-------------------+ +---------+--------+--+--------+-+---------+ VertebralPSV cm/s34EDV cm/s8Antegrade +---------+--------+--+--------+-+---------+   Summary: Right Carotid: The extracranial vessels were near-normal with only minimal wall                thickening or plaque. Left Carotid: The extracranial vessels were near-normal with only minimal wall  thickening or plaque. Vertebrals:  Bilateral vertebral arteries demonstrate antegrade flow. Subclavians: Normal flow hemodynamics were seen in bilateral subclavian              arteries. *See table(s) above for measurements and observations.  Electronically signed by Antony Contras MD on 09/16/2020 at 1:36:20 PM.    Final    VAS [place] LOWER EXTREMITY VENOUS (DVT)  Result Date: 09/16/2020  Lower Venous DVT Study Indications: Stroke.  Risk Factors: DVT HX (per patient). Comparison Study: No previous exams Performing Technologist: Rogelia Rohrer   Examination Guidelines: A complete evaluation includes B-mode imaging, spectral Doppler, color Doppler, and power Doppler as needed of all accessible portions of each vessel. Bilateral testing is considered an integral part of a complete examination. Limited examinations for reoccurring indications may be performed as noted. The reflux portion of the exam is performed with the patient in reverse Trendelenburg.  +---------+---------------+---------+-----------+----------+--------------+ RIGHT    CompressibilityPhasicitySpontaneityPropertiesThrombus Aging +---------+---------------+---------+-----------+----------+--------------+ CFV      Full           Yes      Yes                                 +---------+---------------+---------+-----------+----------+--------------+ SFJ      Full                                                        +---------+---------------+---------+-----------+----------+--------------+ FV Prox  Full           Yes      Yes                                 +---------+---------------+---------+-----------+----------+--------------+ FV Mid   Full           Yes      Yes                                 +---------+---------------+---------+-----------+----------+--------------+ FV DistalFull           Yes      Yes                                 +---------+---------------+---------+-----------+----------+--------------+ PFV      Full                                                        +---------+---------------+---------+-----------+----------+--------------+ POP      Full           Yes      Yes                                 +---------+---------------+---------+-----------+----------+--------------+ PTV      Full                                                        +---------+---------------+---------+-----------+----------+--------------+  PERO     Full                                                         +---------+---------------+---------+-----------+----------+--------------+   +---------+---------------+---------+-----------+----------+--------------+ LEFT     CompressibilityPhasicitySpontaneityPropertiesThrombus Aging +---------+---------------+---------+-----------+----------+--------------+ CFV      Full           Yes      Yes                                 +---------+---------------+---------+-----------+----------+--------------+ SFJ      Full                                                        +---------+---------------+---------+-----------+----------+--------------+ FV Prox  Full           Yes      Yes                                 +---------+---------------+---------+-----------+----------+--------------+ FV Mid   Full           Yes      Yes                                 +---------+---------------+---------+-----------+----------+--------------+ FV DistalFull           Yes      Yes                                 +---------+---------------+---------+-----------+----------+--------------+ PFV      Full                                                        +---------+---------------+---------+-----------+----------+--------------+ POP      Full           Yes      Yes                                 +---------+---------------+---------+-----------+----------+--------------+ PTV      Full                                                        +---------+---------------+---------+-----------+----------+--------------+ PERO     Full                                                        +---------+---------------+---------+-----------+----------+--------------+  Summary: BILATERAL: - No evidence of deep vein thrombosis seen in the lower extremities, bilaterally. - No evidence of superficial venous thrombosis in the lower extremities, bilaterally. -No evidence of popliteal cyst, bilaterally.   *See table(s) above for measurements  and observations. Electronically signed by Harold Barban MD on 09/16/2020 at 10:41:59 PM.    Final    PHYSICAL EXAM Constitutional: Obese middle-aged Caucasian lady not in distress  psych: Affect appropriate to situation Eyes: No scleral injection HENT: No OP obstruction MSK: no joint deformities.  Cardiovascular: Normal rate and regular rhythm.  Respiratory: Effort normal, non-labored breathing GI: Soft.  No distension. There is no tenderness.  Skin: WDI  Neuro: Mental Status: Patient is awake, alert, oriented to person, place, month, year, and situation. Patient is able to give a clear and coherent history. No signs of aphasia or neglect Cranial Nerves: II: Visual Fields are full. Pupils are equal, round, and reactive to light.   III,IV, VI: EOMI without ptosis or diploplia.  V: Facial sensation is symmetric to temperature VII: Facial movement is symmetric.  But has trace right lower facial asymmetry when she smiles. VIII: hearing is intact to voice X: Uvula elevates symmetrically XI: Shoulder shrug is symmetric. XII: tongue is midline without atrophy or fasciculations.  Motor: Tone is normal. Bulk is normal. 5/5 strength was present in all four extremities.  Sensory: Sensation is symmetric to light touch and temperature in the arms and legs. Deep Tendon Reflexes: 2+ and symmetric in the biceps and patellae.  Plantars: Toes are downgoing bilaterally.  Cerebellar: She has slow and deliberate movements on finger-nose-finger bilaterally, but does not pass point  ASSESSMENT/PLAN 55 year old female with a history of hypertension, seizure like episodes in her 21s, heart murmur, migraine headaches, chronic parasthesias bilat hands, depression, RLS, ADD who presented with facial droop that started 3/28.  She also noticed that she was having some trouble with her speaking, saying words that she did not mean to say and "saying gibberish" but that this is improved considerably since  yesterday. Paresthesias could represent central or peripheral pathology.  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 There is no acute intracranial hemorrhage.  MRI brain Diffusion abnormality in the caudate tail on the left most consistent with stroke.  MRA head Truncated examination. Small acute infarct of the left caudate tail. Normal intracranial MRA.  Carotid Doppler Nearly normal bilaterally, no high grade stenosis  2D Echo EF 60-65%, No thrombus, no wall motion abnormality or shunt found.   Bilat LE Doppler no evidence of DVT   LDL 133  HgbA1c 5.7  VTE prophylaxis - Lovenox ppx    Diet   Diet Heart Room service appropriate? Yes; Fluid consistency: Thin   No antiplatelet or anticoagulant medications prior to admission  Recommend DAPT with ASA 72m and Plavix 793mx 3 weeks then ASA 8165monotherapy  TEE ( 1100 tomorrow) and loop placement pending   Therapy recommendations:  Pending  Disposition:  TBD  Hypertension  Stable . Permissive hypertension (OK if < 220/120) but gradually normalize in 5-7 days . Long-term BP goal normotensive  Hyperlipidemia  Home meds: None  LDL 133, not at goal < 70  High intensity statin: Lipitor 20m14mContinue statin at discharge  Bilateral parasthesias/Headaches/Rashes   Stroke diagnosis does not explain all of her reported symptoms   Vasculitic labs:  ESR and CRP WNL, otherwise are pending  Patient chose to attempt MR of brain and C spine with IV ativan x one for sedation today  Other Stroke Risk Factors  Former Cigarette smoker.  Current ETOH use, alcohol level <10, advised to drink no more than 1 drink a day  Obesity, Body mass index is 35.19 kg/m., BMI >/= 30 associated with increased stroke risk, recommend weight loss, diet and exercise as appropriate    Migraines Other Active Problems     Hospital day # 1 Plan check MRI scan of the brain and cervical spine under sedation since patient is having persistent symptoms of paresthesias in all 4 limbs to rule out cervical cord lesion and the previous brain MRI was incomplete.  Follow vasculitic labs.  Long discussion with the patient and her friend at the bedside and answered questions.  Greater than 50% time during this 25-minute visit was spent in counseling and coordination of care about diagnostic testing and answering questions.  Antony Contras MD  To contact Stroke Continuity provider, please refer to [email or web address] After hours, contact General Neurology

## 2020-09-17 NOTE — Evaluation (Signed)
Speech Language Pathology Evaluation Patient Details Name: Lindsi Bayliss MRN: [id number] DOB: January 04, 1966 Today's Date: 09/17/2020 Time: 1510-1530 SLP Time Calculation (min) (ACUTE ONLY): 20 min  Problem List:  Patient Active Problem List   Diagnosis Date Noted  . 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 Acute CVA (cerebrovascular accident) ([place]) 09/15/2020  . Essential hypertension 09/15/2020   Past Medical History:  Past Medical History:  Diagnosis Date  . ADD (attention deficit disorder)   . Allergy   . Anemia   . Arthritis    OA back   . Cyst of right ovary   . Depression   . Heart murmur   . History of kidney stones   . Hypertension   . Neuromuscular disorder (HCC)    RLS  . Restless leg syndrome   . Seizures ([place])    in her 48's- none since-? etiology    Past Surgical History:  Past Surgical History:  Procedure Laterality Date  . APPENDECTOMY  2008  . BUNIONECTOMY  2009   x 2  . WISDOM TOOTH EXTRACTION     HPI:  Pt is a 55 y.o. female admitted 09/15/20 for confusion, facial droop, slurred speech. 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 PMH includes: HTN (off meds for the past week due to financial issues), chronic paresthesias of upper and lower extremities (unsure of etiology).   Assessment / Plan / Recommendation Clinical Impression  Patient seen for speech-language and cognitive evaluation however this was complicated by patient's apparent sedation from recent medication (Ativan) when she had MRI earlier today. Patient reported that she currently feels "loopy" from [employer] She also reported that the main things she has noticed as far as her speech and language is she has been having difficulty with word finding with "medication words" but sluring of speech and facial dropping have both subsided. SLP started to administer SLUMS ([place] Mental Status Exam). She was oriented to day of week and place but when asked year, she became very  disorganized and tangential in her thoughts, saying "its the last day of the year" (meaning last day of month) then saying "its May". Patient named 9 animals in 60 seconds and required frequent cues to continue naming; she did not recall any of the 5 words, instead started naming animals. Even when SLP redirected her from naming animals again, she then continued to do so. SLP and patient both in agreement that accurate testing cannot be completed today as she is still under the effects of sedating medications. Plan for repeat SLE next date.    SLP Assessment  SLP Recommendation/Assessment: Patient needs continued Speech Lanaguage Pathology Services SLP Visit Diagnosis: Cognitive communication deficit (R41.841)    Follow Up Recommendations  Other (comment) (TBD)    Frequency and Duration min 1 x/week  1 week      SLP Evaluation Cognition  Overall Cognitive Status: Impaired/Different from baseline Arousal/Alertness: Awake/alert Orientation Level: Oriented to person;Oriented to place;Oriented to situation;Disoriented to time;Other (comment) (correct for year, day of week, states month as May) Attention: Focused Focused Attention: Impaired Focused Attention Impairment: Verbal basic Memory: Impaired Memory Impairment: Storage deficit;Retrieval deficit Memory Recall Sock: Not able to recall Memory Recall Blue: Not able to recall Memory Recall Bed: Not able to recall Problem Solving: Impaired Problem Solving Impairment: Verbal basic;Verbal complex Executive Function: Organizing Organizing: Impaired Organizing Impairment: Verbal basic;Verbal complex Behaviors: Other (comment) (appears somewhat sedated (recently had Ativan))       Comprehension  Auditory Comprehension Overall Auditory Comprehension:  Impaired Interfering Components: Attention;Processing speed EffectiveTechniques: Extra processing time;Pausing;Repetition Visual Recognition/Discrimination Discrimination: Not  tested Reading Comprehension Reading Status: Not tested    Expression Expression Primary Mode of Expression: Verbal Verbal Expression Overall Verbal Expression: Impaired Initiation: No impairment Level of Generative/Spontaneous Verbalization: Conversation Repetition: No impairment Naming: Impairment Responsive: 76-100% accurate Confrontation: Within functional limits Divergent: 50-74% accurate Verbal Errors: Perseveration;Semantic paraphasias;Other (comment) (not consistently aware of errors) Pragmatics: Impairment Impairments: Abnormal affect;Topic maintenance Interfering Components: Attention Effective Techniques: Open ended questions Non-Verbal Means of Communication: Not applicable Written Expression Dominant Hand: Right Written Expression: Not tested   Oral / Motor  Oral Motor/Sensory Function Overall Oral Motor/Sensory Function: Within functional limits Motor Speech Overall Motor Speech: Appears within functional limits for tasks assessed   GO                    Sonia Baller, MA, CCC-SLP Speech Therapy [facility]

## 2020-09-17 NOTE — H&P (View-Only) (Signed)
STROKE TEAM PROGRESS NOTE   Brief HPI:  Reports symptoms  started 3/24 and into 3/25 and worsened progressively until she presented yesterday.  Noticed vision was off when driving. She thinks she was having a "usual headache" but not her typical migraine when symptoms onset. Had trouble reading road signs. Reports poor depth perception. Then started running into  furniture and walls on 3/28. Felt like she was intoxicated with "head swimming around".  0n 3/29 she reports developing "gibberish" speech and facial droop. Reports tingling in arms and legs for several days below elbows to fingers and below knees to toes. Sensation in her left leg was like something was there making her think she had a clot in her thigh. Has had rashes off intermittently. Friend made her come to the ED yesterday.   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 She recalls that the neurologist could not figure out what was wrong despite multiple tests.   Reports hx of severe migraines and  "usual" recurrent headaches. Triptan did not help. Prednisone helped.   Had a heart murmur as a child. Otherwise denies any heart history. Denies hx of blood clots or miscarriages.   INTERVAL HISTORY VSS. Afebrile. Unremarkable CRP and sed rate.  Denies new symptoms but continues to report bilat upper extremity parasthesias and asks for treatment . No headache today.  Friend at bedside.   We discussed options for continuing diagnostic work up. She reports anxiety regarding returning to complete brain MRI.  MRI with sedation offered vs. uutpatient open MRI. She agreed to try to complete scan with ativan IV on call for procedure.  We discussed stroke diagnosis, work up and plan of care. Questions addressed.    Vitals:   09/16/20 1935 09/16/20 1942 09/16/20 2348 09/17/20 0357  BP: 116/84 112/75 125/72 122/75  Pulse: 90 90 67 61  Resp: _0 Temp: 98.8 F (37.1 C) 97.6 F (36.4 C) 98.5 F  (36.9 C) 98.1 F (36.7 C)  TempSrc: Oral Oral Oral Oral  SpO2: 98% 100% 100% 97%  Weight:      Height:       CBC:  Recent Labs  Lab 09/15/20 1628 09/16/20 0211  WBC 6.0 4.9  NEUTROABS 2.3  --   HGB 14.5 13.2  HCT 41.9 38.7  MCV 93.7 93.5  PLT 281 511   Basic Metabolic Panel:  Recent Labs  Lab 09/15/20 1628 09/16/20 0211  NA 140  --   K 3.6  --   CL 105  --   CO2 26  --   GLUCOSE 111*  --   BUN 12  --   CREATININE 0.57 0.62  CALCIUM 9.4  --    Lipid Panel:  Recent Labs  Lab 09/16/20 0211  CHOL 191  TRIG 75  HDL 43  CHOLHDL 4.4  VLDL 15  LDLCALC 133*   HgbA1c:  Recent Labs  Lab 09/16/20 0211  HGBA1C 5.7*   Urine Drug Screen:  Recent Labs  Lab 09/15/20 1848  LABOPIA NONE DETECTED  COCAINSCRNUR NONE DETECTED  LABBENZ NONE DETECTED  AMPHETMU NONE DETECTED  THCU NONE DETECTED  LABBARB NONE DETECTED    Alcohol Level  Recent Labs  Lab 09/15/20 1848  ETH <10    IMAGING past 24 hours ECHOCARDIOGRAM COMPLETE  Result Date: 09/16/2020    ECHOCARDIOGRAM REPORT   Patient Name:   Sarah Simpson Date of Exam: 09/16/2020 Medical Rec #:  [id number]  Height:       66.0 in Accession #:    [id number]        Weight:       194.0 lb Date of Birth:  1965/12/06         BSA:          1.974 m Patient Age:    55 years          BP:           127/71 mmHg Patient Gender: F                 HR:           78 bpm. Exam Location:  Inpatient Procedure: 2D Echo, Cardiac Doppler and Color Doppler Indications:    Stroke I63.9  History:        Patient has no prior history of Echocardiogram examinations.                 Signs/Symptoms:Murmur; Risk Factors:Hypertension.  Sonographer:    Vickie Epley RDCS Referring Phys: [place]  Sonographer Comments: Image acquisition challenging due to respiratory motion. IMPRESSIONS  1. Left ventricular ejection fraction, by estimation, is 60 to 65%. The left ventricle has normal function. The left ventricle has no regional wall  motion abnormalities. Left ventricular diastolic parameters were normal.  2. Right ventricular systolic function is normal. The right ventricular size is normal.  3. The mitral valve is normal in structure. Trivial mitral valve regurgitation. No evidence of mitral stenosis.  4. The aortic valve was not well visualized. Aortic valve regurgitation is trivial. No aortic stenosis is present.  5. The inferior vena cava is normal in size with greater than 50% respiratory variability, suggesting right atrial pressure of 3 mmHg. FINDINGS  Left Ventricle: Left ventricular ejection fraction, by estimation, is 60 to 65%. The left ventricle has normal function. The left ventricle has no regional wall motion abnormalities. The left ventricular internal cavity size was normal in size. There is  no left ventricular hypertrophy. Left ventricular diastolic parameters were normal. Right Ventricle: The right ventricular size is normal. No increase in right ventricular wall thickness. Right ventricular systolic function is normal. Left Atrium: Left atrial size was normal in size. Right Atrium: Right atrial size was normal in size. Pericardium: There is no evidence of pericardial effusion. Mitral Valve: The mitral valve is normal in structure. Trivial mitral valve regurgitation. No evidence of mitral valve stenosis. Tricuspid Valve: The tricuspid valve is normal in structure. Tricuspid valve regurgitation is not demonstrated. No evidence of tricuspid stenosis. Aortic Valve: The aortic valve was not well visualized. Aortic valve regurgitation is trivial. No aortic stenosis is present. Pulmonic Valve: The pulmonic valve was normal in structure. Pulmonic valve regurgitation is not visualized. No evidence of pulmonic stenosis. Aorta: The aortic root is normal in size and structure. Venous: The inferior vena cava is normal in size with greater than 50% respiratory variability, suggesting right atrial pressure of 3 mmHg. IAS/Shunts: No atrial  level shunt detected by color flow Doppler.  LEFT VENTRICLE PLAX 2D LVIDd:         4.80 cm      Diastology LVIDs:         3.00 cm      LV e' medial:    8.27 cm/s LV PW:         0.70 cm      LV E/e' medial:  9.3 LV IVS:  0.70 cm      LV e' lateral:   9.68 cm/s LVOT diam:     2.00 cm      LV E/e' lateral: 8.0 LV SV:         93 LV SV Index:   47 LVOT Area:     3.14 cm  LV Volumes (MOD) LV vol d, MOD A2C: 112.0 ml LV vol d, MOD A4C: 104.0 ml LV vol s, MOD A2C: 42.6 ml LV vol s, MOD A4C: 38.8 ml LV SV MOD A2C:     69.4 ml LV SV MOD A4C:     104.0 ml LV SV MOD BP:      67.9 ml RIGHT VENTRICLE RV S prime:     13.40 cm/s TAPSE (M-mode): 1.9 cm LEFT ATRIUM             Index       RIGHT ATRIUM          Index LA diam:        3.60 cm 1.82 cm/m  RA Area:     9.90 cm LA Vol (A2C):   36.6 ml 18.54 ml/m RA Volume:   19.00 ml 9.62 ml/m LA Vol (A4C):   33.0 ml 16.72 ml/m LA Biplane Vol: 36.9 ml 18.69 ml/m  AORTIC VALVE LVOT Vmax:   135.00 cm/s LVOT Vmean:  94.600 cm/s LVOT VTI:    0.295 m  AORTA Ao Root diam: 3.00 cm MITRAL VALVE MV Area (PHT): 3.91 cm    SHUNTS MV Decel Time: 194 msec    Systemic VTI:  0.30 m MV E velocity: 77.10 cm/s  Systemic Diam: 2.00 cm MV A velocity: 66.80 cm/s MV E/A ratio:  1.15 Jenkins Rouge MD Electronically signed by Jenkins Rouge MD Signature Date/Time: 09/16/2020/9:57:54 AM    Final    VAS US CAROTID  Result Date: 09/16/2020 Carotid Arterial Duplex Study Indications:       CVA, Speech disturbance, Visual disturbance and Dizziness. Risk Factors:      Hypertension, hyperlipidemia, past history of smoking. Other Factors:     Abrupt stop to HTN medication due to insurance reasons. Comparison Study:  No previous exams Performing Technologist: Rogelia Rohrer  Examination Guidelines: A complete evaluation includes B-mode imaging, spectral Doppler, color Doppler, and power Doppler as needed of all accessible portions of each vessel. Bilateral testing is considered an integral part of a complete  examination. Limited examinations for reoccurring indications may be performed as noted.  Right Carotid Findings: +----------+--------+--------+--------+------------------+------------------+           PSV cm/sEDV cm/sStenosisPlaque DescriptionComments           +----------+--------+--------+--------+------------------+------------------+ CCA Prox  94      16                                intimal thickening +----------+--------+--------+--------+------------------+------------------+ CCA Distal84      19                                intimal thickening +----------+--------+--------+--------+------------------+------------------+ ICA Prox  62      17                                                   +----------+--------+--------+--------+------------------+------------------+  ICA Distal63      21                                                   +----------+--------+--------+--------+------------------+------------------+ ECA       107     23                                                   +----------+--------+--------+--------+------------------+------------------+ +----------+--------+-------+----------------+-------------------+           PSV cm/sEDV cmsDescribe        Arm Pressure (mmHG) +----------+--------+-------+----------------+-------------------+ [id number]      0      Multiphasic, WNL                    +----------+--------+-------+----------------+-------------------+ +---------+--------+--+--------+--+---------+ VertebralPSV cm/s40EDV cm/s10Antegrade +---------+--------+--+--------+--+---------+  Left Carotid Findings: +----------+--------+--------+--------+------------------+------------------+           PSV cm/sEDV cm/sStenosisPlaque DescriptionComments           +----------+--------+--------+--------+------------------+------------------+ CCA Prox  85      14                                intimal thickening  +----------+--------+--------+--------+------------------+------------------+ CCA Distal107     19                                intimal thickening +----------+--------+--------+--------+------------------+------------------+ ICA Prox  77      23                                                   +----------+--------+--------+--------+------------------+------------------+ ICA Distal83      23                                                   +----------+--------+--------+--------+------------------+------------------+ ECA       88      13                                                   +----------+--------+--------+--------+------------------+------------------+ +----------+--------+--------+----------------+-------------------+           PSV cm/sEDV cm/sDescribe        Arm Pressure (mmHG) +----------+--------+--------+----------------+-------------------+ Subclavian178     14      Multiphasic, WNL                    +----------+--------+--------+----------------+-------------------+ +---------+--------+--+--------+-+---------+ VertebralPSV cm/s34EDV cm/s8Antegrade +---------+--------+--+--------+-+---------+   Summary: Right Carotid: The extracranial vessels were near-normal with only minimal wall                thickening or plaque. Left Carotid: The extracranial vessels were near-normal with only minimal wall  thickening or plaque. Vertebrals:  Bilateral vertebral arteries demonstrate antegrade flow. Subclavians: Normal flow hemodynamics were seen in bilateral subclavian              arteries. *See table(s) above for measurements and observations.  Electronically signed by Antony Contras MD on 09/16/2020 at 1:36:20 PM.    Final    VAS [place] LOWER EXTREMITY VENOUS (DVT)  Result Date: 09/16/2020  Lower Venous DVT Study Indications: Stroke.  Risk Factors: DVT HX (per patient). Comparison Study: No previous exams Performing Technologist: Rogelia Rohrer   Examination Guidelines: A complete evaluation includes B-mode imaging, spectral Doppler, color Doppler, and power Doppler as needed of all accessible portions of each vessel. Bilateral testing is considered an integral part of a complete examination. Limited examinations for reoccurring indications may be performed as noted. The reflux portion of the exam is performed with the patient in reverse Trendelenburg.  +---------+---------------+---------+-----------+----------+--------------+ RIGHT    CompressibilityPhasicitySpontaneityPropertiesThrombus Aging +---------+---------------+---------+-----------+----------+--------------+ CFV      Full           Yes      Yes                                 +---------+---------------+---------+-----------+----------+--------------+ SFJ      Full                                                        +---------+---------------+---------+-----------+----------+--------------+ FV Prox  Full           Yes      Yes                                 +---------+---------------+---------+-----------+----------+--------------+ FV Mid   Full           Yes      Yes                                 +---------+---------------+---------+-----------+----------+--------------+ FV DistalFull           Yes      Yes                                 +---------+---------------+---------+-----------+----------+--------------+ PFV      Full                                                        +---------+---------------+---------+-----------+----------+--------------+ POP      Full           Yes      Yes                                 +---------+---------------+---------+-----------+----------+--------------+ PTV      Full                                                        +---------+---------------+---------+-----------+----------+--------------+  PERO     Full                                                         +---------+---------------+---------+-----------+----------+--------------+   +---------+---------------+---------+-----------+----------+--------------+ LEFT     CompressibilityPhasicitySpontaneityPropertiesThrombus Aging +---------+---------------+---------+-----------+----------+--------------+ CFV      Full           Yes      Yes                                 +---------+---------------+---------+-----------+----------+--------------+ SFJ      Full                                                        +---------+---------------+---------+-----------+----------+--------------+ FV Prox  Full           Yes      Yes                                 +---------+---------------+---------+-----------+----------+--------------+ FV Mid   Full           Yes      Yes                                 +---------+---------------+---------+-----------+----------+--------------+ FV DistalFull           Yes      Yes                                 +---------+---------------+---------+-----------+----------+--------------+ PFV      Full                                                        +---------+---------------+---------+-----------+----------+--------------+ POP      Full           Yes      Yes                                 +---------+---------------+---------+-----------+----------+--------------+ PTV      Full                                                        +---------+---------------+---------+-----------+----------+--------------+ PERO     Full                                                        +---------+---------------+---------+-----------+----------+--------------+  Summary: BILATERAL: - No evidence of deep vein thrombosis seen in the lower extremities, bilaterally. - No evidence of superficial venous thrombosis in the lower extremities, bilaterally. -No evidence of popliteal cyst, bilaterally.   *See table(s) above for measurements  and observations. Electronically signed by Harold Barban MD on 09/16/2020 at 10:41:59 PM.    Final    PHYSICAL EXAM Constitutional: Obese middle-aged Caucasian lady not in distress  psych: Affect appropriate to situation Eyes: No scleral injection HENT: No OP obstruction MSK: no joint deformities.  Cardiovascular: Normal rate and regular rhythm.  Respiratory: Effort normal, non-labored breathing GI: Soft.  No distension. There is no tenderness.  Skin: WDI  Neuro: Mental Status: Patient is awake, alert, oriented to person, place, month, year, and situation. Patient is able to give a clear and coherent history. No signs of aphasia or neglect Cranial Nerves: II: Visual Fields are full. Pupils are equal, round, and reactive to light.   III,IV, VI: EOMI without ptosis or diploplia.  V: Facial sensation is symmetric to temperature VII: Facial movement is symmetric.  But has trace right lower facial asymmetry when she smiles. VIII: hearing is intact to voice X: Uvula elevates symmetrically XI: Shoulder shrug is symmetric. XII: tongue is midline without atrophy or fasciculations.  Motor: Tone is normal. Bulk is normal. 5/5 strength was present in all four extremities.  Sensory: Sensation is symmetric to light touch and temperature in the arms and legs. Deep Tendon Reflexes: 2+ and symmetric in the biceps and patellae.  Plantars: Toes are downgoing bilaterally.  Cerebellar: She has slow and deliberate movements on finger-nose-finger bilaterally, but does not pass point  ASSESSMENT/PLAN 55 year old female with a history of hypertension, seizure like episodes in her 21s, heart murmur, migraine headaches, chronic parasthesias bilat hands, depression, RLS, ADD who presented with facial droop that started 3/28.  She also noticed that she was having some trouble with her speaking, saying words that she did not mean to say and "saying gibberish" but that this is improved considerably since  yesterday. Paresthesias could represent central or peripheral pathology.  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 There is no acute intracranial hemorrhage.  MRI brain Diffusion abnormality in the caudate tail on the left most consistent with stroke.  MRA head Truncated examination. Small acute infarct of the left caudate tail. Normal intracranial MRA.  Carotid Doppler Nearly normal bilaterally, no high grade stenosis  2D Echo EF 60-65%, No thrombus, no wall motion abnormality or shunt found.   Bilat LE Doppler no evidence of DVT   LDL 133  HgbA1c 5.7  VTE prophylaxis - Lovenox ppx    Diet   Diet Heart Room service appropriate? Yes; Fluid consistency: Thin   No antiplatelet or anticoagulant medications prior to admission  Recommend DAPT with ASA 72m and Plavix 793mx 3 weeks then ASA 8165monotherapy  TEE ( 1100 tomorrow) and loop placement pending   Therapy recommendations:  Pending  Disposition:  TBD  Hypertension  Stable . Permissive hypertension (OK if < 220/120) but gradually normalize in 5-7 days . Long-term BP goal normotensive  Hyperlipidemia  Home meds: None  LDL 133, not at goal < 70  High intensity statin: Lipitor 20m14mContinue statin at discharge  Bilateral parasthesias/Headaches/Rashes   Stroke diagnosis does not explain all of her reported symptoms   Vasculitic labs:  ESR and CRP WNL, otherwise are pending  Patient chose to attempt MR of brain and C spine with IV ativan x one for sedation today  Other Stroke Risk Factors  Former Cigarette smoker.  Current ETOH use, alcohol level <10, advised to drink no more than 1 drink a day  Obesity, Body mass index is 35.19 kg/m., BMI >/= 30 associated with increased stroke risk, recommend weight loss, diet and exercise as appropriate    Migraines Other Active Problems     Hospital day # 1 Plan check MRI scan of the brain and cervical spine under sedation since patient is having persistent symptoms of paresthesias in all 4 limbs to rule out cervical cord lesion and the previous brain MRI was incomplete.  Follow vasculitic labs.  Long discussion with the patient and her friend at the bedside and answered questions.  Greater than 50% time during this 25-minute visit was spent in counseling and coordination of care about diagnostic testing and answering questions.  Antony Contras MD  To contact Stroke Continuity provider, please refer to [email or web address] After hours, contact General Neurology

## 2020-09-17 NOTE — Progress Notes (Signed)
    [facility] has been requested to perform a transesophageal echocardiogram on Sarah Simpson for stroke.  After careful review of history and examination, the risks and benefits of transesophageal echocardiogram have been explained including risks of esophageal damage, perforation (1:10,000 risk), bleeding, pharyngeal hematoma as well as other potential complications associated with conscious sedation including aspiration, arrhythmia, respiratory failure and death. Alternatives to treatment were discussed, questions were answered. Patient is willing to proceed.   Pt scheduled for TEE 09/18/20 at 11AM. NPO at MN please.  Tami Lin Eliud Polo, [place]  09/17/2020 3:42 PM

## 2020-09-17 NOTE — Progress Notes (Signed)
PROGRESS NOTE    Sarah Simpson  KGM:[id number] DOB: December 12, 1965 DOA: 09/15/2020 PCP: Michael Boston, MD    Brief Narrative:  55 y/o female admitted to the hospital with aphasia and facial droop. She was found to have left basal ganglia infarct. She was admitted for further work up. Neurology following.   Assessment & Plan:   Principal Problem:   Acute CVA (cerebrovascular accident) [facility]) Active Problems:   Essential hypertension   Stroke (cerebrum) (HCC)   Acute CVA, acute large left basal ganglia infarct -noted on MRI head  -etiology unclear -on dual antiplatelet therapy for 3 weeks then aspirin alone -she has undergone carotid dopplers and echo, which were unremarkable -discussed with Dr. Leonie Man -recommendations for TEE and loop recorder -discussed with cardiology and this will be done tomorrow  HLD -continue on high intensity statin  Bilateral paraesthesias/rash -unclear etiology -MRI brain and C spine does not appear to show acute findings explaining her symptoms, although I would defer to neurology -Vasculitis work up has thus far been unremarkable  HTN -holding home dose of losartan to allow permissive hypertension   DVT prophylaxis: enoxaparin (LOVENOX) injection 40 mg Start: 09/16/20 0015  Code Status: full code Family Communication: discussed with patient Disposition Plan: Status is: Inpatient  The patient will require care spanning > 2 midnights and should be moved to inpatient because: Ongoing diagnostic testing needed not appropriate for outpatient work up  Dispo: The patient is from: Home              Anticipated d/c is to: Home              Patient currently is not medically stable to d/c.   Difficult to place patient No         Consultants:   Neurology  Procedures:     Antimicrobials:       Subjective: No new complaints  Objective: Vitals:   09/17/20 0357 09/17/20 0740 09/17/20 1219 09/17/20 1600  BP: 122/75 (!) 145/91  137/77 110/69  Pulse: 61 71 66 72  Resp: 18 17 17 17   Temp: 98.1 F (36.7 C) 98.3 F (36.8 C) 97.6 F (36.4 C) 97.8 F (36.6 C)  TempSrc: Oral Oral  Oral  SpO2: 97% 100% 98% 97%  Weight:      Height:        Intake/Output Summary (Last 24 hours) at 09/17/2020 1836 Last data filed at 09/17/2020 1300 Gross per 24 hour  Intake 1320 ml  Output --  Net 1320 ml   Filed Weights   09/16/20 0931  Weight: 98.9 kg    Examination:  General exam: Alert, awake, oriented x 3 Respiratory system: Clear to auscultation. Respiratory effort normal. Cardiovascular system:RRR. No murmurs, rubs, gallops. Gastrointestinal system: Abdomen is nondistended, soft and nontender. No organomegaly or masses felt. Normal bowel sounds heard. Central nervous system: Alert and oriented. No focal neurological deficits. Extremities: No C/C/E, +pedal pulses Skin: No rashes, lesions or ulcers Psychiatry: Judgement and insight appear normal. Mood & affect appropriate.    Data Reviewed: I have personally reviewed following labs and imaging studies  CBC: Recent Labs  Lab 09/15/20 1628 09/16/20 0211  WBC 6.0 4.9  NEUTROABS 2.3  --   HGB 14.5 13.2  HCT 41.9 38.7  MCV 93.7 93.5  PLT 281 644   Basic Metabolic Panel: Recent Labs  Lab 09/15/20 1628 09/16/20 0211  NA 140  --   K 3.6  --   CL 105  --  CO2 26  --   GLUCOSE 111*  --   BUN 12  --   CREATININE 0.57 0.62  CALCIUM 9.4  --    GFR: Estimated Creatinine Clearance: 93.1 mL/min (by C-G formula based on SCr of 0.62 mg/dL). Liver Function Tests: Recent Labs  Lab 09/15/20 1628  AST 38  ALT 54*  ALKPHOS 43  BILITOT 0.9  PROT 7.3  ALBUMIN 4.2   No results for input(s): LIPASE, AMYLASE in the last 168 hours. No results for input(s): AMMONIA in the last 168 hours. Coagulation Profile: Recent Labs  Lab 09/15/20 1628  INR 1.0   Cardiac Enzymes: No results for input(s): CKTOTAL, CKMB, CKMBINDEX, TROPONINI in the last 168 hours. BNP  (last 3 results) No results for input(s): PROBNP in the last 8760 hours. HbA1C: Recent Labs    09/16/20 0211  HGBA1C 5.7*   CBG: Recent Labs  Lab 09/15/20 1618  GLUCAP 119*   Lipid Profile: Recent Labs    09/16/20 0211  CHOL 191  HDL 43  LDLCALC 133*  TRIG 75  CHOLHDL 4.4   Thyroid Function Tests: No results for input(s): TSH, T4TOTAL, FREET4, T3FREE, THYROIDAB in the last 72 hours. Anemia Panel: No results for input(s): VITAMINB12, FOLATE, FERRITIN, TIBC, IRON, RETICCTPCT in the last 72 hours. Sepsis Labs: No results for input(s): PROCALCITON, LATICACIDVEN in the last 168 hours.  Recent Results (from the past 240 hour(s))  SARS CORONAVIRUS 2 (TAT 6-24 HRS) Nasopharyngeal Nasopharyngeal Swab     Status: None   Collection Time: 09/16/20 12:50 AM   Specimen: Nasopharyngeal Swab  Result Value Ref Range Status   SARS Coronavirus 2 NEGATIVE NEGATIVE Final    Comment: (NOTE) SARS-CoV-2 target nucleic acids are NOT DETECTED.  The SARS-CoV-2 RNA is generally detectable in upper and lower respiratory specimens during the acute phase of infection. Negative results do not preclude SARS-CoV-2 infection, do not rule out co-infections with other pathogens, and should not be used as the sole basis for treatment or other patient management decisions. Negative results must be combined with clinical observations, patient history, and epidemiological information. The expected result is Negative.  Fact Sheet for Patients: SugarRoll.be  Fact Sheet for Healthcare Providers: https://www.woods-mathews.com/  This test is not yet approved or cleared by the [place] FDA and  has been authorized for detection and/or diagnosis of SARS-CoV-2 by FDA under an Emergency Use Authorization (EUA). This EUA will remain  in effect (meaning this test can be used) for the duration of the COVID-19 declaration under Se ction 564(b)(1) of the Act, 21  U.S.C. section 360bbb-3(b)(1), unless the authorization is terminated or revoked sooner.  Performed at [facility], [place] [address], [place], [place] [address]          Radiology Studies: MR ANGIO HEAD WO CONTRAST  Result Date: 09/15/2020 CLINICAL DATA:  Stroke follow-up EXAM: MRI HEAD WITHOUT CONTRAST MRA HEAD WITHOUT CONTRAST TECHNIQUE: Multiplanar, multiecho pulse sequences of the brain and surrounding structures were obtained without intravenous contrast. Angiographic images of the head were obtained using MRA technique without contrast. The patient did not complete the full length examination. Only 6 series are provided for interpretation. COMPARISON:  None. FINDINGS: MRI HEAD FINDINGS Brain: Small acute infarct of the left caudate tail. Normal white matter signal. Normal volume of CSF spaces. No chronic microhemorrhage. Normal midline structures. Vascular: Major flow voids are preserved. Skull and upper cervical spine: Normal calvarium and skull base. Visualized upper cervical spine and soft tissues are normal. Sinuses/Orbits:No  paranasal sinus fluid levels or advanced mucosal thickening. No mastoid or middle ear effusion. Normal orbits. MRA HEAD FINDINGS POSTERIOR CIRCULATION: --Vertebral arteries: Normal --Inferior cerebellar arteries: Normal. --Basilar artery: Normal. --Superior cerebellar arteries: Normal. --Posterior cerebral arteries: Normal. ANTERIOR CIRCULATION: --Intracranial internal carotid arteries: Normal. --Anterior cerebral arteries (ACA): Normal. --Middle cerebral arteries (MCA): Normal. ANATOMIC VARIANTS: None IMPRESSION: 1. Truncated examination. 2. Small acute infarct of the left caudate tail. 3. Normal intracranial MRA. Electronically Signed   By: Ulyses Jarred M.D.   On: 09/15/2020 22:36   MR BRAIN WO CONTRAST  Result Date: 09/17/2020 CLINICAL DATA:  Abnormal vision and speech EXAM: MRI HEAD WITHOUT CONTRAST TECHNIQUE: Multiplanar, multiecho pulse sequences of the  brain and surrounding structures were obtained without intravenous contrast. COMPARISON:  None. FINDINGS: Brain: There is restricted diffusion with corresponding T2 hyperintensity along the roof of the left temporal horn likely involving the caudate tail with superior extension into the adjacent retrolenticular central white matter. No evidence of intracranial hemorrhage. No intracranial mass or mass effect. Ventricles and sulci are within normal limits in size and configuration. Minimal nonspecific additional foci of T2 hyperintensity in the supratentorial white matter is nonspecific and may reflect minor chronic microvascular ischemic changes or gliosis/demyelination of other etiologies. There is no hydrocephalus or extra-axial fluid collection. Vascular: Major vessel flow voids at the skull base are preserved. Skull and upper cervical spine: Normal marrow signal is preserved. Sinuses/Orbits: Minor mucosal thickening.  Orbits are unremarkable. Other: Sella is unremarkable.  Mastoid air cells are clear. IMPRESSION: Small acute infarction involving the central white matter posterior to the internal capsule and lentiform nucleus extending to the caudate tail. Visual symptoms are likely secondary to involvement of optic tract. No hemorrhage. Electronically Signed   By: Macy Mis M.D.   On: 09/17/2020 14:49   MR BRAIN WO CONTRAST  Result Date: 09/15/2020 CLINICAL DATA:  Stroke follow-up EXAM: MRI HEAD WITHOUT CONTRAST MRA HEAD WITHOUT CONTRAST TECHNIQUE: Multiplanar, multiecho pulse sequences of the brain and surrounding structures were obtained without intravenous contrast. Angiographic images of the head were obtained using MRA technique without contrast. The patient did not complete the full length examination. Only 6 series are provided for interpretation. COMPARISON:  None. FINDINGS: MRI HEAD FINDINGS Brain: Small acute infarct of the left caudate tail. Normal white matter signal. Normal volume of CSF  spaces. No chronic microhemorrhage. Normal midline structures. Vascular: Major flow voids are preserved. Skull and upper cervical spine: Normal calvarium and skull base. Visualized upper cervical spine and soft tissues are normal. Sinuses/Orbits:No paranasal sinus fluid levels or advanced mucosal thickening. No mastoid or middle ear effusion. Normal orbits. MRA HEAD FINDINGS POSTERIOR CIRCULATION: --Vertebral arteries: Normal --Inferior cerebellar arteries: Normal. --Basilar artery: Normal. --Superior cerebellar arteries: Normal. --Posterior cerebral arteries: Normal. ANTERIOR CIRCULATION: --Intracranial internal carotid arteries: Normal. --Anterior cerebral arteries (ACA): Normal. --Middle cerebral arteries (MCA): Normal. ANATOMIC VARIANTS: None IMPRESSION: 1. Truncated examination. 2. Small acute infarct of the left caudate tail. 3. Normal intracranial MRA. Electronically Signed   By: Ulyses Jarred M.D.   On: 09/15/2020 22:36   MR CERVICAL SPINE WO CONTRAST  Result Date: 09/17/2020 CLINICAL DATA:  Upper extremity tingling, paresthesias EXAM: MRI CERVICAL SPINE WITHOUT CONTRAST TECHNIQUE: Multiplanar, multisequence MR imaging of the cervical spine was performed. No intravenous contrast was administered. COMPARISON:  None. FINDINGS: Alignment: Straightening with slight reversal of the cervical lordosis. Vertebrae: No fracture, evidence of discitis, or bone lesion. Cord: Normal signal and morphology. Posterior Fossa, vertebral arteries, paraspinal tissues: See concurrently  obtained MRI brain report for evaluation of the posterior fossa. Vertebral artery flow voids intact. Paraspinal soft tissues are within normal limits. Disc levels: C2-C3: No disc protrusion. Mild left facet and uncovertebral arthropathy without foraminal or canal stenosis. C3-C4: Small central disc protrusion with left greater than right facet and uncovertebral arthropathy. Findings result in mild canal stenosis with mild left foraminal  stenosis. C4-C5: Central disc protrusion with mild bilateral facet and uncovertebral arthropathy as well as ligamentum flavum infolding resulting in mild canal stenosis with moderate left and mild right foraminal stenosis. C5-C6: Mild disc osteophyte complex with bilateral uncovertebral arthropathy resulting in mild bilateral foraminal stenosis without canal stenosis. C6-C7: Broad-based disc bulge with left paracentral disc protrusion with slight caudal migration of disc material (series 12, image 11). Mild bilateral uncovertebral arthropathy. Mild bilateral foraminal stenosis without canal stenosis. C7-T1: Negative. IMPRESSION: 1. No acute osseous abnormality or abnormal cord signal. 2. Cervical spondylosis with mild canal stenosis at C3-C4 and C4-C5. 3. Moderate left foraminal stenosis at C4-C5 and mild bilateral foraminal stenosis at C3-4, C5-6, and C6-7. Electronically Signed   By: Davina Poke D.O.   On: 09/17/2020 15:00   ECHOCARDIOGRAM COMPLETE  Result Date: 09/16/2020    ECHOCARDIOGRAM REPORT   Patient Name:   ETERNITY DEXTER Date of Exam: 09/16/2020 Medical Rec #:  [id number]         Height:       66.0 in Accession #:    [id number]        Weight:       194.0 lb Date of Birth:  1966/05/15         BSA:          1.974 m Patient Age:    61 years          BP:           127/71 mmHg Patient Gender: F                 HR:           78 bpm. Exam Location:  Inpatient Procedure: 2D Echo, Cardiac Doppler and Color Doppler Indications:    Stroke I63.9  History:        Patient has no prior history of Echocardiogram examinations.                 Signs/Symptoms:Murmur; Risk Factors:Hypertension.  Sonographer:    Vickie Epley RDCS Referring Phys: Marcus Hook  Sonographer Comments: Image acquisition challenging due to respiratory motion. IMPRESSIONS  1. Left ventricular ejection fraction, by estimation, is 60 to 65%. The left ventricle has normal function. The left ventricle has no regional wall motion  abnormalities. Left ventricular diastolic parameters were normal.  2. Right ventricular systolic function is normal. The right ventricular size is normal.  3. The mitral valve is normal in structure. Trivial mitral valve regurgitation. No evidence of mitral stenosis.  4. The aortic valve was not well visualized. Aortic valve regurgitation is trivial. No aortic stenosis is present.  5. The inferior vena cava is normal in size with greater than 50% respiratory variability, suggesting right atrial pressure of 3 mmHg. FINDINGS  Left Ventricle: Left ventricular ejection fraction, by estimation, is 60 to 65%. The left ventricle has normal function. The left ventricle has no regional wall motion abnormalities. The left ventricular internal cavity size was normal in size. There is  no left ventricular hypertrophy. Left ventricular diastolic parameters were normal. Right Ventricle: The right ventricular size is normal.  No increase in right ventricular wall thickness. Right ventricular systolic function is normal. Left Atrium: Left atrial size was normal in size. Right Atrium: Right atrial size was normal in size. Pericardium: There is no evidence of pericardial effusion. Mitral Valve: The mitral valve is normal in structure. Trivial mitral valve regurgitation. No evidence of mitral valve stenosis. Tricuspid Valve: The tricuspid valve is normal in structure. Tricuspid valve regurgitation is not demonstrated. No evidence of tricuspid stenosis. Aortic Valve: The aortic valve was not well visualized. Aortic valve regurgitation is trivial. No aortic stenosis is present. Pulmonic Valve: The pulmonic valve was normal in structure. Pulmonic valve regurgitation is not visualized. No evidence of pulmonic stenosis. Aorta: The aortic root is normal in size and structure. Venous: The inferior vena cava is normal in size with greater than 50% respiratory variability, suggesting right atrial pressure of 3 mmHg. IAS/Shunts: No atrial level  shunt detected by color flow Doppler.  LEFT VENTRICLE PLAX 2D LVIDd:         4.80 cm      Diastology LVIDs:         3.00 cm      LV e' medial:    8.27 cm/s LV PW:         0.70 cm      LV E/e' medial:  9.3 LV IVS:        0.70 cm      LV e' lateral:   9.68 cm/s LVOT diam:     2.00 cm      LV E/e' lateral: 8.0 LV SV:         93 LV SV Index:   47 LVOT Area:     3.14 cm  LV Volumes (MOD) LV vol d, MOD A2C: 112.0 ml LV vol d, MOD A4C: 104.0 ml LV vol s, MOD A2C: 42.6 ml LV vol s, MOD A4C: 38.8 ml LV SV MOD A2C:     69.4 ml LV SV MOD A4C:     104.0 ml LV SV MOD BP:      67.9 ml RIGHT VENTRICLE RV S prime:     13.40 cm/s TAPSE (M-mode): 1.9 cm LEFT ATRIUM             Index       RIGHT ATRIUM          Index LA diam:        3.60 cm 1.82 cm/m  RA Area:     9.90 cm LA Vol (A2C):   36.6 ml 18.54 ml/m RA Volume:   19.00 ml 9.62 ml/m LA Vol (A4C):   33.0 ml 16.72 ml/m LA Biplane Vol: 36.9 ml 18.69 ml/m  AORTIC VALVE LVOT Vmax:   135.00 cm/s LVOT Vmean:  94.600 cm/s LVOT VTI:    0.295 m  AORTA Ao Root diam: 3.00 cm MITRAL VALVE MV Area (PHT): 3.91 cm    SHUNTS MV Decel Time: 194 msec    Systemic VTI:  0.30 m MV E velocity: 77.10 cm/s  Systemic Diam: 2.00 cm MV A velocity: 66.80 cm/s MV E/A ratio:  1.15 Jenkins Rouge MD Electronically signed by Jenkins Rouge MD Signature Date/Time: 09/16/2020/9:57:54 AM    Final    VAS US CAROTID  Result Date: 09/16/2020 Carotid Arterial Duplex Study Indications:       CVA, Speech disturbance, Visual disturbance and Dizziness. Risk Factors:      Hypertension, hyperlipidemia, past history of smoking. Other Factors:     Abrupt stop to HTN medication  due to insurance reasons. Comparison Study:  No previous exams Performing Technologist: Rogelia Rohrer  Examination Guidelines: A complete evaluation includes B-mode imaging, spectral Doppler, color Doppler, and power Doppler as needed of all accessible portions of each vessel. Bilateral testing is considered an integral part of a complete examination.  Limited examinations for reoccurring indications may be performed as noted.  Right Carotid Findings: +----------+--------+--------+--------+------------------+------------------+           PSV cm/sEDV cm/sStenosisPlaque DescriptionComments           +----------+--------+--------+--------+------------------+------------------+ CCA Prox  94      16                                intimal thickening +----------+--------+--------+--------+------------------+------------------+ CCA Distal84      19                                intimal thickening +----------+--------+--------+--------+------------------+------------------+ ICA Prox  62      17                                                   +----------+--------+--------+--------+------------------+------------------+ ICA Distal63      21                                                   +----------+--------+--------+--------+------------------+------------------+ ECA       107     23                                                   +----------+--------+--------+--------+------------------+------------------+ +----------+--------+-------+----------------+-------------------+           PSV cm/sEDV cmsDescribe        Arm Pressure (mmHG) +----------+--------+-------+----------------+-------------------+ [id number]      0      Multiphasic, WNL                    +----------+--------+-------+----------------+-------------------+ +---------+--------+--+--------+--+---------+ VertebralPSV cm/s40EDV cm/s10Antegrade +---------+--------+--+--------+--+---------+  Left Carotid Findings: +----------+--------+--------+--------+------------------+------------------+           PSV cm/sEDV cm/sStenosisPlaque DescriptionComments           +----------+--------+--------+--------+------------------+------------------+ CCA Prox  85      14                                intimal thickening  +----------+--------+--------+--------+------------------+------------------+ CCA Distal107     19                                intimal thickening +----------+--------+--------+--------+------------------+------------------+ ICA Prox  77      23                                                   +----------+--------+--------+--------+------------------+------------------+  ICA Distal83      23                                                   +----------+--------+--------+--------+------------------+------------------+ ECA       88      13                                                   +----------+--------+--------+--------+------------------+------------------+ +----------+--------+--------+----------------+-------------------+           PSV cm/sEDV cm/sDescribe        Arm Pressure (mmHG) +----------+--------+--------+----------------+-------------------+ Subclavian178     14      Multiphasic, WNL                    +----------+--------+--------+----------------+-------------------+ +---------+--------+--+--------+-+---------+ VertebralPSV cm/s34EDV cm/s8Antegrade +---------+--------+--+--------+-+---------+   Summary: Right Carotid: The extracranial vessels were near-normal with only minimal wall                thickening or plaque. Left Carotid: The extracranial vessels were near-normal with only minimal wall               thickening or plaque. Vertebrals:  Bilateral vertebral arteries demonstrate antegrade flow. Subclavians: Normal flow hemodynamics were seen in bilateral subclavian              arteries. *See table(s) above for measurements and observations.  Electronically signed by Antony Contras MD on 09/16/2020 at 1:36:20 PM.    Final    VAS [place] LOWER EXTREMITY VENOUS (DVT)  Result Date: 09/16/2020  Lower Venous DVT Study Indications: Stroke.  Risk Factors: DVT HX (per patient). Comparison Study: No previous exams Performing Technologist: Rogelia Rohrer   Examination Guidelines: A complete evaluation includes B-mode imaging, spectral Doppler, color Doppler, and power Doppler as needed of all accessible portions of each vessel. Bilateral testing is considered an integral part of a complete examination. Limited examinations for reoccurring indications may be performed as noted. The reflux portion of the exam is performed with the patient in reverse Trendelenburg.  +---------+---------------+---------+-----------+----------+--------------+ RIGHT    CompressibilityPhasicitySpontaneityPropertiesThrombus Aging +---------+---------------+---------+-----------+----------+--------------+ CFV      Full           Yes      Yes                                 +---------+---------------+---------+-----------+----------+--------------+ SFJ      Full                                                        +---------+---------------+---------+-----------+----------+--------------+ FV Prox  Full           Yes      Yes                                 +---------+---------------+---------+-----------+----------+--------------+ FV Mid   Full           Yes  Yes                                 +---------+---------------+---------+-----------+----------+--------------+ FV DistalFull           Yes      Yes                                 +---------+---------------+---------+-----------+----------+--------------+ PFV      Full                                                        +---------+---------------+---------+-----------+----------+--------------+ POP      Full           Yes      Yes                                 +---------+---------------+---------+-----------+----------+--------------+ PTV      Full                                                        +---------+---------------+---------+-----------+----------+--------------+ PERO     Full                                                         +---------+---------------+---------+-----------+----------+--------------+   +---------+---------------+---------+-----------+----------+--------------+ LEFT     CompressibilityPhasicitySpontaneityPropertiesThrombus Aging +---------+---------------+---------+-----------+----------+--------------+ CFV      Full           Yes      Yes                                 +---------+---------------+---------+-----------+----------+--------------+ SFJ      Full                                                        +---------+---------------+---------+-----------+----------+--------------+ FV Prox  Full           Yes      Yes                                 +---------+---------------+---------+-----------+----------+--------------+ FV Mid   Full           Yes      Yes                                 +---------+---------------+---------+-----------+----------+--------------+ FV DistalFull           Yes      Yes                                 +---------+---------------+---------+-----------+----------+--------------+  PFV      Full                                                        +---------+---------------+---------+-----------+----------+--------------+ POP      Full           Yes      Yes                                 +---------+---------------+---------+-----------+----------+--------------+ PTV      Full                                                        +---------+---------------+---------+-----------+----------+--------------+ PERO     Full                                                        +---------+---------------+---------+-----------+----------+--------------+     Summary: BILATERAL: - No evidence of deep vein thrombosis seen in the lower extremities, bilaterally. - No evidence of superficial venous thrombosis in the lower extremities, bilaterally. -No evidence of popliteal cyst, bilaterally.   *See table(s) above for measurements  and observations. Electronically signed by Harold Barban MD on 09/16/2020 at 10:41:59 PM.    Final         Scheduled Meds: . aspirin EC  81 mg Oral Daily  . atorvastatin  80 mg Oral q1800  . clopidogrel  75 mg Oral Daily  . enoxaparin (LOVENOX) injection  40 mg Subcutaneous QHS  . gabapentin  100 mg Oral TID  . sodium chloride flush  3 mL Intravenous Once  . valACYclovir  500 mg Oral Daily   Continuous Infusions:    LOS: 1 day    Time spent: 37mins    Kathie Dike, MD [facility]   If 7PM-7AM, please contact night-coverage [email or web address]  09/17/2020, 6:36 PM

## 2020-09-18 ENCOUNTER — Other Ambulatory Visit: Payer: Self-pay | Admitting: Internal Medicine

## 2020-09-18 ENCOUNTER — Encounter (HOSPITAL_COMMUNITY): Payer: Self-pay | Admitting: Internal Medicine

## 2020-09-18 ENCOUNTER — Inpatient Hospital Stay (HOSPITAL_COMMUNITY): Payer: Self-pay | Admitting: Certified Registered Nurse Anesthetist

## 2020-09-18 ENCOUNTER — Encounter (HOSPITAL_COMMUNITY)
Admission: EM | Disposition: A | Discharge status: Home / Self Care | Payer: Self-pay | Source: Home / Self Care | Attending: Internal Medicine

## 2020-09-18 ENCOUNTER — Inpatient Hospital Stay (HOSPITAL_COMMUNITY): Payer: Self-pay

## 2020-09-18 DIAGNOSIS — I639 Cerebral infarction, unspecified: Secondary | ICD-10-CM

## 2020-09-18 HISTORY — PX: BUBBLE STUDY: SHX6837

## 2020-09-18 HISTORY — PX: TEE WITHOUT CARDIOVERSION: SHX5443

## 2020-09-18 LAB — LUPUS ANTICOAGULANT PANEL
DRVVT: 28.2 s (ref 0.0–47.0)
PTT Lupus Anticoagulant: 28.4 s (ref 0.0–51.9)

## 2020-09-18 LAB — MPO/PR-3 (ANCA) ANTIBODIES
ANCA Proteinase 3: <3.5 U/mL (ref 0.0–3.5)
Myeloperoxidase Abs: <9 U/mL (ref 0.0–9.0)

## 2020-09-18 SURGERY — ECHOCARDIOGRAM, TRANSESOPHAGEAL
Anesthesia: Monitor Anesthesia Care

## 2020-09-18 MED ORDER — PROPOFOL 10 MG/ML IV BOLUS
INTRAVENOUS | Status: DC | PRN
  Administered 2020-09-18: 25 mg via INTRAVENOUS
  Administered 2020-09-18: 30 mg via INTRAVENOUS
  Administered 2020-09-18: 25 mg via INTRAVENOUS

## 2020-09-18 MED ORDER — PROPOFOL 500 MG/50ML IV EMUL
INTRAVENOUS | Status: DC | PRN
  Administered 2020-09-18: 100 ug/kg/min via INTRAVENOUS

## 2020-09-18 MED ORDER — GABAPENTIN 100 MG PO CAPS: 100.0000 mg | ORAL_CAPSULE | Freq: Three times a day (TID) | ORAL | 0 refills | Status: DC

## 2020-09-18 MED ORDER — LIDOCAINE 2% (20 MG/ML) 5 ML SYRINGE
INTRAMUSCULAR | Status: DC | PRN
  Administered 2020-09-18: 60 mg via INTRAVENOUS

## 2020-09-18 MED ORDER — ATORVASTATIN CALCIUM 80 MG PO TABS: 80.0000 mg | ORAL_TABLET | Freq: Every day | ORAL | 0 refills | Status: DC

## 2020-09-18 MED ORDER — LIDOCAINE VISCOUS HCL 2 % MT SOLN
OROMUCOSAL | Status: AC
  Filled 2020-09-18: qty 15

## 2020-09-18 MED ORDER — LOSARTAN POTASSIUM 25 MG PO TABS: 25.0000 mg | ORAL_TABLET | Freq: Every day | ORAL | 0 refills | Status: DC

## 2020-09-18 MED ORDER — LIDOCAINE VISCOUS HCL 2 % MT SOLN
OROMUCOSAL | Status: DC | PRN
  Administered 2020-09-18: 15 mL via OROMUCOSAL

## 2020-09-18 MED ORDER — SODIUM CHLORIDE 0.9 % IV SOLN: INTRAVENOUS | Status: DC

## 2020-09-18 MED ORDER — CLOPIDOGREL BISULFATE 75 MG PO TABS: 75.0000 mg | ORAL_TABLET | Freq: Every day | ORAL | 1 refills | Status: DC

## 2020-09-18 MED ORDER — ESCITALOPRAM OXALATE 10 MG PO TABS: 10.0000 mg | ORAL_TABLET | Freq: Every day | ORAL | 0 refills | Status: DC

## 2020-09-18 MED ORDER — ASPIRIN 81 MG PO TBEC: 81.0000 mg | DELAYED_RELEASE_TABLET | Freq: Every day | ORAL | 11 refills | Status: DC

## 2020-09-18 MED FILL — CLOPIDOGREL 75 MG TABLET: 75 | 30 days supply | Qty: 30 | Fill #0

## 2020-09-18 MED FILL — ATORVASTATIN CALCIUM 80 MG: 80 | 30 days supply | Qty: 30 | Fill #0

## 2020-09-18 MED FILL — GABAPENTIN 100 MG CAPSULE: 100 | 30 days supply | Qty: 90 | Fill #0

## 2020-09-18 MED FILL — ESCITALOPRAM 10 MG TABLET: 10 | 30 days supply | Qty: 30 | Fill #0

## 2020-09-18 MED FILL — ASPIRIN LOW DOSE 81 MG TBEC: 81 | 30 days supply | Qty: 30 | Fill #0

## 2020-09-18 MED FILL — LOSARTAN POTASSIUM 25 MG TA: 25 | 30 days supply | Qty: 30 | Fill #0

## 2020-09-18 NOTE — Progress Notes (Signed)
Pt safely discharged. Taxi voucher and all medications given to Pt. AVS provided using teach-back method.  VS wnL for patient and as per flow. IVs removed, discharge instructions provided and all questions and concerns addressed. Pt verbalized understanding.

## 2020-09-18 NOTE — Discharge Summary (Signed)
Physician Discharge Summary  Sarah Simpson KYH:[id number] DOB: [birth year] DOA: 09/15/2020  PCP: Michael Boston, MD  Admit date: 09/15/2020 Discharge date: 09/18/2020  Admitted From: home Disposition:  home  Recommendations for Outpatient Follow-up:  1. Follow up with PCP has been scheduled 2. Follow up with neurology in 6 weeks  Home Health: Equipment/Devices:  Discharge Condition:stable CODE STATUS:full code Diet recommendation: heart healthy  Brief/Interim Summary: 55 y/o female admitted to the hospital with aphasia and facial droop. She was found to have left basal ganglia infarct. She was admitted for further work up. Neurology following.  Discharge Diagnoses:  Principal Problem:   Acute CVA (cerebrovascular accident) [facility]) Active Problems:   Essential hypertension   Stroke (cerebrum) (HCC)  Acute CVA, acute large left basal ganglia infarct -noted on MRI head  -etiology unclear -on dual antiplatelet therapy for 3 weeks then aspirin alone -she has undergone carotid dopplers and echo, which were unremarkable -discussed with Dr. Leonie Man -TEE was done on 4/1 that did not show significant findings -patient was taking hormonal therapy for postmenopausal hot flashes, which may have contributed to her cva.  This has been discontinued -follow up with neuro in 6 weeks  HLD -continue on high intensity statin  Bilateral paraesthesias/rash -unclear etiology -MRI brain and C spine does not appear to show acute findings explaining her symptoms -Vasculitis work up has thus far been unremarkable  HTN -resume losartan on discharge  Discharge Instructions  Discharge Instructions    Ambulatory referral to Neurology   Complete by: As directed    An appointment is requested in approximately: 6 weeks   Diet - low sodium heart healthy   Complete by: As directed    Increase activity slowly   Complete by: As directed      Allergies as of 09/18/2020      Reactions    Ciprofloxacin Swelling   Levaquin [levofloxacin] Anaphylaxis   Hives and throat swelling    Amoxil [amoxicillin] Rash   Extreme itching limbs and trunk    Dynacin [minocycline] Rash   Sulfamethazine Rash      Medication List    STOP taking these medications   Fyavolv 1-5 MG-MCG Tabs tablet Generic drug: norethindrone-ethinyl estradiol   ondansetron 4 MG tablet Commonly known as: ZOFRAN     TAKE these medications   aspirin 81 MG EC tablet Take 1 tablet (81 mg total) by mouth daily. Swallow whole. Start taking on: September 19, 2020   atorvastatin 80 MG tablet Commonly known as: LIPITOR Take 1 tablet (80 mg total) by mouth daily at 6 PM.   clopidogrel 75 MG tablet Commonly known as: PLAVIX Take 1 tablet (75 mg total) by mouth daily. Start taking on: September 19, 2020   diphenhydrAMINE 25 mg capsule Commonly known as: BENADRYL Take 25 mg by mouth every 6 (six) hours as needed.   escitalopram 10 MG tablet Commonly known as: LEXAPRO Take 1 tablet (10 mg total) by mouth daily.   gabapentin 100 MG capsule Commonly known as: NEURONTIN Take 1 capsule (100 mg total) by mouth 3 (three) times daily.   losartan 25 MG tablet Commonly known as: COZAAR Take 1 tablet (25 mg total) by mouth daily.   melatonin 3 MG Tabs tablet Take 3 mg by mouth at bedtime as needed (sleep).   multivitamin tablet Take 1 tablet by mouth daily.   valACYclovir 500 MG tablet Commonly known as: VALTREX Take 500 mg by mouth daily.       Follow-up Information  [place]. Schedule an appointment as soon as possible for a visit.   Why: Please use this location for your pharmacy needs Contact information: [place] [address] [phone]       [place] Follow up on 10/06/2020.   Specialty: Family Medicine Why: Your appointment is at 2:30 pm. Please arrive early and bring a picutre ID and your current  medications.  Contact information: [place] [address] [phone]             Allergies  Allergen Reactions  . Ciprofloxacin Swelling  . Levaquin [Levofloxacin] Anaphylaxis    Hives and throat swelling   . Amoxil [Amoxicillin] Rash    Extreme itching limbs and trunk   . Dynacin [Minocycline] Rash  . Sulfamethazine Rash    Consultations:  Neurology  Cardiology   Procedures/Studies: CT HEAD WO CONTRAST  Result Date: 09/15/2020 CLINICAL DATA:  Neuro deficit, acute, stroke suspected; dizziness, nonspecific. Additional history provided: Patient reports tingling, dizziness, bumping into things, feeling lightheaded and blurry vision. EXAM: CT HEAD WITHOUT CONTRAST TECHNIQUE: Contiguous axial images were obtained from the base of the skull through the vertex without intravenous contrast. COMPARISON:  No pertinent prior exams available for comparison. FINDINGS: Brain: Cerebral volume is normal for age. A small age-indeterminate lacunar infarct is questioned within the left pons (series 3, image 10) (series 6, image 31) (series 5, images 36 and 37). There is no acute intracranial hemorrhage. No demarcated cortical infarct. No extra-axial fluid collection. No evidence of intracranial mass. No midline shift. Vascular: No hyperdense vessel. Skull: Normal. Negative for fracture or focal lesion. Sinuses/Orbits: Visualized orbits show no acute finding. Small mucous retention cyst within the right ethmoid air cells. IMPRESSION: A small age-indeterminate lacunar infarct is questioned within the left pons. Consider a brain MRI for further evaluation. No evidence of acute intracranial hemorrhage or acute cortical infarction. Small right ethmoid sinus mucous retention cyst. Electronically Signed   By: Kellie Simmering DO   On: 09/15/2020 16:55   MR ANGIO HEAD WO CONTRAST  Result Date: 09/15/2020 CLINICAL DATA:  Stroke follow-up EXAM: MRI HEAD WITHOUT CONTRAST MRA  HEAD WITHOUT CONTRAST TECHNIQUE: Multiplanar, multiecho pulse sequences of the brain and surrounding structures were obtained without intravenous contrast. Angiographic images of the head were obtained using MRA technique without contrast. The patient did not complete the full length examination. Only 6 series are provided for interpretation. COMPARISON:  None. FINDINGS: MRI HEAD FINDINGS Brain: Small acute infarct of the left caudate tail. Normal white matter signal. Normal volume of CSF spaces. No chronic microhemorrhage. Normal midline structures. Vascular: Major flow voids are preserved. Skull and upper cervical spine: Normal calvarium and skull base. Visualized upper cervical spine and soft tissues are normal. Sinuses/Orbits:No paranasal sinus fluid levels or advanced mucosal thickening. No mastoid or middle ear effusion. Normal orbits. MRA HEAD FINDINGS POSTERIOR CIRCULATION: --Vertebral arteries: Normal --Inferior cerebellar arteries: Normal. --Basilar artery: Normal. --Superior cerebellar arteries: Normal. --Posterior cerebral arteries: Normal. ANTERIOR CIRCULATION: --Intracranial internal carotid arteries: Normal. --Anterior cerebral arteries (ACA): Normal. --Middle cerebral arteries (MCA): Normal. ANATOMIC VARIANTS: None IMPRESSION: 1. Truncated examination. 2. Small acute infarct of the left caudate tail. 3. Normal intracranial MRA. Electronically Signed   By: Ulyses Jarred M.D.   On: 09/15/2020 22:36   MR BRAIN WO CONTRAST  Result Date: 09/17/2020 CLINICAL DATA:  Abnormal vision and speech EXAM: MRI HEAD WITHOUT CONTRAST TECHNIQUE: Multiplanar, multiecho pulse sequences  of the brain and surrounding structures were obtained without intravenous contrast. COMPARISON:  None. FINDINGS: Brain: There is restricted diffusion with corresponding T2 hyperintensity along the roof of the left temporal horn likely involving the caudate tail with superior extension into the adjacent retrolenticular central white  matter. No evidence of intracranial hemorrhage. No intracranial mass or mass effect. Ventricles and sulci are within normal limits in size and configuration. Minimal nonspecific additional foci of T2 hyperintensity in the supratentorial white matter is nonspecific and may reflect minor chronic microvascular ischemic changes or gliosis/demyelination of other etiologies. There is no hydrocephalus or extra-axial fluid collection. Vascular: Major vessel flow voids at the skull base are preserved. Skull and upper cervical spine: Normal marrow signal is preserved. Sinuses/Orbits: Minor mucosal thickening.  Orbits are unremarkable. Other: Sella is unremarkable.  Mastoid air cells are clear. IMPRESSION: Small acute infarction involving the central white matter posterior to the internal capsule and lentiform nucleus extending to the caudate tail. Visual symptoms are likely secondary to involvement of optic tract. No hemorrhage. Electronically Signed   By: Macy Mis M.D.   On: 09/17/2020 14:49   MR BRAIN WO CONTRAST  Result Date: 09/15/2020 CLINICAL DATA:  Stroke follow-up EXAM: MRI HEAD WITHOUT CONTRAST MRA HEAD WITHOUT CONTRAST TECHNIQUE: Multiplanar, multiecho pulse sequences of the brain and surrounding structures were obtained without intravenous contrast. Angiographic images of the head were obtained using MRA technique without contrast. The patient did not complete the full length examination. Only 6 series are provided for interpretation. COMPARISON:  None. FINDINGS: MRI HEAD FINDINGS Brain: Small acute infarct of the left caudate tail. Normal white matter signal. Normal volume of CSF spaces. No chronic microhemorrhage. Normal midline structures. Vascular: Major flow voids are preserved. Skull and upper cervical spine: Normal calvarium and skull base. Visualized upper cervical spine and soft tissues are normal. Sinuses/Orbits:No paranasal sinus fluid levels or advanced mucosal thickening. No mastoid or middle  ear effusion. Normal orbits. MRA HEAD FINDINGS POSTERIOR CIRCULATION: --Vertebral arteries: Normal --Inferior cerebellar arteries: Normal. --Basilar artery: Normal. --Superior cerebellar arteries: Normal. --Posterior cerebral arteries: Normal. ANTERIOR CIRCULATION: --Intracranial internal carotid arteries: Normal. --Anterior cerebral arteries (ACA): Normal. --Middle cerebral arteries (MCA): Normal. ANATOMIC VARIANTS: None IMPRESSION: 1. Truncated examination. 2. Small acute infarct of the left caudate tail. 3. Normal intracranial MRA. Electronically Signed   By: Ulyses Jarred M.D.   On: 09/15/2020 22:36   MR CERVICAL SPINE WO CONTRAST  Result Date: 09/17/2020 CLINICAL DATA:  Upper extremity tingling, paresthesias EXAM: MRI CERVICAL SPINE WITHOUT CONTRAST TECHNIQUE: Multiplanar, multisequence MR imaging of the cervical spine was performed. No intravenous contrast was administered. COMPARISON:  None. FINDINGS: Alignment: Straightening with slight reversal of the cervical lordosis. Vertebrae: No fracture, evidence of discitis, or bone lesion. Cord: Normal signal and morphology. Posterior Fossa, vertebral arteries, paraspinal tissues: See concurrently obtained MRI brain report for evaluation of the posterior fossa. Vertebral artery flow voids intact. Paraspinal soft tissues are within normal limits. Disc levels: C2-C3: No disc protrusion. Mild left facet and uncovertebral arthropathy without foraminal or canal stenosis. C3-C4: Small central disc protrusion with left greater than right facet and uncovertebral arthropathy. Findings result in mild canal stenosis with mild left foraminal stenosis. C4-C5: Central disc protrusion with mild bilateral facet and uncovertebral arthropathy as well as ligamentum flavum infolding resulting in mild canal stenosis with moderate left and mild right foraminal stenosis. C5-C6: Mild disc osteophyte complex with bilateral uncovertebral arthropathy resulting in mild bilateral foraminal  stenosis without canal stenosis. C6-C7: Broad-based disc bulge  with left paracentral disc protrusion with slight caudal migration of disc material (series 12, image 11). Mild bilateral uncovertebral arthropathy. Mild bilateral foraminal stenosis without canal stenosis. C7-T1: Negative. IMPRESSION: 1. No acute osseous abnormality or abnormal cord signal. 2. Cervical spondylosis with mild canal stenosis at C3-C4 and C4-C5. 3. Moderate left foraminal stenosis at C4-C5 and mild bilateral foraminal stenosis at C3-4, C5-6, and C6-7. Electronically Signed   By: Davina Poke D.O.   On: 09/17/2020 15:00   ECHOCARDIOGRAM COMPLETE  Result Date: 09/16/2020    ECHOCARDIOGRAM REPORT   Patient Name:   Sarah Simpson Date of Exam: 09/16/2020 Medical Rec #:  [id number]         Height:       66.0 in Accession #:    [id number]        Weight:       194.0 lb Date of Birth:  07-19-1965         BSA:          1.974 m Patient Age:    61 years          BP:           127/71 mmHg Patient Gender: F                 HR:           78 bpm. Exam Location:  Inpatient Procedure: 2D Echo, Cardiac Doppler and Color Doppler Indications:    Stroke I63.9  History:        Patient has no prior history of Echocardiogram examinations.                 Signs/Symptoms:Murmur; Risk Factors:Hypertension.  Sonographer:    Vickie Epley RDCS Referring Phys: [place]  Sonographer Comments: Image acquisition challenging due to respiratory motion. IMPRESSIONS  1. Left ventricular ejection fraction, by estimation, is 60 to 65%. The left ventricle has normal function. The left ventricle has no regional wall motion abnormalities. Left ventricular diastolic parameters were normal.  2. Right ventricular systolic function is normal. The right ventricular size is normal.  3. The mitral valve is normal in structure. Trivial mitral valve regurgitation. No evidence of mitral stenosis.  4. The aortic valve was not well visualized. Aortic valve regurgitation  is trivial. No aortic stenosis is present.  5. The inferior vena cava is normal in size with greater than 50% respiratory variability, suggesting right atrial pressure of 3 mmHg. FINDINGS  Left Ventricle: Left ventricular ejection fraction, by estimation, is 60 to 65%. The left ventricle has normal function. The left ventricle has no regional wall motion abnormalities. The left ventricular internal cavity size was normal in size. There is  no left ventricular hypertrophy. Left ventricular diastolic parameters were normal. Right Ventricle: The right ventricular size is normal. No increase in right ventricular wall thickness. Right ventricular systolic function is normal. Left Atrium: Left atrial size was normal in size. Right Atrium: Right atrial size was normal in size. Pericardium: There is no evidence of pericardial effusion. Mitral Valve: The mitral valve is normal in structure. Trivial mitral valve regurgitation. No evidence of mitral valve stenosis. Tricuspid Valve: The tricuspid valve is normal in structure. Tricuspid valve regurgitation is not demonstrated. No evidence of tricuspid stenosis. Aortic Valve: The aortic valve was not well visualized. Aortic valve regurgitation is trivial. No aortic stenosis is present. Pulmonic Valve: The pulmonic valve was normal in structure. Pulmonic valve regurgitation is not visualized. No evidence of pulmonic stenosis.  Aorta: The aortic root is normal in size and structure. Venous: The inferior vena cava is normal in size with greater than 50% respiratory variability, suggesting right atrial pressure of 3 mmHg. IAS/Shunts: No atrial level shunt detected by color flow Doppler.  LEFT VENTRICLE PLAX 2D LVIDd:         4.80 cm      Diastology LVIDs:         3.00 cm      LV e' medial:    8.27 cm/s LV PW:         0.70 cm      LV E/e' medial:  9.3 LV IVS:        0.70 cm      LV e' lateral:   9.68 cm/s LVOT diam:     2.00 cm      LV E/e' lateral: 8.0 LV SV:         93 LV SV Index:    47 LVOT Area:     3.14 cm  LV Volumes (MOD) LV vol d, MOD A2C: 112.0 ml LV vol d, MOD A4C: 104.0 ml LV vol s, MOD A2C: 42.6 ml LV vol s, MOD A4C: 38.8 ml LV SV MOD A2C:     69.4 ml LV SV MOD A4C:     104.0 ml LV SV MOD BP:      67.9 ml RIGHT VENTRICLE RV S prime:     13.40 cm/s TAPSE (M-mode): 1.9 cm LEFT ATRIUM             Index       RIGHT ATRIUM          Index LA diam:        3.60 cm 1.82 cm/m  RA Area:     9.90 cm LA Vol (A2C):   36.6 ml 18.54 ml/m RA Volume:   19.00 ml 9.62 ml/m LA Vol (A4C):   33.0 ml 16.72 ml/m LA Biplane Vol: 36.9 ml 18.69 ml/m  AORTIC VALVE LVOT Vmax:   135.00 cm/s LVOT Vmean:  94.600 cm/s LVOT VTI:    0.295 m  AORTA Ao Root diam: 3.00 cm MITRAL VALVE MV Area (PHT): 3.91 cm    SHUNTS MV Decel Time: 194 msec    Systemic VTI:  0.30 m MV E velocity: 77.10 cm/s  Systemic Diam: 2.00 cm MV A velocity: 66.80 cm/s MV E/A ratio:  1.15 Jenkins Rouge MD Electronically signed by Jenkins Rouge MD Signature Date/Time: 09/16/2020/9:57:54 AM    Final    ECHO TEE  Result Date: 09/18/2020    TRANSESOPHOGEAL ECHO REPORT   Patient Name:   Sarah Simpson Date of Exam: 09/18/2020 Medical Rec #:  [id number]         Height:       66.0 in Accession #:    [id number]        Weight:       218.0 lb Date of Birth:  [birth year]         BSA:          2.075 m Patient Age:    58 years          BP:           92/58 mmHg Patient Gender: F                 HR:           81 bpm. Exam Location:  Inpatient Procedure: Transesophageal Echo and Color Doppler Indications:  CVA  History:         Patient has prior history of Echocardiogram examinations, most                  recent 09/16/2020.  Sonographer:     Philipp Deputy Referring Phys:  [id number] Tami Lin [facility] Diagnosing Phys: Dorris Carnes MD PROCEDURE: After discussion of the risks and benefits of a TEE, an informed consent was obtained from the patient. The transesophogeal probe was passed without difficulty through the esophogus of the patient. Imaged were obtained  with the patient in a left lateral decubitus position. Local oropharyngeal anesthetic was provided with Cetacaine. Sedation performed by different physician. The patient was monitored while under deep sedation. Anesthestetic sedation was provided intravenously by Anesthesiology: 80mg  of Propofol. Image quality was adequate. The patient developed no complications during the procedure. IMPRESSIONS  1. Left ventricular ejection fraction, by estimation, is 60 to 65%. The left ventricle has normal function.  2. Right ventricular systolic function is normal. The right ventricular size is normal.  3. No left atrial/left atrial appendage thrombus was detected.  4. The mitral valve is normal in structure. Trivial mitral valve regurgitation.  5. The aortic valve is normal in structure. Aortic valve regurgitation is not visualized.  6. Agitated saline contrast bubble study was negative, with no evidence of any interatrial shunt. FINDINGS  Left Ventricle: Left ventricular ejection fraction, by estimation, is 60 to 65%. The left ventricle has normal function. The left ventricular internal cavity size was normal in size. Right Ventricle: The right ventricular size is normal. Right vetricular wall thickness was not assessed. Right ventricular systolic function is normal. Left Atrium: Left atrial size was normal in size. No left atrial/left atrial appendage thrombus was detected. Right Atrium: Right atrial size was normal in size. Pericardium: There is no evidence of pericardial effusion. Mitral Valve: The mitral valve is normal in structure. Trivial mitral valve regurgitation. Tricuspid Valve: The tricuspid valve is normal in structure. Tricuspid valve regurgitation is trivial. Aortic Valve: The aortic valve is normal in structure. Aortic valve regurgitation is not visualized. Pulmonic Valve: The pulmonic valve was normal in structure. Pulmonic valve regurgitation is mild. Aorta: The aortic root is normal in size and structure.  There is minimal (Grade I) plaque. IAS/Shunts: No atrial level shunt detected by color flow Doppler. Agitated saline contrast was given intravenously to evaluate for intracardiac shunting. Agitated saline contrast bubble study was negative, with no evidence of any interatrial shunt. Dorris Carnes MD Electronically signed by Dorris Carnes MD Signature Date/Time: 09/18/2020/3:37:29 PM    Final    VAS US CAROTID  Result Date: 09/16/2020 Carotid Arterial Duplex Study Indications:       CVA, Speech disturbance, Visual disturbance and Dizziness. Risk Factors:      Hypertension, hyperlipidemia, past history of smoking. Other Factors:     Abrupt stop to HTN medication due to insurance reasons. Comparison Study:  No previous exams Performing Technologist: Rogelia Rohrer  Examination Guidelines: A complete evaluation includes B-mode imaging, spectral Doppler, color Doppler, and power Doppler as needed of all accessible portions of each vessel. Bilateral testing is considered an integral part of a complete examination. Limited examinations for reoccurring indications may be performed as noted.  Right Carotid Findings: +----------+--------+--------+--------+------------------+------------------+           PSV cm/sEDV cm/sStenosisPlaque DescriptionComments           +----------+--------+--------+--------+------------------+------------------+ CCA Prox  94      16  intimal thickening +----------+--------+--------+--------+------------------+------------------+ CCA Distal84      19                                intimal thickening +----------+--------+--------+--------+------------------+------------------+ ICA Prox  62      17                                                   +----------+--------+--------+--------+------------------+------------------+ ICA Distal63      21                                                    +----------+--------+--------+--------+------------------+------------------+ ECA       107     23                                                   +----------+--------+--------+--------+------------------+------------------+ +----------+--------+-------+----------------+-------------------+           PSV cm/sEDV cmsDescribe        Arm Pressure (mmHG) +----------+--------+-------+----------------+-------------------+ [id number]      0      Multiphasic, WNL                    +----------+--------+-------+----------------+-------------------+ +---------+--------+--+--------+--+---------+ VertebralPSV cm/s40EDV cm/s10Antegrade +---------+--------+--+--------+--+---------+  Left Carotid Findings: +----------+--------+--------+--------+------------------+------------------+           PSV cm/sEDV cm/sStenosisPlaque DescriptionComments           +----------+--------+--------+--------+------------------+------------------+ CCA Prox  85      14                                intimal thickening +----------+--------+--------+--------+------------------+------------------+ CCA Distal107     19                                intimal thickening +----------+--------+--------+--------+------------------+------------------+ ICA Prox  77      23                                                   +----------+--------+--------+--------+------------------+------------------+ ICA Distal83      23                                                   +----------+--------+--------+--------+------------------+------------------+ ECA       88      13                                                   +----------+--------+--------+--------+------------------+------------------+ +----------+--------+--------+----------------+-------------------+  PSV cm/sEDV cm/sDescribe        Arm Pressure (mmHG) +----------+--------+--------+----------------+-------------------+  Subclavian178     14      Multiphasic, WNL                    +----------+--------+--------+----------------+-------------------+ +---------+--------+--+--------+-+---------+ VertebralPSV cm/s34EDV cm/s8Antegrade +---------+--------+--+--------+-+---------+   Summary: Right Carotid: The extracranial vessels were near-normal with only minimal wall                thickening or plaque. Left Carotid: The extracranial vessels were near-normal with only minimal wall               thickening or plaque. Vertebrals:  Bilateral vertebral arteries demonstrate antegrade flow. Subclavians: Normal flow hemodynamics were seen in bilateral subclavian              arteries. *See table(s) above for measurements and observations.  Electronically signed by Antony Contras MD on 09/16/2020 at 1:36:20 PM.    Final    VAS [place] LOWER EXTREMITY VENOUS (DVT)  Result Date: 09/16/2020  Lower Venous DVT Study Indications: Stroke.  Risk Factors: DVT HX (per patient). Comparison Study: No previous exams Performing Technologist: Rogelia Rohrer  Examination Guidelines: A complete evaluation includes B-mode imaging, spectral Doppler, color Doppler, and power Doppler as needed of all accessible portions of each vessel. Bilateral testing is considered an integral part of a complete examination. Limited examinations for reoccurring indications may be performed as noted. The reflux portion of the exam is performed with the patient in reverse Trendelenburg.  +---------+---------------+---------+-----------+----------+--------------+ RIGHT    CompressibilityPhasicitySpontaneityPropertiesThrombus Aging +---------+---------------+---------+-----------+----------+--------------+ CFV      Full           Yes      Yes                                 +---------+---------------+---------+-----------+----------+--------------+ SFJ      Full                                                         +---------+---------------+---------+-----------+----------+--------------+ FV Prox  Full           Yes      Yes                                 +---------+---------------+---------+-----------+----------+--------------+ FV Mid   Full           Yes      Yes                                 +---------+---------------+---------+-----------+----------+--------------+ FV DistalFull           Yes      Yes                                 +---------+---------------+---------+-----------+----------+--------------+ PFV      Full                                                        +---------+---------------+---------+-----------+----------+--------------+  POP      Full           Yes      Yes                                 +---------+---------------+---------+-----------+----------+--------------+ PTV      Full                                                        +---------+---------------+---------+-----------+----------+--------------+ PERO     Full                                                        +---------+---------------+---------+-----------+----------+--------------+   +---------+---------------+---------+-----------+----------+--------------+ LEFT     CompressibilityPhasicitySpontaneityPropertiesThrombus Aging +---------+---------------+---------+-----------+----------+--------------+ CFV      Full           Yes      Yes                                 +---------+---------------+---------+-----------+----------+--------------+ SFJ      Full                                                        +---------+---------------+---------+-----------+----------+--------------+ FV Prox  Full           Yes      Yes                                 +---------+---------------+---------+-----------+----------+--------------+ FV Mid   Full           Yes      Yes                                  +---------+---------------+---------+-----------+----------+--------------+ FV DistalFull           Yes      Yes                                 +---------+---------------+---------+-----------+----------+--------------+ PFV      Full                                                        +---------+---------------+---------+-----------+----------+--------------+ POP      Full           Yes      Yes                                 +---------+---------------+---------+-----------+----------+--------------+ PTV  Full                                                        +---------+---------------+---------+-----------+----------+--------------+ PERO     Full                                                        +---------+---------------+---------+-----------+----------+--------------+     Summary: BILATERAL: - No evidence of deep vein thrombosis seen in the lower extremities, bilaterally. - No evidence of superficial venous thrombosis in the lower extremities, bilaterally. -No evidence of popliteal cyst, bilaterally.   *See table(s) above for measurements and observations. Electronically signed by Harold Barban MD on 09/16/2020 at 10:41:59 PM.    Final        Subjective:   Discharge Exam: Vitals:   09/18/20 1121 09/18/20 1130 09/18/20 1140 09/18/20 1543  BP: (!) 92/58 118/73 116/73 112/74  Pulse: 81 76 71 72  Resp: 10 18 14 18   Temp: 98.1 F (36.7 C)   (!) 97.3 F (36.3 C)  TempSrc: Temporal   Oral  SpO2: 93% 98% 98%   Weight:      Height:        General: Pt is alert, awake, not in acute distress Cardiovascular: RRR, S1/S2 +, no rubs, no gallops Respiratory: CTA bilaterally, no wheezing, no rhonchi Abdominal: Soft, NT, ND, bowel sounds + Extremities: no edema, no cyanosis    The results of significant diagnostics from this hospitalization (including imaging, microbiology, ancillary and laboratory) are listed below for reference.      Microbiology: Recent Results (from the past 240 hour(s))  SARS CORONAVIRUS 2 (TAT 6-24 HRS) Nasopharyngeal Nasopharyngeal Swab     Status: None   Collection Time: 09/16/20 12:50 AM   Specimen: Nasopharyngeal Swab  Result Value Ref Range Status   SARS Coronavirus 2 NEGATIVE NEGATIVE Final    Comment: (NOTE) SARS-CoV-2 target nucleic acids are NOT DETECTED.  The SARS-CoV-2 RNA is generally detectable in upper and lower respiratory specimens during the acute phase of infection. Negative results do not preclude SARS-CoV-2 infection, do not rule out co-infections with other pathogens, and should not be used as the sole basis for treatment or other patient management decisions. Negative results must be combined with clinical observations, patient history, and epidemiological information. The expected result is Negative.  Fact Sheet for Patients: SugarRoll.be  Fact Sheet for Healthcare Providers: https://www.woods-mathews.com/  This test is not yet approved or cleared by the [place] FDA and  has been authorized for detection and/or diagnosis of SARS-CoV-2 by FDA under an Emergency Use Authorization (EUA). This EUA will remain  in effect (meaning this test can be used) for the duration of the COVID-19 declaration under Se ction 564(b)(1) of the Act, 21 U.S.C. section 360bbb-3(b)(1), unless the authorization is terminated or revoked sooner.  Performed at [facility], [place] [address] [place], [place] [address]      Labs: BNP (last 3 results) No results for input(s): BNP in the last 8760 hours. Basic Metabolic Panel: Recent Labs  Lab 09/15/20 1628 09/16/20 0211  NA 140  --   K 3.6  --   CL 105  --  CO2 26  --   GLUCOSE 111*  --   BUN 12  --   CREATININE 0.57 0.62  CALCIUM 9.4  --    Liver Function Tests: Recent Labs  Lab 09/15/20 1628  AST 38  ALT 54*  ALKPHOS 43  BILITOT 0.9  PROT 7.3  ALBUMIN 4.2   No  results for input(s): LIPASE, AMYLASE in the last 168 hours. No results for input(s): AMMONIA in the last 168 hours. CBC: Recent Labs  Lab 09/15/20 1628 09/16/20 0211  WBC 6.0 4.9  NEUTROABS 2.3  --   HGB 14.5 13.2  HCT 41.9 38.7  MCV 93.7 93.5  PLT 281 241   Cardiac Enzymes: No results for input(s): CKTOTAL, CKMB, CKMBINDEX, TROPONINI in the last 168 hours. BNP: Invalid input(s): POCBNP CBG: Recent Labs  Lab 09/15/20 1618  GLUCAP 119*   D-Dimer No results for input(s): DDIMER in the last 72 hours. Hgb A1c Recent Labs    09/16/20 0211  HGBA1C 5.7*   Lipid Profile Recent Labs    09/16/20 0211  CHOL 191  HDL 43  LDLCALC 133*  TRIG 75  CHOLHDL 4.4   Thyroid function studies No results for input(s): TSH, T4TOTAL, T3FREE, THYROIDAB in the last 72 hours.  Invalid input(s): FREET3 Anemia work up No results for input(s): VITAMINB12, FOLATE, FERRITIN, TIBC, IRON, RETICCTPCT in the last 72 hours. Urinalysis    Component Value Date/Time   COLORURINE AMBER (A) 09/15/2020 1848   APPEARANCEUR HAZY (A) 09/15/2020 1848   LABSPEC 1.027 09/15/2020 1848   PHURINE 5.0 09/15/2020 1848   GLUCOSEU NEGATIVE 09/15/2020 1848   HGBUR NEGATIVE 09/15/2020 1848   BILIRUBINUR NEGATIVE 09/15/2020 1848   KETONESUR 5 (A) 09/15/2020 1848   PROTEINUR NEGATIVE 09/15/2020 1848   NITRITE NEGATIVE 09/15/2020 1848   LEUKOCYTESUR MODERATE (A) 09/15/2020 1848   Sepsis Labs Invalid input(s): PROCALCITONIN,  WBC,  LACTICIDVEN Microbiology Recent Results (from the past 240 hour(s))  SARS CORONAVIRUS 2 (TAT 6-24 HRS) Nasopharyngeal Nasopharyngeal Swab     Status: None   Collection Time: 09/16/20 12:50 AM   Specimen: Nasopharyngeal Swab  Result Value Ref Range Status   SARS Coronavirus 2 NEGATIVE NEGATIVE Final    Comment: (NOTE) SARS-CoV-2 target nucleic acids are NOT DETECTED.  The SARS-CoV-2 RNA is generally detectable in upper and lower respiratory specimens during the acute phase of  infection. Negative results do not preclude SARS-CoV-2 infection, do not rule out co-infections with other pathogens, and should not be used as the sole basis for treatment or other patient management decisions. Negative results must be combined with clinical observations, patient history, and epidemiological information. The expected result is Negative.  Fact Sheet for Patients: SugarRoll.be  Fact Sheet for Healthcare Providers: https://www.woods-mathews.com/  This test is not yet approved or cleared by the [place] FDA and  has been authorized for detection and/or diagnosis of SARS-CoV-2 by FDA under an Emergency Use Authorization (EUA). This EUA will remain  in effect (meaning this test can be used) for the duration of the COVID-19 declaration under Se ction 564(b)(1) of the Act, 21 U.S.C. section 360bbb-3(b)(1), unless the authorization is terminated or revoked sooner.  Performed at [facility], [place] [address] [place], [place] [address]      Time coordinating discharge: 61mins  SIGNED:   Kathie Dike, MD  [facility] 09/18/2020, 4:26 PM   If 7PM-7AM, please contact night-coverage [email or web address]

## 2020-09-18 NOTE — Progress Notes (Signed)
STROKE TEAM PROGRESS NOTE      INTERVAL HISTORY VSS. Afebrile.  Patient scheduled for TEE later today.  Her neurological exam is stable.  She has no new complaints.  Her sister is at the bedside. Vitals:   09/18/20 1020 09/18/20 1121 09/18/20 1130 09/18/20 1140  BP: 134/78 (!) 92/58 118/73 116/73  Pulse: 72 81 76 71  Resp: $Remo'13 10 18 14  'ynsGb$ Temp: 98.6 F (37 C) 98.1 F (36.7 C)    TempSrc: Oral Temporal    SpO2: 97% 93% 98% 98%  Weight:      Height:       CBC:  Recent Labs  Lab 09/15/20 1628 09/16/20 0211  WBC 6.0 4.9  NEUTROABS 2.3  --   HGB 14.5 13.2  HCT 41.9 38.7  MCV 93.7 93.5  PLT 281 350   Basic Metabolic Panel:  Recent Labs  Lab 09/15/20 1628 09/16/20 0211  NA 140  --   K 3.6  --   CL 105  --   CO2 26  --   GLUCOSE 111*  --   BUN 12  --   CREATININE 0.57 0.62  CALCIUM 9.4  --    Lipid Panel:  Recent Labs  Lab 09/16/20 0211  CHOL 191  TRIG 75  HDL 43  CHOLHDL 4.4  VLDL 15  LDLCALC 133*   HgbA1c:  Recent Labs  Lab 09/16/20 0211  HGBA1C 5.7*   Urine Drug Screen:  Recent Labs  Lab 09/15/20 1848  LABOPIA NONE DETECTED  COCAINSCRNUR NONE DETECTED  LABBENZ NONE DETECTED  AMPHETMU NONE DETECTED  THCU NONE DETECTED  LABBARB NONE DETECTED    Alcohol Level  Recent Labs  Lab 09/15/20 1848  ETH <10    IMAGING past 24 hours No results found. PHYSICAL EXAM Constitutional: Obese middle-aged  lady not in distress  psych: Affect appropriate to situation Eyes: No scleral injection HENT: No OP obstruction MSK: no joint deformities.  Cardiovascular: Normal rate and regular rhythm.  Respiratory: Effort normal, non-labored breathing GI: Soft.  No distension. There is no tenderness.  Skin: WDI  Neuro: Mental Status: Patient is awake, alert, oriented to person, place, month, year, and situation. Patient is able to give a clear and coherent history. No signs of aphasia or neglect Cranial Nerves: II: Visual Fields are full. Pupils are equal,  round, and reactive to light.   III,IV, VI: EOMI without ptosis or diploplia.  V: Facial sensation is symmetric to temperature VII: Facial movement is symmetric.  But has trace right lower facial asymmetry when she smiles. VIII: hearing is intact to voice X: Uvula elevates symmetrically XI: Shoulder shrug is symmetric. XII: tongue is midline without atrophy or fasciculations.  Motor: Tone is normal. Bulk is normal. 5/5 strength was present in all four extremities.  Sensory: Sensation is symmetric to light touch and temperature in the arms and legs. Deep Tendon Reflexes: 2+ and symmetric in the biceps and patellae.  Plantars: Toes are downgoing bilaterally.  Cerebellar: She has slow and deliberate movements on finger-nose-finger bilaterally, but does not pass point  ASSESSMENT/PLAN 55 year old female with a history of hypertension, seizure like episodes in her 48s, heart murmur, migraine headaches, chronic parasthesias bilat hands, depression, RLS, ADD who presented with facial droop that started 3/28.  She also noticed that she was having some trouble with her speaking, saying words that she did not mean to say and "saying gibberish" but that this is improved considerably since yesterday. Paresthesias could represent central or peripheral  pathology.  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 There is no acute intracranial hemorrhage.  MRI brain Diffusion abnormality in the caudate tail on the left most consistent with stroke.  MRA head Truncated examination. Small acute infarct of the left caudate tail. Normal intracranial MRA.  Carotid Doppler Nearly normal bilaterally, no high grade stenosis  2D Echo EF 60-65%, No thrombus, no wall motion abnormality or shunt found.   Bilat LE Doppler  no evidence of DVT   LDL 133  HgbA1c 5.7  VTE prophylaxis - Lovenox ppx    Diet   Diet Heart Room service appropriate? Yes; Fluid consistency: Thin   No antiplatelet or anticoagulant medications prior to admission  Recommend DAPT with ASA $Remove'81mg'sfKpFGv$  and Plavix $RemoveBe'75mg'NTvGXJpHX$  x 3 weeks then ASA $Remove'81mg'FeQlZyj$  monotherapy  TEE   and loop placement pending   Therapy recommendations:  Pending  Disposition:  TBD  Hypertension  Stable . Permissive hypertension (OK if < 220/120) but gradually normalize in 5-7 days . Long-term BP goal normotensive  Hyperlipidemia  Home meds: None  LDL 133, not at goal < 70  High intensity statin: Lipitor $RemoveBeforeD'80mg'dsvitVTGuymXwe$    Continue statin at discharge  Bilateral parasthesias/Headaches/Rashes   Stroke diagnosis does not explain all of her reported symptoms   Vasculitic labs: ESR and CRP WNL, otherwise are pending  Patient chose to attempt MR of brain and C spine with IV ativan x one for sedation today  Other Stroke Risk Factors  Former Cigarette smoker.  Current ETOH use, alcohol level <10, advised to drink no more than 1 drink a day  Obesity, Body mass index is 35.19 kg/m., BMI >/= 30 associated with increased stroke risk, recommend weight loss, diet and exercise as appropriate   Migraines Other Active Problems     Hospital day # 2 Plan  MRI scan of the brain and cervical spine under sedation no additional lesions of concern.  No evidence of multiple sclerosis no significant spinal stenosis or cord abnormality.  Follow vasculitic labs.  Long discussion with the patient and  and answered questions.  Greater than 50% time during this 25-minute visit was spent in counseling and coordination of care about diagnostic testing and answering questions.  Recommend loop recorder.  Discharge home after that.  Follow-up as an outpatient stroke clinic in 6 weeks.  Stroke team will sign off.  Antony Contras MD  To contact Stroke Continuity provider, please refer to  [email or web address] After hours, contact General Neurology

## 2020-09-18 NOTE — TOC Transition Note (Signed)
Transition of Care [facility]) - CM/SW Discharge Note   Patient Details  Name: [place] MRN: [id number] Date of Birth: 1966-04-28  [facility] CM/SW Contact:  Pollie Friar, RN Phone Number: 09/18/2020, 4:34 PM   Clinical Narrative:    Patient discharging home with self care. No f/u per PT/OT.  Pt without a PCP or insurance. CM obtained an appt at [facility] with information on the AVS.  No DME needs.  Medications for home covered with MATCH and delivered to the room per [place].  Pt needing transport home. CM will provide cab voucher.   Final next level of care: Home/Self Care Barriers to Discharge: Barriers Unresolved (comment),Inadequate or no insurance   Patient Goals and CMS Choice        Discharge Placement                       Discharge Plan and Services   Discharge Planning Services: CM Consult                                 Social Determinants of Health (SDOH) Interventions     Readmission Risk Interventions No flowsheet data found.

## 2020-09-18 NOTE — Progress Notes (Signed)
SLP Cancellation Note  Patient Details Name: [place] MRN: [id number] DOB: 07/27/1965   Cancelled treatment:        Attempted further diagnostic treatment for speech-language-cognitive abilities. Off unit for TEE.    Houston Siren 09/18/2020, 10:45 AM   Orbie Pyo Colvin Caroli.Ed Risk analyst [phone] Office [phone]

## 2020-09-18 NOTE — Anesthesia Preprocedure Evaluation (Signed)
Anesthesia Evaluation  Patient identified by MRN, date of birth, ID band Patient awake    Reviewed: Allergy & Precautions, NPO status , Patient's Chart, lab work & pertinent test results  Airway Mallampati: II  TM Distance: >3 FB Neck ROM: Full    Dental  (+) Teeth Intact, Dental Advisory Given   Pulmonary former smoker,    breath sounds clear to auscultation       Cardiovascular hypertension,  Rhythm:Regular Rate:Normal     Neuro/Psych    GI/Hepatic   Endo/Other    Renal/GU      Musculoskeletal   Abdominal (+) + obese,   Peds  Hematology   Anesthesia Other Findings   Reproductive/Obstetrics                             Anesthesia Physical Anesthesia Plan  ASA: III  Anesthesia Plan: MAC   Post-op Pain Management:    Induction: Intravenous  PONV Risk Score and Plan: Propofol infusion  Airway Management Planned: Simple Face Mask and Nasal Cannula  Additional Equipment:   Intra-op Plan:   Post-operative Plan:   Informed Consent: I have reviewed the patients History and Physical, chart, labs and discussed the procedure including the risks, benefits and alternatives for the proposed anesthesia with the patient or authorized representative who has indicated his/her understanding and acceptance.     Dental advisory given  Plan Discussed with: Anesthesiologist and CRNA  Anesthesia Plan Comments:         Anesthesia Quick Evaluation

## 2020-09-18 NOTE — Interval H&P Note (Signed)
History and Physical Interval Note:  09/18/2020 10:37 AM  [place]  has presented today for surgery, with the diagnosis of STROKE.  The various methods of treatment have been discussed with the patient and family. After consideration of risks, benefits and other options for treatment, the patient has consented to  Procedure(s): TRANSESOPHAGEAL ECHOCARDIOGRAM (TEE) (N/A) as a surgical intervention.  The patient's history has been reviewed, patient examined, no change in status, stable for surgery.  I have reviewed the patient's chart and labs.  Questions were answered to the patient's satisfaction.     Dorris Carnes

## 2020-09-18 NOTE — Plan of Care (Signed)

## 2020-09-18 NOTE — Transfer of Care (Signed)
Immediate Anesthesia Transfer of Care Note  Patient: Marguerette Sheller  Procedure(s) Performed: TRANSESOPHAGEAL ECHOCARDIOGRAM (TEE) (N/A ) BUBBLE STUDY  Patient Location: Endoscopy Unit  Anesthesia Type:MAC  Level of Consciousness: drowsy  Airway & Oxygen Therapy: Patient Spontanous Breathing and Patient connected to nasal cannula oxygen  Post-op Assessment: Report given to RN and Post -op Vital signs reviewed and stable  Post vital signs: Reviewed and stable  Last Vitals:  Vitals Value Taken Time  BP 92/58 09/18/20 1121  Temp    Pulse 78 09/18/20 1123  Resp 17 09/18/20 1123  SpO2 94 % 09/18/20 1123  Vitals shown include unvalidated device data.  Last Pain:  Vitals:   09/18/20 1020  TempSrc: Oral  PainSc:          Complications: No complications documented.

## 2020-09-18 NOTE — Anesthesia Procedure Notes (Signed)
Procedure Name: MAC Date/Time: 09/18/2020 11:05 AM Performed by: Colin Benton, CRNA Pre-anesthesia Checklist: Patient identified, Emergency Drugs available, Suction available and Patient being monitored Patient Re-evaluated:Patient Re-evaluated prior to induction Oxygen Delivery Method: Nasal cannula Induction Type: IV induction Airway Equipment and Method: Bite block Placement Confirmation: positive ETCO2 Dental Injury: Teeth and Oropharynx as per pre-operative assessment

## 2020-09-18 NOTE — CV Procedure (Signed)
Transesophageal Echo  Pt sedated by anesthesia with Propofol intravenously  Throat numbed with viscous lidocaine Bite guard placed TEE probe advanced to mid esophagus without difficulty   LA, LAA without masses No PFO by color doppler or with injection of agitated saline   TV normal AV normal  No AI PV normal Mild PI MV normal  Trace MR  LVEF and RVEF normal  Minimal plaquing of thoracic aorta   Procedure without complications  Full report to follow   Dorris Carnes MD

## 2020-09-20 ENCOUNTER — Encounter (HOSPITAL_COMMUNITY): Payer: Self-pay | Admitting: Internal Medicine

## 2020-09-22 NOTE — Anesthesia Postprocedure Evaluation (Signed)
Anesthesia Post Note  Patient: [place]  Procedure(s) Performed: TRANSESOPHAGEAL ECHOCARDIOGRAM (TEE) (N/A ) BUBBLE STUDY     Patient location during evaluation: Endoscopy Anesthesia Type: MAC Level of consciousness: awake and alert Pain management: pain level controlled Vital Signs Assessment: post-procedure vital signs reviewed and stable Respiratory status: spontaneous breathing, nonlabored ventilation, respiratory function stable and patient connected to nasal cannula oxygen Cardiovascular status: stable and blood pressure returned to baseline Postop Assessment: no apparent nausea or vomiting Anesthetic complications: no   No complications documented.  Last Vitals:  Vitals:   09/18/20 1140 09/18/20 1543  BP: 116/73 112/74  Pulse: 71 72  Resp: 14 18  Temp:  (!) 36.3 C  SpO2: 98%     Last Pain:  Vitals:   09/18/20 1543  TempSrc: Oral  PainSc:                  [name]

## 2020-09-25 ENCOUNTER — Other Ambulatory Visit: Payer: Self-pay | Admitting: *Deleted

## 2020-09-25 NOTE — Patient Outreach (Signed)
[facility]) Care Management  09/25/2020  Redell Bhandari [birth year] [id number]  [facility] Unsuccessful outreach for EMMI-stroke  RED ON EMMI ALERT Day #           Date:  Red Alert Reason:   Insurance: pending medicaid [facility] admissions x 1  ED visits x 1 in the last 6 months  [place] discharge    Outreach attempt to the home number  No answer. [facility] RN CM left HIPAA [facility] Portability and Accountability Act) compliant voicemail message along with CM's contact info.   Plan: [facility] RN CM scheduled this patient for another call attempt within 4-7 business days unsuccessful outreach letter sent  Joelene Millin L. Lavina Hamman, RN, BSN, [place] Coordinator Office number [phone] Mobile number [phone]  Main [facility] number [phone] Fax number [phone]

## 2020-10-02 ENCOUNTER — Other Ambulatory Visit: Payer: Self-pay | Admitting: *Deleted

## 2020-10-02 ENCOUNTER — Other Ambulatory Visit: Payer: Self-pay

## 2020-10-02 DIAGNOSIS — Z780 Asymptomatic menopausal state: Secondary | ICD-10-CM | POA: Insufficient documentation

## 2020-10-02 DIAGNOSIS — F331 Major depressive disorder, recurrent, moderate: Secondary | ICD-10-CM | POA: Insufficient documentation

## 2020-10-02 DIAGNOSIS — A6000 Herpesviral infection of urogenital system, unspecified: Secondary | ICD-10-CM | POA: Insufficient documentation

## 2020-10-02 DIAGNOSIS — E669 Obesity, unspecified: Secondary | ICD-10-CM | POA: Insufficient documentation

## 2020-10-02 DIAGNOSIS — G4452 New daily persistent headache (NDPH): Secondary | ICD-10-CM | POA: Insufficient documentation

## 2020-10-02 DIAGNOSIS — R7303 Prediabetes: Secondary | ICD-10-CM | POA: Insufficient documentation

## 2020-10-02 DIAGNOSIS — Z800 Family history of malignant neoplasm of digestive organs: Secondary | ICD-10-CM | POA: Insufficient documentation

## 2020-10-02 NOTE — Patient Outreach (Addendum)
[facility]) Care Management  10/02/2020  Sarah Simpson [birth year] [id number]  [facility] successful outreach for EMMI-stroke not on APL-discharge from [place] RED ON [place] Day #       3    Date: Tuesday 09/22/20 1301  Red Alert Reason: Questions/problems with meds? Yes  Insurance: pending medicaid [facility] admissions x 1  ED visits x 1 in the last 6 months  [place] discharge    Initial assessment  EMMI Questions/problems with meds? Yes Pt reports this was a correct answer as she is having visual issues She reports she was not able read at all until a few days ago Her family (sister) and friend assisted with reading for her  Reviewed her discharge sheet and medication list with her  She has all her medications and is taking as prescribed  Residual cerebrovascular accident (CVA) symptoms  "Close vision worse" She reports having tingling in hands and feet even before her CVA and continue with symptoms She is on Neurontin This was noted during neurology hospital evaluation Possibly not related to CVA  Has been having daily headache  Headaches eventually go a way per patient She reports a hx of migraine headaches and today confirms the daily headaches since discharge are not her normal migraine headache  They are more manageable   To see she reports she has to hold things literally in front of her face  She has been wearing contacts since 8th grade but now wearing her glasses  Encouraged to rest her eyes- Discussed eye straining and nerves of eyes She likes to read    Coverage and primary care provider (PCP) services She reports she had insurance for the last 3 months in 2021  She reports she has only been seen twice by her primary care provider (PCP)  Diabetes   She was informed she has pre diabetes Not on insulin or oral medications for diabetes now Her HgA1c is 5.7 during hospitalization was 5.6 prior to admission   Diet/weight loss Aware  over weight and need lose BMI 35.19  Lots of diet questions Answered questions about EF 60-65% 2 D ECHO status and her LDL (was 133, not at goal of <70) related to her stroke   appointments  Has appt with pcp and neurologist in the future Sees pcp on 10/06/20 and neurology on 10/22/20  Has access to mychart   Answered her questions about her Acute subcortical infarct in the region of the caudate tail on the left due to large subcortical infarct likely of cryptogenic etiology in detail    Will send EMMI education via e-mail for audible information at this time on diabetes type 2, stroke secondary prevention, weight management/loss diet, migraine/headaches-full program/tracking, and heart disease-lifestyle changes Verified e- mail address correct in EPIC    Past Medical History:  Diagnosis Date  . ADD (attention deficit disorder)   . Allergy   . Anemia   . Arthritis    OA back   . Cyst of right ovary   . Depression   . Heart murmur   . History of kidney stones   . Hypertension   . Neuromuscular disorder (HCC)    RLS  . Restless leg syndrome   . Seizures ([place])    in her 77's- none since-? etiology    Patient Active Problem List   Diagnosis Date Noted  . Family history of malignant neoplasm of gastrointestinal tract 10/02/2020  . Genital herpes simplex 10/02/2020  . Menopause 10/02/2020  .  Moderate recurrent major depression ([place]) 10/02/2020  . New daily persistent headache 10/02/2020  . Obesity 10/02/2020  . Prediabetes 10/02/2020  . Stroke (cerebrum) ([place]) 09/16/2020  . Acute CVA (cerebrovascular accident) ([place]) 09/15/2020  . Benign essential hypertension 09/15/2020  Former Cigarette smoker   Plans [facility] RN CM will outreach to patient within the next 14-21 business days as discussed  Pt encouraged to return a call to [place] CM prn    Trejan Buda L. Lavina Hamman, RN, BSN, [place] Coordinator Office number [phone] Main [facility] number  [phone] Fax number [phone]

## 2020-10-05 ENCOUNTER — Other Ambulatory Visit: Payer: Self-pay | Admitting: *Deleted

## 2020-10-05 NOTE — Patient Outreach (Signed)
[facility]) Care Management  10/05/2020  Sarah Simpson 06-Oct-1965 [id number]   [facility] EMMI-stroke referral not on apl-discharge from [place] RED ON [place]  Day# 13 Date Friday October 02 2020 [place] Reason:  Had follow-up appointment? No  And  Day # 3Date: Tuesday 09/22/20 1301  Red Alert Reason: Questions/problems with meds? Yes  Insurance:pending medicaid [facility] admissions x1ED visits x 1in the last 6 months [place] discharge    10/05/20 received red alert from [place] Had follow-up appointment? No  [facility] RN CM outreached successfully to patient on 10/02/20 after this 10 am EMMI outreach automated call  This red alert was addressed during the 10/02/20 [facility] RN CM outreach  She has appt with pcp and neurology in the future Pt to see pcp on 10/06/20 and neurology on 10/22/20   Plans [facility] RN CM will follow up with patient as discussed within 14-21 business days  Lorie Cleckley L. Lavina Hamman, RN, BSN, [place] Coordinator Office number [phone] Main [facility] number [phone] Fax number [phone]

## 2020-10-06 ENCOUNTER — Other Ambulatory Visit: Payer: Self-pay

## 2020-10-06 ENCOUNTER — Encounter (INDEPENDENT_AMBULATORY_CARE_PROVIDER_SITE_OTHER): Payer: Self-pay | Admitting: Primary Care

## 2020-10-06 ENCOUNTER — Ambulatory Visit (INDEPENDENT_AMBULATORY_CARE_PROVIDER_SITE_OTHER): Payer: Self-pay | Admitting: Primary Care

## 2020-10-06 VITALS — BP 120/79 | HR 69 | Temp 97.3°F | Ht 66.0 in | Wt 218.0 lb

## 2020-10-06 DIAGNOSIS — Z09 Encounter for follow-up examination after completed treatment for conditions other than malignant neoplasm: Secondary | ICD-10-CM

## 2020-10-06 DIAGNOSIS — I639 Cerebral infarction, unspecified: Secondary | ICD-10-CM

## 2020-10-06 DIAGNOSIS — I10 Essential (primary) hypertension: Secondary | ICD-10-CM

## 2020-10-06 DIAGNOSIS — Z7689 Persons encountering health services in other specified circumstances: Secondary | ICD-10-CM

## 2020-10-06 DIAGNOSIS — F331 Major depressive disorder, recurrent, moderate: Secondary | ICD-10-CM

## 2020-10-06 MED ORDER — ESCITALOPRAM OXALATE 10 MG PO TABS
1.0000 | ORAL_TABLET | Freq: Every day | ORAL | 1 refills | Status: DC
Start: 2020-10-06 — End: 2020-10-29
  Filled 2020-10-06: qty 30, 30d supply, fill #0
  Filled 2020-10-19: qty 90, 90d supply, fill #0

## 2020-10-06 MED ORDER — LOSARTAN POTASSIUM 25 MG PO TABS
25.0000 mg | ORAL_TABLET | Freq: Every day | ORAL | 1 refills | Status: DC
  Filled 2020-10-06 – 2020-10-19 (×2): qty 30, 30d supply, fill #0

## 2020-10-06 MED ORDER — ATORVASTATIN CALCIUM 80 MG PO TABS
80.0000 mg | ORAL_TABLET | Freq: Every day | ORAL | 1 refills | Status: DC
  Filled 2020-10-06 – 2020-10-19 (×2): qty 30, 30d supply, fill #0

## 2020-10-06 NOTE — Progress Notes (Signed)
Chief complaint Hospital discharge    HPI Ms.  Sarah Simpson "Sarah Simpson" is a  55 y.o. obese female presents for follow up from the hospital. Admit date to the hospital was 09/15/20, patient was discharged from the hospital on 09/18/20, patient was admitted for: She was brought hospital/emergency room because patient's friend felt like she was talking but made no sense and became worried. History of hypertension unable to afford medications .She had some facial drooping and  felt dizzy'.MRI brain showed left caudate tail acute stroke.  MR angiogram of the head did not show any obstruction.  EKG shows normal sinus rhythm.  Labs are largely unremarkable.  Covid test is pending.  Neurologist on-call was consulted patient admitted for further stroke work-up. She is also, establishing  Care.  Sleepy she has not seen Dr. [place] Breed  Past Medical History:  Diagnosis Date  . ADD (attention deficit disorder)   . Allergy   . Anemia   . Arthritis    OA back   . Cyst of right ovary   . Depression   . Heart murmur   . History of kidney stones   . Hypertension   . Neuromuscular disorder (HCC)    RLS  . Restless leg syndrome   . Seizures ([place])    in her 20's- none since-? etiology      Allergies  Allergen Reactions  . Ciprofloxacin Swelling  . Levaquin [Levofloxacin] Anaphylaxis    Hives and throat swelling   . Amoxil [Amoxicillin] Rash    Extreme itching limbs and trunk   . Dynacin [Minocycline] Rash  . Sulfamethazine Rash      Current Outpatient Medications on File Prior to Visit  Medication Sig Dispense Refill  . aspirin 81 MG EC tablet TAKE 1 TABLET (81 MG TOTAL) BY MOUTH DAILY. SWALLOW WHOLE. (Patient taking differently: Take by mouth 5 (five) times daily. SWALLOW WHOLE.) 30 tablet 11  . aspirin EC 81 MG EC tablet Take 1 tablet (81 mg total) by mouth daily. Swallow whole. 30 tablet 11  . atorvastatin (LIPITOR) 80 MG tablet Take 1 tablet (80 mg total) by mouth daily at 6 PM. 30 tablet 0  .  atorvastatin (LIPITOR) 80 MG tablet TAKE 1 TABLET (80 MG TOTAL) BY MOUTH DAILY AT 6 PM. (Patient taking differently: Take 1 tablet by mouth daily. at 6pm) 30 tablet 0  . clopidogrel (PLAVIX) 75 MG tablet Take 1 tablet (75 mg total) by mouth daily. 30 tablet 1  . clopidogrel (PLAVIX) 75 MG tablet TAKE 1 TABLET (75 MG TOTAL) BY MOUTH DAILY. 30 tablet 1  . diphenhydrAMINE (BENADRYL) 25 mg capsule Take 25 mg by mouth every 6 (six) hours as needed.    Marland Kitchen escitalopram (LEXAPRO) 10 MG tablet Take 1 tablet (10 mg total) by mouth daily. 30 tablet 0  . escitalopram (LEXAPRO) 10 MG tablet TAKE 1 TABLET (10 MG TOTAL) BY MOUTH DAILY. 30 tablet 0  . gabapentin (NEURONTIN) 100 MG capsule Take 1 capsule (100 mg total) by mouth 3 (three) times daily. 90 capsule 0  . gabapentin (NEURONTIN) 100 MG capsule TAKE 1 CAPSULE (100 MG TOTAL) BY MOUTH THREE TIMES DAILY. 90 capsule 0  . losartan (COZAAR) 25 MG tablet Take 1 tablet (25 mg total) by mouth daily. 30 tablet 0  . losartan (COZAAR) 25 MG tablet TAKE 1 TABLET (25 MG TOTAL) BY MOUTH DAILY. 30 tablet 0  . Melatonin 3 MG TABS Take 3 mg by mouth at bedtime as needed (sleep).    Marland Kitchen  Multiple Vitamin (MULTIVITAMIN) tablet Take 1 tablet by mouth daily.    . valACYclovir (VALTREX) 500 MG tablet Take 500 mg by mouth daily.  4   No current facility-administered medications on file prior to visit.    ROS:  Review of Systems  Eyes: Positive for blurred vision.  Neurological: Positive for tingling, tremors, weakness and headaches.       Daily (H/A)  Numbness and tingling in her hands and feet prior to her having a stroke  Psychiatric/Behavioral: Positive for depression.  All other systems reviewed and are negative.   Physical Exam: Filed Weights   10/06/20 1428  Weight: 218 lb (98.9 kg)   BP 120/79 (BP Location: Right Arm, Patient Position: Sitting, Cuff Size: Large)   Pulse 69   Temp (!) 97.3 F (36.3 C) (Temporal)   Ht 5\' 6"  (1.676 m)   Wt 218 lb (98.9 kg)    SpO2 93%   BMI 35.19 kg/m  General Appearance: Well nourished, obese female in no apparent distress. Eyes: PERRLA, EOMs, conjunctiva no swelling or erythema Sinuses: No Frontal/maxillary tenderness ENT/Mouth: Ext aud canals clear, TMs without erythema, bulging.Hearing normal.  Neck: Supple, thyroid normal.  Respiratory: Respiratory effort normal, BS equal bilaterally without rales, rhonchi, wheezing or stridor.  Cardio: RRR with no MRGs. Brisk peripheral pulses without edema.  Abdomen: Soft, + BS.  Non tender, no guarding, rebound, hernias, masses. Lymphatics: Non tender without lymphadenopathy.  Musculoskeletal: Full ROM, 5/5 strength, normal gait.  Skin: Warm, dry without rashes, lesions, ecchymosis.  Neuro: Cranial nerves intact. Normal muscle tone, no cerebellar symptoms. Sensation intact.  Psych: Awake and oriented X 3, normal affect, Insight and Judgment appropriate.   \ Tatia was seen today for hospitalization follow-up.  Diagnoses and all orders for this visit:  Hospital discharge follow-up  Follow-Ups per discharge (Retrieved)Per hospital    1 Schedule an appointment with [facility]; Please use this location for your pharmacy needs - meds refilland sent  2 Follow up with [facility] (Family Medicine) on 10/06/2020; Your appointment is at 2:30 pm. Please arrive early and bring a picutre ID and your current medications.  Visit completed  3 Follow up with [facility] (Cardiology)   Establishing care with new doctor, encounter for Establish care with PCP-   Benign essential hypertension Blood pressure is well controlled today 120/79 she is currently on losartan 25 mg daily monotherapy. Blood pressure goal  Is meet less than 130/80, low-sodium, DASH diet, medication compliance, 150 minutes of moderate intensity exercise per week. Discussed medication compliance, adverse effects.   Moderate  recurrent major depression (HCC) Refilled lexapro 10mg  nightly    Acute CVA (cerebrovascular accident) ([place]) .  Follow-up neurology 10/22/20   Kerin Perna, NP 2:38 PM

## 2020-10-13 ENCOUNTER — Other Ambulatory Visit: Payer: Self-pay

## 2020-10-19 ENCOUNTER — Other Ambulatory Visit: Payer: Self-pay

## 2020-10-19 ENCOUNTER — Other Ambulatory Visit (INDEPENDENT_AMBULATORY_CARE_PROVIDER_SITE_OTHER): Payer: Self-pay | Admitting: Primary Care

## 2020-10-19 ENCOUNTER — Other Ambulatory Visit: Payer: Self-pay | Admitting: *Deleted

## 2020-10-19 ENCOUNTER — Telehealth: Payer: Self-pay

## 2020-10-19 MED FILL — Clopidogrel Bisulfate Tab 75 MG (Base Equiv): ORAL | 30 days supply | Qty: 30 | Fill #0 | Status: AC

## 2020-10-19 NOTE — Telephone Encounter (Signed)
  Notes to clinic:  medication filled by different provider  Review for refill     Requested Prescriptions  Pending Prescriptions Disp Refills   gabapentin (NEURONTIN) 100 MG capsule 90 capsule 0    Sig: TAKE 1 CAPSULE (100 MG TOTAL) BY MOUTH THREE TIMES DAILY.      Neurology: Anticonvulsants - gabapentin Passed - 10/19/2020 10:27 AM      Passed - Valid encounter within last 12 months    Recent Outpatient Visits           1 week ago Hospital discharge follow-up   [place], [place], NP   7 years ago Low back pain   [facility], [place] Fossa, DO       Future Appointments             In 3 days [name], NP [name]   In 1 week Kerin Perna, NP [place]

## 2020-10-19 NOTE — Patient Outreach (Signed)
[facility]) Care Management  10/19/2020  Sarah Simpson Feb 16, 1966 [id number]   [facility] Unsuccessful outreach  [name] was referred to [facility] on 09/23/20 for EMMI stroke red alerts after a discharge from [name]  She is not on the APL  RED ON [place] Day# 13 Date Friday October 02 2020 [place] Reason:       Had follow-up appointment? No And Day #3Date:Tuesday 09/22/20 1301 Red Alert Reason: Questions/problems with meds? Yes  Insurance:pending medicaid [facility] x1ED visits x 1in the last 6 months [place] discharge     Last successful outreach was on 10/02/20 when the above red alert reasons were reviewed and resolved   Outreach attempt to the home number today 10/19/20 No answer. [facility] RN CM left HIPAA [place] [facility] Portability and Accountability Act) compliant voicemail message along with CM's contact info.   Plan: [facility] RN CM scheduled this patient for another call attempt within 4-7 business days  Amarii Bordas L. Lavina Hamman, RN, BSN, [place] Coordinator Office number [phone] Mobile number [phone]  Main [facility] number [phone] Fax number [phone]

## 2020-10-19 NOTE — Telephone Encounter (Signed)
Call received from Jacqualin Combes, RN CM at [facility]. The patient had contacted her because she was in need of refills  of her medications. The patient has no insurance.   Informed Kelli that [facility] is working on filling all medications except the gabapentin. A new prescription is needed.  Some of the medications may be free of charge through [facility]. The patient will also need to apply for a Blue Card and speak with [place] about patient assistance programs to help with the cost of her meds. . The patient is also eligible for a one time free fill of medications if she chooses.    Vida Roller said that she would inform the patient of this information from [place] and instruct her to contact the pharmacy with any further questions.   The patient had her prescriptions filled initially at [facility] and the meds there are only filled for 1 month.  The patient plans to pick up meds at [facility] today.   Informed Kelli that Ms Oletta Lamas, NP would be notified of the refill request for gabapentin.

## 2020-10-20 ENCOUNTER — Ambulatory Visit (INDEPENDENT_AMBULATORY_CARE_PROVIDER_SITE_OTHER): Payer: Self-pay

## 2020-10-20 ENCOUNTER — Emergency Department (HOSPITAL_BASED_OUTPATIENT_CLINIC_OR_DEPARTMENT_OTHER): Payer: Self-pay | Admitting: Radiology

## 2020-10-20 ENCOUNTER — Emergency Department (HOSPITAL_BASED_OUTPATIENT_CLINIC_OR_DEPARTMENT_OTHER)
Admission: EM | Admit: 2020-10-20 | Discharge: 2020-10-20 | Disposition: A | Discharge status: Home / Self Care | Payer: Self-pay | Attending: Emergency Medicine | Admitting: Emergency Medicine

## 2020-10-20 ENCOUNTER — Other Ambulatory Visit: Payer: Self-pay

## 2020-10-20 ENCOUNTER — Telehealth (INDEPENDENT_AMBULATORY_CARE_PROVIDER_SITE_OTHER): Payer: Self-pay

## 2020-10-20 ENCOUNTER — Encounter (HOSPITAL_BASED_OUTPATIENT_CLINIC_OR_DEPARTMENT_OTHER): Payer: Self-pay

## 2020-10-20 ENCOUNTER — Other Ambulatory Visit (INDEPENDENT_AMBULATORY_CARE_PROVIDER_SITE_OTHER): Payer: Self-pay | Admitting: Primary Care

## 2020-10-20 DIAGNOSIS — Y9301 Activity, walking, marching and hiking: Secondary | ICD-10-CM | POA: Insufficient documentation

## 2020-10-20 DIAGNOSIS — Z7902 Long term (current) use of antithrombotics/antiplatelets: Secondary | ICD-10-CM | POA: Insufficient documentation

## 2020-10-20 DIAGNOSIS — S93601A Unspecified sprain of right foot, initial encounter: Secondary | ICD-10-CM | POA: Insufficient documentation

## 2020-10-20 DIAGNOSIS — Z79899 Other long term (current) drug therapy: Secondary | ICD-10-CM | POA: Insufficient documentation

## 2020-10-20 DIAGNOSIS — Z87891 Personal history of nicotine dependence: Secondary | ICD-10-CM | POA: Insufficient documentation

## 2020-10-20 DIAGNOSIS — S8001XA Contusion of right knee, initial encounter: Secondary | ICD-10-CM | POA: Insufficient documentation

## 2020-10-20 DIAGNOSIS — Z7982 Long term (current) use of aspirin: Secondary | ICD-10-CM | POA: Insufficient documentation

## 2020-10-20 DIAGNOSIS — I10 Essential (primary) hypertension: Secondary | ICD-10-CM | POA: Insufficient documentation

## 2020-10-20 DIAGNOSIS — W010XXA Fall on same level from slipping, tripping and stumbling without subsequent striking against object, initial encounter: Secondary | ICD-10-CM | POA: Insufficient documentation

## 2020-10-20 MED ORDER — CLOPIDOGREL BISULFATE 75 MG PO TABS
75.0000 mg | ORAL_TABLET | Freq: Every day | ORAL | 1 refills | Status: DC
  Filled 2020-10-20: qty 30, 30d supply, fill #0

## 2020-10-20 MED ORDER — GABAPENTIN 100 MG PO CAPS
ORAL_CAPSULE | ORAL | 0 refills | Status: DC
  Filled 2020-10-20: qty 90, 30d supply, fill #0

## 2020-10-20 NOTE — Telephone Encounter (Signed)
Copied from [place] [phone]. Topic: General - Other >> Oct 19, 2020 10:07 AM [place] wrote: Medication Refill - Medication: clopidogrel (PLAVIX) 75 MG tablet 30 tablet 1 09/19/2020   Sig - Route: Take 1 tablet (75 mg total) by mouth daily. - Oral  Sent to pharmacy as: clopidogrel (PLAVIX) 75 MG tablet  E-Prescribing Status: Receipt confirmed by pharmacy (09/18/2020 3:49 PM EDT)   gabapentin (NEURONTIN) 100 MG capsule 90 capsule 0 09/18/2020 09/18/2021  Sig: TAKE 1 CAPSULE (100 MG TOTAL) BY MOUTH THREE TIMES DAILY   [place] [facility] [place], [place]  Pt is stating out of all meds and these meds were not transferred to [facility]. Pt wanted an additional note sent to [facility] as was told that the pharmacy was sending request.   Has the patient contacted their pharmacy? Yes  (Agent: If no, request that the patient contact the pharmacy for the refill.) fowarding to dr (Agent: If yes, when and what did the pharmacy advise?)  Preferred Pharmacy (with phone number or street name): [facility] and [place]  Phone:  [phone] Fax:  [phone]     Agent: Please be advised that RX refills may take up to 3 business days. We ask that you follow-up with your pharmacy.

## 2020-10-20 NOTE — Discharge Instructions (Signed)
Rest, elevate, ice and Tylenol.  Do not do excessive standing or walking until pain improves.  If pain is not improving you need to follow-up with orthopedics.

## 2020-10-20 NOTE — Telephone Encounter (Signed)
Pt called. She had fallen at 11:45 AM 10/20/2020.  She states that both knee and ankle are swollen and very painful. She is unable to put weight on right leg. She has used ice packs with no relief.  Pt [facility].  Reason for Disposition . Can't stand (bear weight) or walk  Answer Assessment - Initial Assessment Questions 1. MECHANISM: "How did the injury happen?" (e.g., twisting injury, direct blow)      Tripped and fell. Hit knee on a threshold and twisted ankle 2. ONSET: "When did the injury happen?" (Minutes or hours ago)      11:45AM 10/20/2020 3. LOCATION: "Where is the injury located?"    Right Knee and ankle 4. APPEARANCE of INJURY: "What does the injury look like?"      Very swollen 5. WEIGHT-BEARING: "Can you put weight on that foot?" "Can you walk (four steps or more)?"       No. 6. SIZE: For cuts, bruises, or swelling, ask: "How large is it?" (e.g., inches or centimeters;  entire joint)      Right knee  is significantly larger . 7. PAIN: "Is there pain?" If Yes, ask: "How bad is the pain?"    (e.g., Scale 1-10; or mild, moderate, severe)   - NONE (0): no pain.   - MILD (1-3): doesn't interfere with normal activities.    - MODERATE (4-7): interferes with normal activities (e.g., work or school) or awakens from sleep, limping.    - SEVERE (8-10): excruciating pain, unable to do any normal activities, unable to walk.      8 - unable towalk 8. TETANUS: For any breaks in the skin, ask: "When was the last tetanus booster?"     NA 9. OTHER SYMPTOMS: "Do you have any other symptoms?"      NA 10. PREGNANCY: "Is there any chance you are pregnant?" "When was your last menstrual period?"       NA  Protocols used: ANKLE AND FOOT INJURY-A-AH

## 2020-10-20 NOTE — Telephone Encounter (Signed)
   Reason for Disposition . Can't stand (bear weight) or walk  Protocols used: ANKLE AND FOOT INJURY-A-AH

## 2020-10-20 NOTE — ED Provider Notes (Signed)
[facility] EMERGENCY DEPT Provider Note   CSN: [id number] Arrival date & time: 10/20/20  2002     History Chief Complaint  Patient presents with  . Fall    R. Foot, Ankle, Knee.    Sarah Simpson is a 55 y.o. female.  The history is provided by the patient.  Fall This is a new (Patient was walking when she tripped and she fell forward landing directly on her knee onto a wooden threshold) problem. The current episode started 6 to 12 hours ago. The problem occurs constantly. The problem has been gradually improving. Associated symptoms comments: Pain in the right knee and foot.. The symptoms are aggravated by walking and bending. The symptoms are relieved by rest, ice and acetaminophen. She has tried acetaminophen and rest for the symptoms. The treatment provided mild relief.       Past Medical History:  Diagnosis Date  . ADD (attention deficit disorder)   . Allergy   . Anemia   . Arthritis    OA back   . Cyst of right ovary   . Depression   . Heart murmur   . History of kidney stones   . Hypertension   . Neuromuscular disorder (HCC)    RLS  . Restless leg syndrome   . Seizures ([place])    in her 64's- none since-? etiology     Patient Active Problem List   Diagnosis Date Noted  . Family history of malignant neoplasm of gastrointestinal tract 10/02/2020  . Genital herpes simplex 10/02/2020  . Menopause 10/02/2020  . Moderate recurrent major depression ([place]) 10/02/2020  . New daily persistent headache 10/02/2020  . Obesity 10/02/2020  . Prediabetes 10/02/2020  . Stroke (cerebrum) ([place]) 09/16/2020  . Acute CVA (cerebrovascular accident) ([place]) 09/15/2020  . Benign essential hypertension 09/15/2020    Past Surgical History:  Procedure Laterality Date  . APPENDECTOMY  2008  . BUBBLE STUDY  09/18/2020   Procedure: BUBBLE STUDY;  Surgeon: Fay Records, MD;  Location: [facility];  Service: Cardiovascular;;  . Lillard Anes  2009   x 2  . TEE WITHOUT  CARDIOVERSION N/A 09/18/2020   Procedure: TRANSESOPHAGEAL ECHOCARDIOGRAM (TEE);  Surgeon: Fay Records, MD;  Location: [facility];  Service: Cardiovascular;  Laterality: N/A;  . WISDOM TOOTH EXTRACTION       OB History   No obstetric history on file.     Family History  Problem Relation Age of Onset  . Colon cancer Father 69  . Colon cancer Mother 24  . Heart attack Mother   . Dementia Mother   . Colon polyps Neg Hx   . Esophageal cancer Neg Hx   . Stomach cancer Neg Hx   . Rectal cancer Neg Hx     Social History   Tobacco Use  . Smoking status: Former Research scientist (life sciences)  . Smokeless tobacco: Never Used  . Tobacco comment: on and off in the past, quit in 2002  Vaping Use  . Vaping Use: Never used  Substance Use Topics  . Alcohol use: Yes    Comment: occ  . Drug use: Never    Home Medications Prior to Admission medications   Medication Sig Start Date End Date Taking? Authorizing Provider  aspirin 81 MG EC tablet TAKE 1 TABLET (81 MG TOTAL) BY MOUTH DAILY. SWALLOW WHOLE. Patient taking differently: Take by mouth 5 (five) times daily. SWALLOW WHOLE. 09/18/20 09/18/21  Kathie Dike, MD  atorvastatin (LIPITOR) 80 MG tablet Take 1 tablet (80 mg total)  by mouth daily at 6 PM. 10/06/20   Kerin Perna, NP  clopidogrel (PLAVIX) 75 MG tablet Take 1 tablet (75 mg total) by mouth daily. 10/20/20   Kerin Perna, NP  diphenhydrAMINE (BENADRYL) 25 mg capsule Take 25 mg by mouth every 6 (six) hours as needed.    [provider]  escitalopram (LEXAPRO) 10 MG tablet TAKE 1 TABLET (10 MG TOTAL) BY MOUTH DAILY. 10/06/20 10/06/21  Kerin Perna, NP  gabapentin (NEURONTIN) 100 MG capsule TAKE 1 CAPSULE (100 MG TOTAL) BY MOUTH THREE TIMES DAILY. 10/20/20 10/20/21  Kerin Perna, NP  losartan (COZAAR) 25 MG tablet Take 1 tablet (25 mg total) by mouth daily. 10/06/20   Kerin Perna, NP  Melatonin 3 MG TABS Take 3 mg by mouth at bedtime as needed (sleep).    [provider]  Multiple Vitamin (MULTIVITAMIN) tablet Take 1 tablet by mouth daily.    [provider]  valACYclovir (VALTREX) 500 MG tablet Take 500 mg by mouth daily. 04/05/17   [provider]    Allergies    Ciprofloxacin, Levaquin [levofloxacin], Amoxil [amoxicillin], Dynacin [minocycline], and Sulfamethazine  Review of Systems   Review of Systems  All other systems reviewed and are negative.   Physical Exam Updated Vital Signs BP (!) 142/75 (BP Location: Left Arm)   Pulse 82   Temp 100 F (37.8 C) (Oral)   Resp 16   Ht 5\' 7"  (1.702 m)   Wt 99.8 kg   SpO2 96%   BMI 34.46 kg/m   Physical Exam Vitals and nursing note reviewed.  Constitutional:      General: She is not in acute distress. HENT:     Head: Normocephalic.  Cardiovascular:     Rate and Rhythm: Normal rate.     Pulses: Normal pulses.  Pulmonary:     Effort: Pulmonary effort is normal.  Musculoskeletal:        General: Tenderness present.     Right knee: Swelling and bony tenderness present. Decreased range of motion. Tenderness present. No medial joint line, lateral joint line, MCL or LCL tenderness.     Right ankle: Normal.     Right Achilles Tendon: Normal.     Right foot: Decreased range of motion. Tenderness and bony tenderness present. No swelling. Normal pulse.       Feet:  Skin:    General: Skin is warm and dry.  Neurological:     Mental Status: She is alert. Mental status is at baseline.  Psychiatric:        Mood and Affect: Mood normal.        Behavior: Behavior normal.     ED Results / Procedures / Treatments   Labs (all labs ordered are listed, but only abnormal results are displayed) Labs Reviewed - No data to display  EKG None  Radiology DG Knee Complete 4 Views Right  Result Date: 10/20/2020 CLINICAL DATA:  Fall with knee pain EXAM: RIGHT KNEE - COMPLETE 4+ VIEW COMPARISON:  None. FINDINGS: No evidence of fracture, dislocation, or joint effusion. No  evidence of arthropathy or other focal bone abnormality. Soft tissues are unremarkable. IMPRESSION: Negative. Electronically Signed   By: Donavan Foil M.D.   On: 10/20/2020 21:14   DG Foot Complete Right  Result Date: 10/20/2020 CLINICAL DATA:  Fall, foot pain EXAM: RIGHT FOOT COMPLETE - 3+ VIEW COMPARISON:  11/03/2016 FINDINGS: No fracture or malalignment. Postsurgical changes of the first metatarsal. No radiopaque foreign  body. Moderate plantar calcaneal spur IMPRESSION: No acute osseous abnormality Electronically Signed   By: Donavan Foil M.D.   On: 10/20/2020 21:14    Procedures Procedures   Medications Ordered in ED Medications - No data to display  ED Course  I have reviewed the triage vital signs and the nursing notes.  Pertinent labs & imaging results that were available during my care of the patient were reviewed by me and considered in my medical decision making (see chart for details).    MDM Rules/Calculators/A&P                          Patient presenting after a trip and fall approximately 11 AM this morning.  She has had ongoing pain in her right knee and her foot.  It is worse with flexion of the knee.  On exam she does have patella tenderness and swelling.  Concern for possible patellar fracture.  No posterior knee tenderness or medial or lateral joint line tenderness.  Patient is able to flex the knee but produces significant pain.  Also she has pain in the midfoot but no significant ankle tenderness.  She denies hitting her head or losing consciousness.  Plain films are pending.  10:35 PM Plain films are neg.  Pt given supportive care.  MDM Number of Diagnoses or Management Options   Amount and/or Complexity of Data Reviewed Tests in the radiology section of CPT: ordered and reviewed Independent visualization of images, tracings, or specimens: yes  Risk of Complications, Morbidity, and/or Mortality Presenting problems: low Diagnostic procedures:  low Management options: low  Patient Progress Patient progress: stable     Final Clinical Impression(s) / ED Diagnoses Final diagnoses:  Sprain of right foot, initial encounter  Contusion of right knee, initial encounter    Rx / DC Orders ED Discharge Orders    None       Blanchie Dessert, MD 10/20/20 2236

## 2020-10-20 NOTE — ED Triage Notes (Signed)
Patient here POV from Home with Knee and Ankle Pain (R. Sided).   Patient tripped and fell approx at [facility] Today.   Patient complaining of R. Foot, R. Ankle, and R. Knee Pain.   Ambulatory but Painful, GCS 15, NAD

## 2020-10-20 NOTE — Telephone Encounter (Signed)
Call placed to patient and informed her that the gabapentin has been refilled and is ready for pick up at [facility].  Cost is $4 per [facility].

## 2020-10-22 ENCOUNTER — Ambulatory Visit (INDEPENDENT_AMBULATORY_CARE_PROVIDER_SITE_OTHER): Payer: Self-pay | Admitting: Adult Health

## 2020-10-22 ENCOUNTER — Other Ambulatory Visit: Payer: Self-pay

## 2020-10-22 ENCOUNTER — Encounter: Payer: Self-pay | Admitting: Adult Health

## 2020-10-22 VITALS — BP 125/83 | HR 65 | Ht 66.0 in | Wt 219.0 lb

## 2020-10-22 DIAGNOSIS — E785 Hyperlipidemia, unspecified: Secondary | ICD-10-CM

## 2020-10-22 DIAGNOSIS — I10 Essential (primary) hypertension: Secondary | ICD-10-CM

## 2020-10-22 DIAGNOSIS — G441 Vascular headache, not elsewhere classified: Secondary | ICD-10-CM

## 2020-10-22 DIAGNOSIS — I639 Cerebral infarction, unspecified: Secondary | ICD-10-CM

## 2020-10-22 DIAGNOSIS — R202 Paresthesia of skin: Secondary | ICD-10-CM

## 2020-10-22 MED ORDER — GABAPENTIN 300 MG PO CAPS
300.0000 mg | ORAL_CAPSULE | Freq: Three times a day (TID) | ORAL | 5 refills | Status: DC
  Filled 2020-10-22: qty 90, 30d supply, fill #0

## 2020-10-22 NOTE — Progress Notes (Signed)
[name] [address] [place]. [place] [address] [phone]       HOSPITAL FOLLOW UP NOTE  [name] Date of Birth:  11-27-1965 Medical Record Number:  [id number]   Reason for Referral:  hospital stroke follow up    SUBJECTIVE:   CHIEF COMPLAINT:  Chief Complaint  Patient presents with  . Follow-up    Rm 14 friend (richard) Pt is well, still has some tingling in hands/feet, blurred vision, dizziness is getting better, has headaches, recent fall on tuesday.     HPI:   55 year old female with a history of hypertension, seizure like episodes in her 67s, heart murmur, migraine headaches,, tobacco use, obesity, chronic parasthesias bilat hands, depression, RLS, ADD who presented on 09/15/2020 with facial droop and speech difficulties that started 3/28. Personally reviewed hospitalization pertinent progress notes, lab work and imaging with summary provided.  Evaluated by Dr. Leonie Man with stroke work-up revealing acute subcortical infarct in the region of the caudate tail on the left due to large cortical infarct likely of cryptogenic etiology.  Recommended DAPT for 3 weeks and aspirin alone.  TEE negative for PFO or cardiac source of embolism identified.  Hypercoagulable labs negative. Loop recorder not placed prior to d/c. Hormonal therapy for postmenopausal hot flashes discontinued.  LDL 133 -recommend initiating atorvastatin 80 mg daily.  Complaints of bilateral paresthesias/headaches/rashes unclear etiology with brain and C-spine imaging no acute findings to explain her symptoms.  Vasculitis labs negative.  No prior stroke history.   Acute subcortical infarct in the region of the caudate tail on the left due to large subcortical infarct likely of cryptogenic etiology   CT Code Stroke: A small age-indeterminate lacunar infarct is questioned within the left pons. There is no acute intracranial hemorrhage.  MRI brain Diffusion abnormality in the  caudate tail on the left most consistent with stroke.  MRA head: Truncated examination. Small acute infarct of the left caudate tail. Normal intracranial MRA.  Carotid Doppler Nearly normal bilaterally, no high grade stenosis  2D Echo EF 60-65%, No thrombus, no wall motion abnormality or shunt found.   Bilat LE Doppler no evidence of DVT  Hypercoagulable labs negative  TEE EF 60 to 65%.  Negative for PFO.  No evidence of cardiac source of embolism identified  LDL 133  HgbA1c 5.7  No antiplatelet or anticoagulant medications prior to admission  Recommend DAPT with ASA 81mg  and Plavix 75mg  x 3 weeks then ASA 81mg  monotherapy  Therapy recommendations:  none  Disposition:   Home on 09/18/2020  Today, 10/22/2020, Sarah Simpson is being seen for hospital follow-up accompanied by her friend, Richard.  Reports residual blurred vision with reading and writing. Reports some visual impairment prior to her stroke having to use reading glasses but has had greater difficulty with reading even with using her reading glasses since her stroke.  She has returned back to almost all prior activities.  She has not worked since the onset of [place].  She is currently uninsured and in the process of applying for [employer] assistance program.  She also reports almost daily right sided headaches which have been present since her stroke.  She does have history of migraines but has not experienced any migraines since 01/2020 - these headaches are not consistent with her known migraines.  Reports continued constant bilateral hand and feet numbness/tingling.  Has been experiencing the symptoms for years but typically come and go but since her stroke they have been constant.  She will occasionally  experience painful sensation.  She was prescribed gabapentin 100 mg 3 times daily at hospital discharge but she has been tolerating but does not believe it has been beneficial  She has remained on DAPT despite 3-week  recommendation but denies bleeding or bruising Remains on atorvastatin 80 mg daily without myalgias Blood pressure today 125/83  No further concerns at this time      ROS:   14 system review of systems performed and negative with exception of those listed in HPI  PMH:  Past Medical History:  Diagnosis Date  . ADD (attention deficit disorder)   . Allergy   . Anemia   . Arthritis    OA back   . Cyst of right ovary   . Depression   . Heart murmur   . History of kidney stones   . Hypertension   . Neuromuscular disorder (HCC)    RLS  . Restless leg syndrome   . Seizures ([place])    in her 16's- none since-? etiology     PSH:  Past Surgical History:  Procedure Laterality Date  . APPENDECTOMY  2008  . BUBBLE STUDY  09/18/2020   Procedure: BUBBLE STUDY;  Surgeon: Fay Records, MD;  Location: [facility];  Service: Cardiovascular;;  . Lillard Anes  2009   x 2  . TEE WITHOUT CARDIOVERSION N/A 09/18/2020   Procedure: TRANSESOPHAGEAL ECHOCARDIOGRAM (TEE);  Surgeon: Fay Records, MD;  Location[facility];  Service: Cardiovascular;  Laterality: N/A;  . WISDOM TOOTH EXTRACTION      Social History:  Social History   Socioeconomic History  . Marital status: Single    Spouse name: Not on file  . Number of children: Not on file  . Years of education: Not on file  . Highest education level: Not on file  Occupational History  . Not on file  Tobacco Use  . Smoking status: Former Research scientist (life sciences)  . Smokeless tobacco: Never Used  . Tobacco comment: on and off in the past, quit in 2002  Vaping Use  . Vaping Use: Never used  Substance and Sexual Activity  . Alcohol use: Yes    Comment: occ  . Drug use: Never  . Sexual activity: Not on file  Other Topics Concern  . Not on file  Social History Narrative   Work or School: Biochemist, clinical - [employer]      Home Situation: none      Spiritual Beliefs: Christian      Lifestyle: no regular exercise; so so            Social Determinants  of Radio broadcast assistant Strain: Not on file  Food Insecurity: Not on file  Transportation Needs: Not on file  Physical Activity: Not on file  Stress: Not on file  Social Connections: Not on file  Intimate Partner Violence: Not on file    Family History:  Family History  Problem Relation Age of Onset  . Colon cancer Father 66  . Colon cancer Mother 6  . Heart attack Mother   . Dementia Mother   . Colon polyps Neg Hx   . Esophageal cancer Neg Hx   . Stomach cancer Neg Hx   . Rectal cancer Neg Hx     Medications:   Current Outpatient Medications on File Prior to Visit  Medication Sig Dispense Refill  . aspirin 81 MG EC tablet TAKE 1 TABLET (81 MG TOTAL) BY MOUTH DAILY. SWALLOW WHOLE. (Patient taking differently: Take by mouth 5 (  five) times daily. SWALLOW WHOLE.) 30 tablet 11  . atorvastatin (LIPITOR) 80 MG tablet Take 1 tablet (80 mg total) by mouth daily at 6 PM. 90 tablet 1  . clopidogrel (PLAVIX) 75 MG tablet Take 1 tablet (75 mg total) by mouth daily. 30 tablet 1  . diphenhydrAMINE (BENADRYL) 25 mg capsule Take 25 mg by mouth every 6 (six) hours as needed.    Marland Kitchen escitalopram (LEXAPRO) 10 MG tablet TAKE 1 TABLET (10 MG TOTAL) BY MOUTH DAILY. 90 tablet 1  . gabapentin (NEURONTIN) 100 MG capsule TAKE 1 CAPSULE (100 MG TOTAL) BY MOUTH THREE TIMES DAILY. 90 capsule 0  . losartan (COZAAR) 25 MG tablet Take 1 tablet (25 mg total) by mouth daily. 90 tablet 1  . Melatonin 3 MG TABS Take 3 mg by mouth at bedtime as needed (sleep).    . Multiple Vitamin (MULTIVITAMIN) tablet Take 1 tablet by mouth daily.    . valACYclovir (VALTREX) 500 MG tablet Take 500 mg by mouth daily.  4   No current facility-administered medications on file prior to visit.    Allergies:   Allergies  Allergen Reactions  . Ciprofloxacin Swelling  . Levaquin [Levofloxacin] Anaphylaxis    Hives and throat swelling   . Amoxil [Amoxicillin] Rash    Extreme itching limbs and trunk   . Dynacin  [Minocycline] Rash  . Sulfamethazine Rash      OBJECTIVE:  Physical Exam  Vitals:   10/22/20 1105  BP: 125/83  Pulse: 65  Weight: 219 lb (99.3 kg)  Height: 5\' 6"  (1.676 m)   Body mass index is 35.35 kg/m. No exam data present  General: well developed, well nourished,  pleasant middle-age Caucasian female, seated, in no evident distress Head: head normocephalic and atraumatic.   Neck: supple with no carotid or supraclavicular bruits Cardiovascular: regular rate and rhythm, no murmurs Musculoskeletal: Mild right ankle swelling post fall Skin:  no rash/petichiae Vascular:  Normal pulses all extremities   Neurologic Exam Mental Status: Awake and fully alert.   For speech and language.  Oriented to place and time. Recent and remote memory intact. Attention span, concentration and fund of knowledge appropriate. Mood and affect appropriate.  Cranial Nerves: Fundoscopic exam reveals sharp disc margins. Pupils equal, briskly reactive to light. Extraocular movements full without nystagmus. Visual fields full to confrontation. Hearing intact. Facial sensation intact. Face, tongue, palate moves normally and symmetrically.  Motor: Normal bulk and tone. Normal strength in all tested extremity muscles Sensory.: intact to touch , pinprick , position and vibratory sensation.  Coordination: Rapid alternating movements normal in all extremities. Finger-to-nose and heel-to-shin performed accurately bilaterally. Gait and Station: Arises from chair without difficulty. Stance is normal. Gait demonstrates normal stride length and balance with mild favoring of right ankle due to recent injury s/p fall. Tandem walk and heel toe not attempted.  Reflexes: 1+ and symmetric. Toes downgoing.     NIHSS  0 Modified Rankin  1-2      ASSESSMENT: Sarah Simpson is a 55 y.o. year old female presented on 09/15/2020 with facial weakness and speech difficulty that started the day prior with stroke work-up  revealing acute subcortical infarct left caudate tail likely of cryptogenic etiology. Vascular risk factors include HTN, HLD, remote seizure history, migraine headaches, former tobacco use and obesity.      PLAN:  1. Cryptogenic stroke:  a. Residual deficit: Subjective blurred vision and headache.   i. Vascular headache: Increase gabapentin to 300mg  3 times daily - if  this helps paresthesias but headaches persist, will consider starting topiramate ii. Subjective blurred vision: Encouraged further evaluation with ophthalmology - plans to pursue once insurance or financial assistance obtained b. Plan on loop recorder placement once insurance or financial assistance secured.   c. Continue aspirin 81 mg daily  and atorvastatin 80 mg daily for secondary stroke prevention.  Advised to discontinue Plavix as prolonged DAPT not indicated d. Discussed secondary stroke prevention measures and importance of close PCP follow up for aggressive stroke risk factor management  2. HTN: BP goal <130/90.  Stable on losartan per PCP 3. HLD: LDL goal <70. Recent LDL 133 -initiated atorvastatin 80 mg daily during recent stroke admission.  Request follow-up with PCP in the next 1 to 2 months for repeat lipid panel 4. Bilateral hand and feet paresthesias: Increase gabapentin dosage from 100 mg 3 times daily to 300 mg 3 times daily.  She is currently uninsured therefore will hold off on any further evaluation due to financial reasons.  She was advised to call office if/when she is able to get insured or obtain [facility] Financial assistance and we will plan on further evaluation at that time    Follow up in 4 months or call earlier if needed   CC:  [facility] provider: Dr. Leonie Man PCP: Kerin Perna, NP    I spent 56 minutes of face-to-face and non-face-to-face time with patient and friend.  This included previsit chart review including extensive review of recent hospitalization and recent PCP OV note, recent stroke and  possible underlying etiologies as well as residual deficits, secondary stroke prevention measures and importance of aggressive stroke risk factor management as well as medication compliance, chronic bilateral hand and feet paresthesias and possible etiologies and indication for further evaluation and answered all other questions to patient and friend satisfaction  [name], AGNP-BC  [facility] [address] [place], [place] [address]  Phone [phone] Fax [phone] Note: This document was prepared with digital dictation and possible smart phrase technology. Any transcriptional errors that result from this process are unintentional.

## 2020-10-22 NOTE — Patient Instructions (Addendum)
Increase gabapentin to 300 mg three times daily   Continue aspirin 81 mg daily  and atorvastatin  for secondary stroke prevention  Stop plavix at this time as no longer indicated   We will plan on doing further work up for numbness/tingling and further stroke etiology evaluation   Continue to follow up with PCP regarding cholesterol and blood pressure management  Maintain strict control of hypertension with blood pressure goal below 130/90 and cholesterol with LDL cholesterol (bad cholesterol) goal below 70 mg/dL.       Followup in the future with me in 4 months or call earlier if needed       Thank you for coming to see [place] at [facility]. I hope we have been able to provide you high quality care today.  You may receive a patient satisfaction survey over the next few weeks. We would appreciate your feedback and comments so that we may continue to improve ourselves and the health of our patients.   Stroke Prevention Some medical conditions and behaviors are associated with a higher chance of having a stroke. You can help prevent a stroke by making nutrition, lifestyle, and other changes, including managing any medical conditions you may have. What nutrition changes can be made?  Eat healthy foods. You can do this by: ? Choosing foods high in fiber, such as fresh fruits and vegetables and whole grains. ? Eating at least 5 or more servings of fruits and vegetables a day. Try to fill half of your plate at each meal with fruits and vegetables. ? Choosing lean protein foods, such as lean cuts of meat, poultry without skin, fish, tofu, beans, and nuts. ? Eating low-fat dairy products. ? Avoiding foods that are high in salt (sodium). This can help lower blood pressure. ? Avoiding foods that have saturated fat, trans fat, and cholesterol. This can help prevent high cholesterol. ? Avoiding processed and premade foods.  Follow your health care provider's specific  guidelines for losing weight, controlling high blood pressure (hypertension), lowering high cholesterol, and managing diabetes. These may include: ? Reducing your daily calorie intake. ? Limiting your daily sodium intake to 1,500 milligrams (mg). ? Using only healthy fats for cooking, such as olive oil, canola oil, or sunflower oil. ? Counting your daily carbohydrate intake.   What lifestyle changes can be made?  Maintain a healthy weight. Talk to your health care provider about your ideal weight.  Get at least 30 minutes of moderate physical activity at least 5 days a week. Moderate activity includes brisk walking, biking, and swimming.  Do not use any products that contain nicotine or tobacco, such as cigarettes and e-cigarettes. If you need help quitting, ask your health care provider. It may also be helpful to avoid exposure to secondhand smoke.  Limit alcohol intake to no more than 1 drink a day for nonpregnant women and 2 drinks a day for men. One drink equals 12 oz of beer, 5 oz of wine, or 1 oz of hard liquor.  Stop any illegal drug use.  Avoid taking birth control pills. Talk to your health care provider about the risks of taking birth control pills if: ? You are over 105 years old. ? You smoke. ? You get migraines. ? You have ever had a blood clot. What other changes can be made?  Manage your cholesterol levels. ? Eating a healthy diet is important for preventing high cholesterol. If cholesterol cannot be managed through diet alone, you may also  need to take medicines. ? Take any prescribed medicines to control your cholesterol as told by your health care provider.  Manage your diabetes. ? Eating a healthy diet and exercising regularly are important parts of managing your blood sugar. If your blood sugar cannot be managed through diet and exercise, you may need to take medicines. ? Take any prescribed medicines to control your diabetes as told by your health care  provider.  Control your hypertension. ? To reduce your risk of stroke, try to keep your blood pressure below 130/80. ? Eating a healthy diet and exercising regularly are an important part of controlling your blood pressure. If your blood pressure cannot be managed through diet and exercise, you may need to take medicines. ? Take any prescribed medicines to control hypertension as told by your health care provider. ? Ask your health care provider if you should monitor your blood pressure at home. ? Have your blood pressure checked every year, even if your blood pressure is normal. Blood pressure increases with age and some medical conditions.  Get evaluated for sleep disorders (sleep apnea). Talk to your health care provider about getting a sleep evaluation if you snore a lot or have excessive sleepiness.  Take over-the-counter and prescription medicines only as told by your health care provider. Aspirin or blood thinners (antiplatelets or anticoagulants) may be recommended to reduce your risk of forming blood clots that can lead to stroke.  Make sure that any other medical conditions you have, such as atrial fibrillation or atherosclerosis, are managed. What are the warning signs of a stroke? The warning signs of a stroke can be easily remembered as BEFAST.  B is for balance. Signs include: ? Dizziness. ? Loss of balance or coordination. ? Sudden trouble walking.  E is for eyes. Signs include: ? A sudden change in vision. ? Trouble seeing.  F is for face. Signs include: ? Sudden weakness or numbness of the face. ? The face or eyelid drooping to one side.  A is for arms. Signs include: ? Sudden weakness or numbness of the arm, usually on one side of the body.  S is for speech. Signs include: ? Trouble speaking (aphasia). ? Trouble understanding.  T is for time. ? These symptoms may represent a serious problem that is an emergency. Do not wait to see if the symptoms will go away.  Get medical help right away. Call your local emergency services (911 in the [place] Do not drive yourself to the hospital.  Other signs of stroke may include: ? A sudden, severe headache with no known cause. ? Nausea or vomiting. ? Seizure. Where to find more information For more information, visit:  American Stroke Association: www.strokeassociation.org  National Stroke Association: www.stroke.org Summary  You can prevent a stroke by eating healthy, exercising, not smoking, limiting alcohol intake, and managing any medical conditions you may have.  Do not use any products that contain nicotine or tobacco, such as cigarettes and e-cigarettes. If you need help quitting, ask your health care provider. It may also be helpful to avoid exposure to secondhand smoke.  Remember BEFAST for warning signs of stroke. Get help right away if you or a loved one has any of these signs. This information is not intended to replace advice given to you by your health care provider. Make sure you discuss any questions you have with your health care provider. Document Revised: 05/19/2017 Document Reviewed: 07/12/2016 Elsevier Patient Education  2021 [employer].

## 2020-10-26 NOTE — Progress Notes (Signed)
I agree with the above plan 

## 2020-10-27 ENCOUNTER — Other Ambulatory Visit: Payer: Self-pay | Admitting: *Deleted

## 2020-10-27 NOTE — Patient Outreach (Signed)
[facility]) Care Management  10/27/2020  [name] 1965/09/23 [id number]   [facility] second Unsuccessful outreach  Ms Sarah Simpson was referred to [facility] on 09/23/20 for EMMI stroke red alerts after a discharge from [facility]   She is not on the APL  RED ON [place] Day# 13 Date Friday October 02 2020 [place] Reason: Had follow-up appointment?No And Day #3Date:Tuesday 09/22/20 1301 Red Alert Reason: Questions/problems with meds? Yes  Insurance:pending medicaid [facility] admissions x1ED visits x 1in the last 6 months [place] discharge     Last successful outreach was on 10/02/20 when the above red alert reasons were reviewed and resolved   Outreach attempt to the home number 10/19/20 & 10/27/20 unsuccessful No answer. [facility] RN CM left HIPAA [facility] Portability and Accountability Act) compliant voicemail message along with CM's contact info.   Plan: [facility] RN CM scheduled this patient for another call attempt within 4-7 business days  Jafeth Mustin L. Lavina Hamman, RN, BSN, [place] Coordinator Office number [phone] Mobile number [phone]  Main [facility] number [phone] Fax number [phone]

## 2020-10-29 ENCOUNTER — Ambulatory Visit (INDEPENDENT_AMBULATORY_CARE_PROVIDER_SITE_OTHER): Payer: Self-pay | Admitting: Primary Care

## 2020-10-29 ENCOUNTER — Other Ambulatory Visit: Payer: Self-pay

## 2020-10-29 ENCOUNTER — Encounter (INDEPENDENT_AMBULATORY_CARE_PROVIDER_SITE_OTHER): Payer: Self-pay | Admitting: Primary Care

## 2020-10-29 VITALS — BP 134/81 | HR 66 | Temp 97.3°F | Ht 66.0 in | Wt 219.6 lb

## 2020-10-29 DIAGNOSIS — Z0000 Encounter for general adult medical examination without abnormal findings: Secondary | ICD-10-CM

## 2020-10-29 DIAGNOSIS — I639 Cerebral infarction, unspecified: Secondary | ICD-10-CM

## 2020-10-29 DIAGNOSIS — F331 Major depressive disorder, recurrent, moderate: Secondary | ICD-10-CM

## 2020-10-29 DIAGNOSIS — Z760 Encounter for issue of repeat prescription: Secondary | ICD-10-CM

## 2020-10-29 DIAGNOSIS — Z23 Encounter for immunization: Secondary | ICD-10-CM

## 2020-10-29 DIAGNOSIS — I10 Essential (primary) hypertension: Secondary | ICD-10-CM

## 2020-10-29 DIAGNOSIS — Z1231 Encounter for screening mammogram for malignant neoplasm of breast: Secondary | ICD-10-CM

## 2020-10-29 MED ORDER — ATORVASTATIN CALCIUM 80 MG PO TABS
80.0000 mg | ORAL_TABLET | Freq: Every day | ORAL | 1 refills | Status: DC
  Filled 2020-10-29 – 2020-11-11 (×2): qty 90, 90d supply, fill #0
  Filled 2021-02-01: qty 90, 90d supply, fill #1

## 2020-10-29 MED ORDER — LOSARTAN POTASSIUM 25 MG PO TABS
25.0000 mg | ORAL_TABLET | Freq: Every day | ORAL | 1 refills | Status: DC
  Filled 2020-10-29 – 2020-11-11 (×2): qty 90, 90d supply, fill #0
  Filled 2021-02-01: qty 90, 90d supply, fill #1

## 2020-10-29 MED ORDER — ESCITALOPRAM OXALATE 10 MG PO TABS
1.0000 | ORAL_TABLET | Freq: Every day | ORAL | 1 refills | Status: DC
  Filled 2020-10-29 – 2021-01-11 (×2): qty 90, 90d supply, fill #0
  Filled 2021-04-04: qty 90, 90d supply, fill #1
  Filled 2021-04-16: qty 30, 30d supply, fill #1
  Filled 2021-05-06 – 2021-05-14 (×2): qty 30, 30d supply, fill #2
  Filled 2021-05-17: qty 60, 60d supply, fill #2

## 2020-10-29 NOTE — Progress Notes (Signed)
Pt is requesting 90 day supply of all prescriptions

## 2020-10-29 NOTE — Patient Instructions (Signed)
Calorie Counting for Weight Loss Calories are units of energy. Your body needs a certain number of calories from food to keep going throughout the day. When you eat or drink more calories than your body needs, your body stores the extra calories mostly as fat. When you eat or drink fewer calories than your body needs, your body burns fat to get the energy it needs. Calorie counting means keeping track of how many calories you eat and drink each day. Calorie counting can be helpful if you need to lose weight. If you eat fewer calories than your body needs, you should lose weight. Ask your health care provider what a healthy weight is for you. For calorie counting to work, you will need to eat the right number of calories each day to lose a healthy amount of weight per week. A dietitian can help you figure out how many calories you need in a day and will suggest ways to reach your calorie goal.  A healthy amount of weight to lose each week is usually 1-2 lb (0.5-0.9 kg). This usually means that your daily calorie intake should be reduced by 500-750 calories.  Eating 1,200-1,500 calories a day can help most women lose weight.  Eating 1,500-1,800 calories a day can help most men lose weight. What do I need to know about calorie counting? Work with your health care provider or dietitian to determine how many calories you should get each day. To meet your daily calorie goal, you will need to:  Find out how many calories are in each food that you would like to eat. Try to do this before you eat.  Decide how much of the food you plan to eat.  Keep a food log. Do this by writing down what you ate and how many calories it had. To successfully lose weight, it is important to balance calorie counting with a healthy lifestyle that includes regular activity. Where do I find calorie information? The number of calories in a food can be found on a Nutrition Facts label. If a food does not have a Nutrition Facts  label, try to look up the calories online or ask your dietitian for help. Remember that calories are listed per serving. If you choose to have more than one serving of a food, you will have to multiply the calories per serving by the number of servings you plan to eat. For example, the label on a package of bread might say that a serving size is 1 slice and that there are 90 calories in a serving. If you eat 1 slice, you will have eaten 90 calories. If you eat 2 slices, you will have eaten 180 calories.   How do I keep a food log? After each time that you eat, record the following in your food log as soon as possible:  What you ate. Be sure to include toppings, sauces, and other extras on the food.  How much you ate. This can be measured in cups, ounces, or number of items.  How many calories were in each food and drink.  The total number of calories in the food you ate. Keep your food log near you, such as in a pocket-sized notebook or on an app or website on your mobile phone. Some programs will calculate calories for you and show you how many calories you have left to meet your daily goal. What are some portion-control tips?  Know how many calories are in a serving. This will   help you know how many servings you can have of a certain food.  Use a measuring cup to measure serving sizes. You could also try weighing out portions on a kitchen scale. With time, you will be able to estimate serving sizes for some foods.  Take time to put servings of different foods on your favorite plates or in your favorite bowls and cups so you know what a serving looks like.  Try not to eat straight from a food's packaging, such as from a bag or box. Eating straight from the package makes it hard to see how much you are eating and can lead to overeating. Put the amount you would like to eat in a cup or on a plate to make sure you are eating the right portion.  Use smaller plates, glasses, and bowls for smaller  portions and to prevent overeating.  Try not to multitask. For example, avoid watching TV or using your computer while eating. If it is time to eat, sit down at a table and enjoy your food. This will help you recognize when you are full. It will also help you be more mindful of what and how much you are eating. What are tips for following this plan? Reading food labels  Check the calorie count compared with the serving size. The serving size may be smaller than what you are used to eating.  Check the source of the calories. Try to choose foods that are high in protein, fiber, and vitamins, and low in saturated fat, trans fat, and sodium. Shopping  Read nutrition labels while you shop. This will help you make healthy decisions about which foods to buy.  Pay attention to nutrition labels for low-fat or fat-free foods. These foods sometimes have the same number of calories or more calories than the full-fat versions. They also often have added sugar, starch, or salt to make up for flavor that was removed with the fat.  Make a grocery list of lower-calorie foods and stick to it. Cooking  Try to cook your favorite foods in a healthier way. For example, try baking instead of frying.  Use low-fat dairy products. Meal planning  Use more fruits and vegetables. One-half of your plate should be fruits and vegetables.  Include lean proteins, such as chicken, turkey, and fish. Lifestyle Each week, aim to do one of the following:  150 minutes of moderate exercise, such as walking.  75 minutes of vigorous exercise, such as running. General information  Know how many calories are in the foods you eat most often. This will help you calculate calorie counts faster.  Find a way of tracking calories that works for you. Get creative. Try different apps or programs if writing down calories does not work for you. What foods should I eat?  Eat nutritious foods. It is better to have a nutritious,  high-calorie food, such as an avocado, than a food with few nutrients, such as a bag of potato chips.  Use your calories on foods and drinks that will fill you up and will not leave you hungry soon after eating. ? Examples of foods that fill you up are nuts and nut butters, vegetables, lean proteins, and high-fiber foods such as whole grains. High-fiber foods are foods with more than 5 g of fiber per serving.  Pay attention to calories in drinks. Low-calorie drinks include water and unsweetened drinks. The items listed above may not be a complete list of foods and beverages you can eat.   Contact a dietitian for more information.   What foods should I limit? Limit foods or drinks that are not good sources of vitamins, minerals, or protein or that are high in unhealthy fats. These include:  Candy.  Other sweets.  Sodas, specialty coffee drinks, alcohol, and juice. The items listed above may not be a complete list of foods and beverages you should avoid. Contact a dietitian for more information. How do I count calories when eating out?  Pay attention to portions. Often, portions are much larger when eating out. Try these tips to keep portions smaller: ? Consider sharing a meal instead of getting your own. ? If you get your own meal, eat only half of it. Before you start eating, ask for a container and put half of your meal into it. ? When available, consider ordering smaller portions from the menu instead of full portions.  Pay attention to your food and drink choices. Knowing the way food is cooked and what is included with the meal can help you eat fewer calories. ? If calories are listed on the menu, choose the lower-calorie options. ? Choose dishes that include vegetables, fruits, whole grains, low-fat dairy products, and lean proteins. ? Choose items that are boiled, broiled, grilled, or steamed. Avoid items that are buttered, battered, fried, or served with cream sauce. Items labeled as  crispy are usually fried, unless stated otherwise. ? Choose water, low-fat milk, unsweetened iced tea, or other drinks without added sugar. If you want an alcoholic beverage, choose a lower-calorie option, such as a glass of wine or light beer. ? Ask for dressings, sauces, and syrups on the side. These are usually high in calories, so you should limit the amount you eat. ? If you want a salad, choose a garden salad and ask for grilled meats. Avoid extra toppings such as bacon, cheese, or fried items. Ask for the dressing on the side, or ask for olive oil and vinegar or lemon to use as dressing.  Estimate how many servings of a food you are given. Knowing serving sizes will help you be aware of how much food you are eating at restaurants. Where to find more information  Centers for Disease Control and Prevention: http://www.wolf.info/  [place] Department of Agriculture: http://www.wilson-mendoza.org/ Summary  Calorie counting means keeping track of how many calories you eat and drink each day. If you eat fewer calories than your body needs, you should lose weight.  A healthy amount of weight to lose per week is usually 1-2 lb (0.5-0.9 kg). This usually means reducing your daily calorie intake by 500-750 calories.  The number of calories in a food can be found on a Nutrition Facts label. If a food does not have a Nutrition Facts label, try to look up the calories online or ask your dietitian for help.  Use smaller plates, glasses, and bowls for smaller portions and to prevent overeating.  Use your calories on foods and drinks that will fill you up and not leave you hungry shortly after a meal. This information is not intended to replace advice given to you by your health care provider. Make sure you discuss any questions you have with your health care provider. Document Revised: 07/18/2019 Document Reviewed: 07/18/2019 Elsevier Patient Education  2021 [place]. Prediabetes Eating Plan Prediabetes is a condition that  causes blood sugar (glucose) levels to be higher than normal. This increases the risk for developing type 2 diabetes (type 2 diabetes mellitus). Working with a health care  provider or nutrition specialist (dietitian) to make diet and lifestyle changes can help prevent the onset of diabetes. These changes may help you:  Control your blood glucose levels.  Improve your cholesterol levels.  Manage your blood pressure. What are tips for following this plan? Reading food labels  Read food labels to check the amount of fat, salt (sodium), and sugar in prepackaged foods. Avoid foods that have: ? Saturated fats. ? Trans fats. ? Added sugars.  Avoid foods that have more than 300 milligrams (mg) of sodium per serving. Limit your sodium intake to less than 2,300 mg each day. Shopping  Avoid buying pre-made and processed foods.  Avoid buying drinks with added sugar. Cooking  Cook with olive oil. Do not use butter, lard, or ghee.  Bake, broil, grill, steam, or boil foods. Avoid frying. Meal planning  Work with your dietitian to create an eating plan that is right for you. This may include tracking how many calories you take in each day. Use a food diary, notebook, or mobile application to track what you eat at each meal.  Consider following a Mediterranean diet. This includes: ? Eating several servings of fresh fruits and vegetables each day. ? Eating fish at least twice a week. ? Eating one serving each day of whole grains, beans, nuts, and seeds. ? Using olive oil instead of other fats. ? Limiting alcohol. ? Limiting red meat. ? Using nonfat or low-fat dairy products.  Consider following a plant-based diet. This includes dietary choices that focus on eating mostly vegetables and fruit, grains, beans, nuts, and seeds.  If you have high blood pressure, you may need to limit your sodium intake or follow a diet such as the DASH (Dietary Approaches to Stop Hypertension) eating plan. The DASH  diet aims to lower high blood pressure.   Lifestyle  Set weight loss goals with help from your health care team. It is recommended that most people with prediabetes lose 7% of their body weight.  Exercise for at least 30 minutes 5 or more days a week.  Attend a support group or seek support from a mental health counselor.  Take over-the-counter and prescription medicines only as told by your health care provider. What foods are recommended? Fruits Berries. Bananas. Apples. Oranges. Grapes. Papaya. Mango. Pomegranate. Kiwi. Grapefruit. Cherries. Vegetables Lettuce. Spinach. Peas. Beets. Cauliflower. Cabbage. Broccoli. Carrots. Tomatoes. Squash. Eggplant. Herbs. Peppers. Onions. Cucumbers. Brussels sprouts. Grains Whole grains, such as whole-wheat or whole-grain breads, crackers, cereals, and pasta. Unsweetened oatmeal. Bulgur. Barley. Quinoa. Brown rice. Corn or whole-wheat flour tortillas or taco shells. Meats and other proteins Seafood. Poultry without skin. Lean cuts of pork and beef. Tofu. Eggs. Nuts. Beans. Dairy Low-fat or fat-free dairy products, such as yogurt, cottage cheese, and cheese. Beverages Water. Tea. Coffee. Sugar-free or diet soda. Seltzer water. Low-fat or nonfat milk. Milk alternatives, such as soy or almond milk. Fats and oils Olive oil. Canola oil. Sunflower oil. Grapeseed oil. Avocado. Walnuts. Sweets and desserts Sugar-free or low-fat pudding. Sugar-free or low-fat ice cream and other frozen treats. Seasonings and condiments Herbs. Sodium-free spices. Mustard. Relish. Low-salt, low-sugar ketchup. Low-salt, low-sugar barbecue sauce. Low-fat or fat-free mayonnaise. The items listed above may not be a complete list of recommended foods and beverages. Contact a dietitian for more information. What foods are not recommended? Fruits Fruits canned with syrup. Vegetables Canned vegetables. Frozen vegetables with butter or cream sauce. Grains Refined white flour and  flour products, such as bread, pasta, snack foods, and  cereals. Meats and other proteins Fatty cuts of meat. Poultry with skin. Breaded or fried meat. Processed meats. Dairy Full-fat yogurt, cheese, or milk. Beverages Sweetened drinks, such as iced tea and soda. Fats and oils Butter. Lard. Ghee. Sweets and desserts Baked goods, such as cake, cupcakes, pastries, cookies, and cheesecake. Seasonings and condiments Spice mixes with added salt. Ketchup. Barbecue sauce. Mayonnaise. The items listed above may not be a complete list of foods and beverages that are not recommended. Contact a dietitian for more information. Where to find more information  American Diabetes Association: www.diabetes.org Summary  You may need to make diet and lifestyle changes to help prevent the onset of diabetes. These changes can help you control blood sugar, improve cholesterol levels, and manage blood pressure.  Set weight loss goals with help from your health care team. It is recommended that most people with prediabetes lose 7% of their body weight.  Consider following a Mediterranean diet. This includes eating plenty of fresh fruits and vegetables, whole grains, beans, nuts, seeds, fish, and low-fat dairy, and using olive oil instead of other fats. This information is not intended to replace advice given to you by your health care provider. Make sure you discuss any questions you have with your health care provider. Document Revised: 09/05/2019 Document Reviewed: 09/05/2019 Elsevier Patient Education  2021 [place]. Preventing Hypertension Hypertension, also called high blood pressure, is when the force of blood pumping through the arteries is too strong. Arteries are blood vessels that carry blood from the heart throughout the body. Often, hypertension does not cause symptoms until blood pressure is very high. It is important to have your blood pressure checked regularly. Diet and lifestyle changes can  help you prevent hypertension, and they may make you feel better overall and improve your quality of life. If you already have hypertension, you may control it with diet and lifestyle changes, as well as with medicine. How can this condition affect me? Over time, hypertension can damage the arteries and decrease blood flow to important parts of the body, including the brain, heart, and kidneys. By keeping your blood pressure in a healthy range, you can help prevent complications like heart attack, heart failure, stroke, kidney failure, and vascular dementia. What can increase my risk?  Being an older adult. Older people are more often affected.  Having family members who have had high blood pressure.  Being obese.  Being female. Males are more likely to have high blood pressure.  Drinking too much alcohol or caffeine.  Smoking or using illegal drugs.  Taking certain medicines, such as antidepressants, decongestants, birth control pills, and NSAIDs, such as ibuprofen.  Having thyroid problems.  Having certain tumors. What actions can I take to prevent or manage this condition? Work with your health care provider to make a hypertension prevention plan that works for you. Follow your plan and keep all follow-up visits as told by your health care provider. Diet changes Maintain a healthy diet. This includes:  Eating less salt (sodium). Ask your health care provider how much sodium is safe for you to have. The general recommendation is to have less than 1 tsp (2,300 mg) of sodium a day. ? Do not add salt to your food. ? Choose low-sodium options when grocery shopping and eating out.  Limiting fats in your diet. You can do this by eating low-fat or fat-free dairy products and by eating less red meat.  Eating more fruits, vegetables, and whole grains. Make a goal to  eat: ? 1-2 cups of fresh fruits and vegetables each day. ? 3-4 servings of whole grains each day.  Avoiding foods and  beverages that have added sugars.  Eating fish that contain healthy fats (omega-3 fatty acids), such as mackerel or salmon. If you need help putting together a healthy eating plan, try the DASH diet. This diet is high in fruits, vegetables, and whole grains. It is low in sodium, red meat, and added sugars. DASH stands for Dietary Approaches to Stop Hypertension.   Lifestyle changes Lose weight if you are overweight. Losing just 3?5% of your body weight can help prevent or control hypertension. For example, if your present weight is 200 lb (91 kg), a loss of 3-5% of your weight means losing 6-10 lb (2.7-4.5 kg). Ask your health care provider to help you with a diet and exercise plan to safely lose weight. Other recommendations usually include:  Get enough exercise. Do at least 150 minutes of moderate-intensity exercise each week. You could do this in short exercise sessions several times a day, or you could do longer exercise sessions a few times a week. For example, you could take a brisk 10-minute walk or bike ride, 3 times a day, for 5 days a week.  Find ways to reduce stress, such as exercising, meditating, listening to music, or taking a yoga class. If you need help reducing stress, ask your health care provider.  Do not use any products that contain nicotine or tobacco, such as cigarettes, e-cigarettes, and chewing tobacco. If you need help quitting, ask your health care provider. Chemicals in tobacco and nicotine products raise your blood pressure each time you use them. If you need help quitting, ask your health care provider.  Learn how to check your blood pressure at home. Make sure that you know your personal target blood pressure, as told by your health care provider.  Try to sleep 7-9 hours per night.   Alcohol use  Do not drink alcohol if: ? Your health care provider tells you not to drink. ? You are pregnant, may be pregnant, or are planning to become pregnant.  If you drink  alcohol: ? Limit how much you use to:  0-1 drink a day for women.  0-2 drinks a day for men. ? Be aware of how much alcohol is in your drink. In the [place] one drink equals one 12 oz bottle of beer (355 mL), one 5 oz glass of wine (148 mL), or one 1 oz glass of hard liquor (44 mL). Medicines In addition to diet and lifestyle changes, your health care provider may recommend medicines to help lower your blood pressure. In general:  You may need to try a few different medicines to find what works best for you.  You may need to take more than one medicine.  Take over-the-counter and prescription medicines only as told by your health care provider. Questions to ask your health care provider  What is my blood pressure goal?  How can I lower my risk for high blood pressure?  How should I monitor my blood pressure at home? Where to find support Your health care provider can help you prevent hypertension and help you keep your blood pressure at a healthy level. Your local hospital or your community may also provide support services and prevention programs. The American Heart Association offers an online support network at supportnetwork.heart.org Where to find more information Learn more about hypertension from:  [place], Lung, and [place]: https://wilson-eaton.com/  Centers for Disease Control and Prevention: http://www.wolf.info/  American Academy of Family Physicians: familydoctor.org Learn more about the DASH diet from:  [place], Lung, and [place]: https://wilson-eaton.com/ Contact a health care provider if:  You think you are having a reaction to medicines you have taken.  You have recurrent headaches or feel dizzy.  You have swelling in your ankles.  You have trouble with your vision. Get help right away if:  You have sudden, severe chest, back, or abdominal pain or discomfort.  You have shortness of breath.  You have a sudden, severe headache. These symptoms may  represent a serious problem that is an emergency. Do not wait to see if the symptoms will go away. Get medical help right away. Call your local emergency services (911 in the [place] Do not drive yourself to the hospital.  Summary  Hypertension often does not cause any symptoms until blood pressure is very high. It is important to get your blood pressure checked regularly.  Diet and lifestyle changes are important steps in preventing hypertension.  By keeping your blood pressure in a healthy range, you may prevent complications like heart attack, heart failure, stroke, and kidney failure.  Work with your health care provider to make a hypertension prevention plan that works for you. This information is not intended to replace advice given to you by your health care provider. Make sure you discuss any questions you have with your health care provider. Document Revised: 05/07/2019 Document Reviewed: 05/07/2019 Elsevier Patient Education  2021 [employer].

## 2020-10-29 NOTE — Progress Notes (Signed)
[facility]  [name] is a 55 y.o. female presents to office today for annual physical exam examination.    Concerns today include: 1. Medication refills   Occupation: Between jobs, Marital status: S, Substance use: No Diet: low sodium and carb diet , Exercise:none " needs to start"  Last eye exam: 11/21 Last dental exam: 11/21 Last colonoscopy: 06/18/20 Last mammogram: due  Last pap smear: 12/21 Refills needed today: yes Immunizations needed: Flu Vaccine: yes  Tdap Vaccine: given today 10/29/2020  - every 62yrs - (<3 lifetime doses or unknown): all wounds -- look up need for Tetanus IG - (>=3 lifetime doses): clean/minor wound if >38yrs from previous; all other wounds if >52yrs from previous Zoster Vaccine: no (those >50yo, once) Pneumonia Vaccine: no (those w/ risk factors) - (<31yr) Both: Immunocompromised, cochlear implant, CSF leak, asplenic, sickle cell, Chronic Renal Failure - (<4yr) PPSV-23 only: Heart dz, lung disease, DM, tobacco abuse, alcoholism, cirrhosis/liver disease. - (>34yr): PPSV13 then PPSV23 in 6-12mths;  - (>45yr): repeat PPSV23 once if pt received prior to 55yo and 63yrs have passed  Past Medical History:  Diagnosis Date  . ADD (attention deficit disorder)   . Allergy   . Anemia   . Arthritis    OA back   . Cyst of right ovary   . Depression   . Heart murmur   . History of kidney stones   . Hypertension   . Neuromuscular disorder (HCC)    RLS  . Restless leg syndrome   . Seizures ([place])    in her 47's- none since-? etiology    Social History   Socioeconomic History  . Marital status: Single    Spouse name: Not on file  . Number of children: Not on file  . Years of education: Not on file  . Highest education level: Not on file  Occupational History  . Not on file  Tobacco Use  . Smoking status: Former Research scientist (life sciences)  . Smokeless tobacco: Never Used  . Tobacco comment: on and off in the past, quit in 2002  Vaping Use   . Vaping Use: Never used  Substance and Sexual Activity  . Alcohol use: Yes    Comment: occ  . Drug use: Never  . Sexual activity: Not on file  Other Topics Concern  . Not on file  Social History Narrative   Work or School: Biochemist, clinical - [place] Situation: none      Spiritual Beliefs: Christian      Lifestyle: no regular exercise; so so            Social Determinants of Radio broadcast assistant Strain: Not on file  Food Insecurity: Not on file  Transportation Needs: Not on file  Physical Activity: Not on file  Stress: Not on file  Social Connections: Not on file  Intimate Partner Violence: Not on file   Past Surgical History:  Procedure Laterality Date  . APPENDECTOMY  2008  . BUBBLE STUDY  09/18/2020   Procedure: BUBBLE STUDY;  Surgeon: Fay Records, MD;  Location: [place];  Service: Cardiovascular;;  . Lillard Anes  2009   x 2  . TEE WITHOUT CARDIOVERSION N/A 09/18/2020   Procedure: TRANSESOPHAGEAL ECHOCARDIOGRAM (TEE);  Surgeon: Fay Records, MD;  Location: [facility] ENDOSCOPY;  Service: Cardiovascular;  Laterality: N/A;  . WISDOM TOOTH EXTRACTION     Family History  Problem Relation Age of Onset  . Colon cancer Father 15  .  Colon cancer Mother 25  . Heart attack Mother   . Dementia Mother   . Colon polyps Neg Hx   . Esophageal cancer Neg Hx   . Stomach cancer Neg Hx   . Rectal cancer Neg Hx     Current Outpatient Medications:  .  aspirin 81 MG EC tablet, TAKE 1 TABLET (81 MG TOTAL) BY MOUTH DAILY. SWALLOW WHOLE. (Patient taking differently: Take by mouth 5 (five) times daily. SWALLOW WHOLE.), Disp: 30 tablet, Rfl: 11 .  atorvastatin (LIPITOR) 80 MG tablet, Take 1 tablet (80 mg total) by mouth daily at 6 PM., Disp: 90 tablet, Rfl: 1 .  diphenhydrAMINE (BENADRYL) 25 mg capsule, Take 25 mg by mouth every 6 (six) hours as needed., Disp: , Rfl:  .  escitalopram (LEXAPRO) 10 MG tablet, TAKE 1 TABLET (10 MG TOTAL) BY MOUTH DAILY., Disp: 90 tablet, Rfl:  1 .  gabapentin (NEURONTIN) 300 MG capsule, Take 1 capsule (300 mg total) by mouth 3 (three) times daily., Disp: 90 capsule, Rfl: 5 .  losartan (COZAAR) 25 MG tablet, Take 1 tablet (25 mg total) by mouth daily., Disp: 90 tablet, Rfl: 1 .  Melatonin 3 MG TABS, Take 3 mg by mouth at bedtime as needed (sleep)., Disp: , Rfl:  .  Multiple Vitamin (MULTIVITAMIN) tablet, Take 1 tablet by mouth daily., Disp: , Rfl:  .  valACYclovir (VALTREX) 500 MG tablet, Take 500 mg by mouth daily., Disp: , Rfl: 4  Allergies  Allergen Reactions  . Ciprofloxacin Swelling  . Levaquin [Levofloxacin] Anaphylaxis    Hives and throat swelling   . Amoxil [Amoxicillin] Rash    Extreme itching limbs and trunk   . Dynacin [Minocycline] Rash  . Sulfamethazine Rash     ROS: Review of Systems Review of Systems  Musculoskeletal: Positive for falls.       4/22 right knee and twist right foot plantar calcaneal spur  Neurological: Positive for headaches.       Managed by neurology   Psychiatric/Behavioral: Positive for depression.       Improving with medication  All other systems reviewed and are negative.   Physical exam Vitals:   10/29/20 1454  BP: 134/81  Pulse: 66  Temp: (!) 97.3 F (36.3 C)  TempSrc: Temporal  SpO2: 96%  Weight: 219 lb 9.6 oz (99.6 kg)  Height: 5\' 6"  (1.676 m)   General: Vital signs reviewed.  Patient is well-developed and well-nourished morbid obesity  in no acute distress and cooperative with exam.  Head: Normocephalic and atraumatic. Eyes: EOMI, conjunctivae normal, no scleral icterus.  Neck: Supple, trachea midline, normal ROM, no JVD, masses, thyromegaly, or carotid bruit present.  Cardiovascular: RRR, S1 normal, S2 normal, no murmurs, gallops, or rubs. Pulmonary/Chest: Clear to auscultation bilaterally, no wheezes, rales, or rhonchi. Abdominal: Soft, non-tender, non-distended, BS +, no masses, organomegaly, or guarding present.  Musculoskeletal: No joint deformities or  erythema. Right knee pain and ankle with stiffness Extremities: No lower extremity edema bilaterally,  pulses symmetric and intact bilaterally. No cyanosis or clubbing. Neurological: A&O x3, Strength is normal and symmetric bilaterally, cranial nerve II-XII are grossly intact, no focal motor deficit, sensory intact to light touch bilaterally.  Skin: Warm, dry and intact. No rashes or erythema. Psychiatric: Normal mood and affect. speech and behavior is normal. Cognition and memory are normal.  Starlee was seen today for wellness visit.  Diagnoses and all orders for this visit:  Need for Tdap vaccination -     Tdap vaccine  greater than or equal to 7yo IM  Annual physical exam Completed  Encounter for screening mammogram for malignant neoplasm of breast Patient completed application for BCCP while in clinic and application has and faxed to [facility]. Patient aware that [facility] will contact her directly to schedule appointment.  Benign essential hypertension Counseled on blood pressure goal of less than 130/80, low-sodium, DASH diet, medication compliance, 150 minutes of moderate intensity exercise per week. Discussed medication compliance, adverse effects.   Moderate recurrent major depression (HCC) Currently on escitalopram 10 mg daily which she feels has helped and dosage is appropriate.  The underlying cause of depression was a stroke March 2022-she is followed by neurology for CVA    Assessment/ Plan: Juliane Poot here for annual physical exam.   No problem-specific Assessment & Plan notes found for this encounter.   Counseled on healthy lifestyle choices, including diet (rich in fruits, vegetables and lean meats and low in salt and simple carbohydrates) and exercise (at least 30 minutes of moderate physical activity daily).  Patient to follow up in 1 year for annual exam or sooner if needed.  The above assessment and management plan was discussed with the patient. The patient  verbalized understanding of and has agreed to the management plan. Patient is aware to call the clinic if symptoms persist or worsen. Patient is aware when to return to the clinic for a follow-up visit. Patient educated on when it is appropriate to go to the emergency department.   Juluis Mire NP-C [address] [place] [phone]

## 2020-10-30 ENCOUNTER — Ambulatory Visit: Payer: Self-pay | Attending: Primary Care

## 2020-11-04 ENCOUNTER — Other Ambulatory Visit: Payer: Self-pay | Admitting: *Deleted

## 2020-11-04 NOTE — Patient Outreach (Signed)
[place] [facility]) Care Management  11/04/2020  Sarah Simpson Jul 11, 1965 [id number]   [facility] Unsuccessful outreach  Sarah Simpson was referred to [facility] on 09/23/20 for EMMI stroke red alerts after a discharge from [facility]  She is not on the APL  RED ON [place] Day# 13 Date Friday October 02 2020 [place] Reason: Had follow-up appointment?No And Day #3Date:Tuesday 09/22/20 1301 Red Alert Reason: Questions/problems with meds? Yes  Insurance:pending medicaid [facility] admissions x1ED visits x 1in the last 6 months [place] discharge     Last successful outreach was on 10/02/20 when the above red alert reasons were reviewed and resolved   Outreach attempt to the home number5/2/22, 10/27/20 & 11/04/20 unsuccessful No answer. [facility] RN CM left HIPAA [facility] Portability and Accountability Act) compliant voicemail message along with CM's contact info.   Plan: [facility] RN CM scheduled this patient for pending case closure per [facility] workflow  [name] L. Lavina Hamman, RN, BSN, [place] Coordinator Office number [phone] Mobile number [phone]

## 2020-11-05 ENCOUNTER — Encounter: Payer: Self-pay | Admitting: Adult Health

## 2020-11-05 ENCOUNTER — Other Ambulatory Visit: Payer: Self-pay

## 2020-11-09 ENCOUNTER — Other Ambulatory Visit: Payer: Self-pay

## 2020-11-09 MED ORDER — GABAPENTIN 300 MG PO CAPS
ORAL_CAPSULE | ORAL | 5 refills | Status: DC
Start: 2020-11-09 — End: 2020-12-14
  Filled 2020-11-09: qty 120, 30d supply, fill #0
  Filled 2020-11-11: qty 360, 90d supply, fill #0

## 2020-11-10 ENCOUNTER — Telehealth: Payer: Self-pay | Admitting: Primary Care

## 2020-11-10 NOTE — Telephone Encounter (Signed)
I spoke with Pt friend, he was inform that the Pt need to call the [employer]. To talk to them about this issue and also was inform that the Pt is the one need to talk

## 2020-11-10 NOTE — Telephone Encounter (Signed)
Copied from [place] [phone]. Topic: General - Other >> Nov 09, 2020  9:32 AM Alanda Slim E wrote: Reason for CRM: Danna Hefty called on pts behalf to speak with Clifton James about the financial decision letter they received / he stated the letter doesn't make sense / Pt only got 50% but everything he looked at shows she should have qualified for 100% / she got 100% with the blue card / please advise

## 2020-11-11 ENCOUNTER — Other Ambulatory Visit: Payer: Self-pay

## 2020-11-26 ENCOUNTER — Other Ambulatory Visit: Payer: Self-pay | Admitting: *Deleted

## 2020-11-26 ENCOUNTER — Ambulatory Visit (INDEPENDENT_AMBULATORY_CARE_PROVIDER_SITE_OTHER): Payer: Self-pay | Admitting: Primary Care

## 2020-11-26 NOTE — Patient Outreach (Signed)
[place] [facility]) Care Management  11/26/2020  Sarah Simpson Feb 05, 1966 [id number]   [facility] Case closure   Sarah Simpson was referred to [facility] on 09/23/20 for EMMI stroke red alerts after a discharge from [facility]  Insurance: pending medicaid [facility] admissions x 1  ED visits x 1 in the last 6 months   Last successful outreach was on 10/02/20 when the above red alert reasons were reviewed and resolved  Outreach attempt to the home number 10/19/20, 10/27/20 & 11/04/20 unsuccessful Unsuccessful outreach letter sent on 11/04/20 without a response   Plan [facility] RN CM will close case after no response from patient within 10 business days. Unable to reach Case closure letters sent to patient and MD  Joelene Millin L. Lavina Hamman, RN, BSN, [place] Coordinator Office number [phone] Mobile number [phone]  Main [facility] number [phone] Fax number [phone]

## 2020-11-28 ENCOUNTER — Emergency Department (HOSPITAL_COMMUNITY): Payer: Self-pay

## 2020-11-28 ENCOUNTER — Inpatient Hospital Stay (HOSPITAL_COMMUNITY)
Admission: EM | Admit: 2020-11-28 | Discharge: 2020-11-30 | DRG: 069 | Disposition: A | Discharge status: Home / Self Care | Payer: Self-pay | Attending: Neurology | Admitting: Neurology

## 2020-11-28 DIAGNOSIS — G459 Transient cerebral ischemic attack, unspecified: Principal | ICD-10-CM | POA: Diagnosis present

## 2020-11-28 DIAGNOSIS — Z8249 Family history of ischemic heart disease and other diseases of the circulatory system: Secondary | ICD-10-CM

## 2020-11-28 DIAGNOSIS — Z800 Family history of malignant neoplasm of digestive organs: Secondary | ICD-10-CM

## 2020-11-28 DIAGNOSIS — R29810 Facial weakness: Secondary | ICD-10-CM | POA: Diagnosis present

## 2020-11-28 DIAGNOSIS — R531 Weakness: Secondary | ICD-10-CM | POA: Diagnosis present

## 2020-11-28 DIAGNOSIS — R200 Anesthesia of skin: Secondary | ICD-10-CM

## 2020-11-28 DIAGNOSIS — I10 Essential (primary) hypertension: Secondary | ICD-10-CM | POA: Diagnosis present

## 2020-11-28 DIAGNOSIS — Z20822 Contact with and (suspected) exposure to covid-19: Secondary | ICD-10-CM | POA: Diagnosis present

## 2020-11-28 DIAGNOSIS — Z8673 Personal history of transient ischemic attack (TIA), and cerebral infarction without residual deficits: Secondary | ICD-10-CM

## 2020-11-28 DIAGNOSIS — I639 Cerebral infarction, unspecified: Secondary | ICD-10-CM | POA: Diagnosis present

## 2020-11-28 DIAGNOSIS — R569 Unspecified convulsions: Secondary | ICD-10-CM | POA: Diagnosis present

## 2020-11-28 DIAGNOSIS — F32A Depression, unspecified: Secondary | ICD-10-CM | POA: Diagnosis present

## 2020-11-28 DIAGNOSIS — G2581 Restless legs syndrome: Secondary | ICD-10-CM | POA: Diagnosis present

## 2020-11-28 DIAGNOSIS — R471 Dysarthria and anarthria: Secondary | ICD-10-CM | POA: Diagnosis present

## 2020-11-28 LAB — COMPREHENSIVE METABOLIC PANEL
ALT: 31 U/L (ref 0–44)
AST: 26 U/L (ref 15–41)
Albumin: 3.9 g/dL (ref 3.5–5.0)
Alkaline Phosphatase: 52 U/L (ref 38–126)
Anion gap: 10 (ref 5–15)
BUN: 16 mg/dL (ref 6–20)
CO2: 24 mmol/L (ref 22–32)
Calcium: 9.2 mg/dL (ref 8.9–10.3)
Chloride: 104 mmol/L (ref 98–111)
Creatinine, Ser: 0.68 mg/dL (ref 0.44–1.00)
GFR, Estimated: >60 mL/min (ref >60)
Glucose, Bld: 153 mg/dL — ABNORMAL HIGH (ref 70–99)
Potassium: 3.7 mmol/L (ref 3.5–5.1)
Sodium: 138 mmol/L (ref 135–145)
Total Bilirubin: 0.9 mg/dL (ref 0.3–1.2)
Total Protein: 6.7 g/dL (ref 6.5–8.1)

## 2020-11-28 LAB — I-STAT BETA HCG BLOOD, ED (MC, WL, AP ONLY): I-stat hCG, quantitative: <5 m[IU]/mL (ref <5)

## 2020-11-28 LAB — DIFFERENTIAL
Abs Immature Granulocytes: 0.01 10*3/uL (ref 0.00–0.07)
Basophils Absolute: 0.1 10*3/uL (ref 0.0–0.1)
Basophils Relative: 1 %
Eosinophils Absolute: 0.4 10*3/uL (ref 0.0–0.5)
Eosinophils Relative: 5 %
Immature Granulocytes: 0 %
Lymphocytes Relative: 36 %
Lymphs Abs: 2.6 10*3/uL (ref 0.7–4.0)
Monocytes Absolute: 0.6 10*3/uL (ref 0.1–1.0)
Monocytes Relative: 8 %
Neutro Abs: 3.6 10*3/uL (ref 1.7–7.7)
Neutrophils Relative %: 50 %

## 2020-11-28 LAB — CBC
HCT: 42.2 % (ref 36.0–46.0)
Hemoglobin: 14.1 g/dL (ref 12.0–15.0)
MCH: 31.1 pg (ref 26.0–34.0)
MCHC: 33.4 g/dL (ref 30.0–36.0)
MCV: 93 fL (ref 80.0–100.0)
Platelets: 238 10*3/uL (ref 150–400)
RBC: 4.54 MIL/uL (ref 3.87–5.11)
RDW: 12.2 % (ref 11.5–15.5)
WBC: 7.2 10*3/uL (ref 4.0–10.5)
nRBC: 0 % (ref 0.0–0.2)

## 2020-11-28 LAB — I-STAT CHEM 8, ED
BUN: 19 mg/dL (ref 6–20)
Calcium, Ion: 1.08 mmol/L — ABNORMAL LOW (ref 1.15–1.40)
Chloride: 104 mmol/L (ref 98–111)
Creatinine, Ser: 0.6 mg/dL (ref 0.44–1.00)
Glucose, Bld: 150 mg/dL — ABNORMAL HIGH (ref 70–99)
HCT: 42 % (ref 36.0–46.0)
Hemoglobin: 14.3 g/dL (ref 12.0–15.0)
Potassium: 3.7 mmol/L (ref 3.5–5.1)
Sodium: 141 mmol/L (ref 135–145)
TCO2: 25 mmol/L (ref 22–32)

## 2020-11-28 LAB — APTT: aPTT: 26 seconds (ref 24–36)

## 2020-11-28 LAB — PROTIME-INR
INR: 1 (ref 0.8–1.2)
Prothrombin Time: 13.1 seconds (ref 11.4–15.2)

## 2020-11-28 MED ORDER — IOHEXOL 350 MG/ML SOLN
50.0000 mL | Freq: Once | INTRAVENOUS | Status: AC | PRN
  Administered 2020-11-28: 50 mL via INTRAVENOUS

## 2020-11-28 MED ORDER — ALTEPLASE (STROKE) FULL DOSE INFUSION
0.9000 mg/kg | Freq: Once | INTRAVENOUS | Status: AC
  Administered 2020-11-28: 88.8 mg via INTRAVENOUS
  Filled 2020-11-28: qty 100

## 2020-11-28 MED ORDER — SODIUM CHLORIDE 0.9 % IV SOLN
50.0000 mL | Freq: Once | INTRAVENOUS | Status: AC
  Administered 2020-11-29: 50 mL via INTRAVENOUS

## 2020-11-28 MED ORDER — SODIUM CHLORIDE 0.9% FLUSH
3.0000 mL | Freq: Once | INTRAVENOUS | Status: AC
  Administered 2020-11-29: 3 mL via INTRAVENOUS

## 2020-11-28 NOTE — ED Provider Notes (Addendum)
[place] EMERGENCY DEPARTMENT Provider Note   CSN: [id number] Arrival date & time: 11/28/20  2313     History No chief complaint on file.   Sarah Simpson is a 55 y.o. female.  Patient is a 55 year old female with past medical history of prior stroke, hypertension, anemia.  Patient brought by EMS as a code stroke for evaluation of right facial droop, right arm numbness, and slurred speech.  This began at approximately 10:10 this evening while returning home from a catering event.  Patient denies other complaints.  She denies any aggravating or alleviating factors.  The history is provided by the patient.      Past Medical History:  Diagnosis Date   ADD (attention deficit disorder)    Allergy    Anemia    Arthritis    OA back    Cyst of right ovary    Depression    Heart murmur    History of kidney stones    Hypertension    Neuromuscular disorder (HCC)    RLS   Restless leg syndrome    Seizures (HCC)    in her 20's- none since-? etiology     Patient Active Problem List   Diagnosis Date Noted   Family history of malignant neoplasm of gastrointestinal tract 10/02/2020   Genital herpes simplex 10/02/2020   Menopause 10/02/2020   Moderate recurrent major depression ([place]) 10/02/2020   New daily persistent headache 10/02/2020   Obesity 10/02/2020   Prediabetes 10/02/2020   Stroke (cerebrum) ([place]) 09/16/2020   Acute CVA (cerebrovascular accident) ([place]) 09/15/2020   Benign essential hypertension 09/15/2020    Past Surgical History:  Procedure Laterality Date   APPENDECTOMY  2008   BUBBLE STUDY  09/18/2020   Procedure: BUBBLE STUDY;  Surgeon: Fay Records, MD;  Location: [place];  Service: Cardiovascular;;   BUNIONECTOMY  2009   x 2   TEE WITHOUT CARDIOVERSION N/A 09/18/2020   Procedure: TRANSESOPHAGEAL ECHOCARDIOGRAM (TEE);  Surgeon: Fay Records, MD;  Location: [facility] ENDOSCOPY;  Service: Cardiovascular;  Laterality: N/A;   WISDOM TOOTH  EXTRACTION       OB History   No obstetric history on file.     Family History  Problem Relation Age of Onset   Colon cancer Father 58   Colon cancer Mother 77   Heart attack Mother    Dementia Mother    Colon polyps Neg Hx    Esophageal cancer Neg Hx    Stomach cancer Neg Hx    Rectal cancer Neg Hx     Social History   Tobacco Use   Smoking status: Former    Pack years: 0.00   Smokeless tobacco: Never   Tobacco comments:    on and off in the past, quit in 2002  Vaping Use   Vaping Use: Never used  Substance Use Topics   Alcohol use: Yes    Comment: occ   Drug use: Never    Home Medications Prior to Admission medications   Medication Sig Start Date End Date Taking? Authorizing Provider  aspirin 81 MG EC tablet TAKE 1 TABLET (81 MG TOTAL) BY MOUTH DAILY. SWALLOW WHOLE. Patient taking differently: Take by mouth 5 (five) times daily. SWALLOW WHOLE. 09/18/20 09/18/21  Kathie Dike, MD  atorvastatin (LIPITOR) 80 MG tablet Take 1 tablet (80 mg total) by mouth daily at 6 PM. 10/29/20   Kerin Perna, NP  diphenhydrAMINE (BENADRYL) 25 mg capsule Take 25 mg by mouth every 6 (  six) hours as needed.    [provider]  escitalopram (LEXAPRO) 10 MG tablet TAKE 1 TABLET (10 MG TOTAL) BY MOUTH DAILY. 10/29/20 10/29/21  Kerin Perna, NP  gabapentin (NEURONTIN) 300 MG capsule Take 1 capsule by mouth in the morning, 1 capsule in the afternoon and 2 capsules in the evening. 11/09/20   [name], NP  losartan (COZAAR) 25 MG tablet Take 1 tablet (25 mg total) by mouth daily. 10/29/20   Kerin Perna, NP  Melatonin 3 MG TABS Take 3 mg by mouth at bedtime as needed (sleep).    [provider]  Multiple Vitamin (MULTIVITAMIN) tablet Take 1 tablet by mouth daily.    [provider]  valACYclovir (VALTREX) 500 MG tablet Take 500 mg by mouth daily. 04/05/17   [provider]    Allergies    Ciprofloxacin, Levaquin [levofloxacin], Amoxil  [amoxicillin], Dynacin [minocycline], and Sulfamethazine  Review of Systems   Review of Systems  All other systems reviewed and are negative.  Physical Exam Updated Vital Signs Ht 5\' 6"  (1.676 m)   Wt 98.7 kg   BMI 35.11 kg/m   Physical Exam Vitals and nursing note reviewed.  Constitutional:      General: She is not in acute distress.    Appearance: She is well-developed. She is not diaphoretic.  HENT:     Head: Normocephalic and atraumatic.  Cardiovascular:     Rate and Rhythm: Normal rate and regular rhythm.     Heart sounds: No murmur heard.   No friction rub. No gallop.  Pulmonary:     Effort: Pulmonary effort is normal. No respiratory distress.     Breath sounds: Normal breath sounds. No wheezing.  Abdominal:     General: Bowel sounds are normal. There is no distension.     Palpations: Abdomen is soft.     Tenderness: There is no abdominal tenderness.  Musculoskeletal:        General: Normal range of motion.     Cervical back: Normal range of motion and neck supple.  Skin:    General: Skin is warm and dry.  Neurological:     Mental Status: She is alert and oriented to person, place, and time. Mental status is at baseline.     Cranial Nerves: Cranial nerve deficit present.     Motor: Weakness present.     Coordination: Coordination normal.     Comments: There is a right-sided facial droop noted.  She also has weakness and decreased sensation of the right arm.    ED Results / Procedures / Treatments   Labs (all labs ordered are listed, but only abnormal results are displayed) Labs Reviewed  I-STAT CHEM 8, ED - Abnormal; Notable for the following components:      Result Value   Glucose, Bld 150 (*)    Calcium, Ion 1.08 (*)    All other components within normal limits  PROTIME-INR  APTT  CBC  DIFFERENTIAL  COMPREHENSIVE METABOLIC PANEL  I-STAT BETA HCG BLOOD, ED (MC, [facility], AP ONLY)  CBG MONITORING, ED    EKG EKG Interpretation  Date/Time:  Sunday November 29 2020 00:01:37 EDT Ventricular Rate:  72 PR Interval:  204 QRS Duration: 99 QT Interval:  439 QTC Calculation: 481 R Axis:   55 Text Interpretation: Sinus rhythm Borderline prolonged PR interval Confirmed by Veryl Speak [phone]) on 11/29/2020 12:03:32 AM  Radiology No results found.  Procedures Procedures   Medications Ordered in ED Medications  sodium chloride flush (NS) 0.9 % injection 3 mL (has no administration in time range)  alteplase (ACTIVASE) 1 mg/mL infusion 88.8 mg (88.8 mg Intravenous New Bag/Given 11/28/20 2333)    Followed by  0.9 %  sodium chloride infusion (has no administration in time range)  iohexol (OMNIPAQUE) 350 MG/ML injection 50 mL (50 mLs Intravenous Contrast Given 11/28/20 2347)    ED Course  I have reviewed the triage vital signs and the nursing notes.  Pertinent labs & imaging results that were available during my care of the patient were reviewed by me and considered in my medical decision making (see chart for details).    MDM Rules/Calculators/A&P  Patient arriving here as a code stroke with deficits as described in the HPI.  She was screened upon arrival, then sent to radiology for stat CT scan of the head.  Patient seen in radiology by neurology who has decided to proceed with tPA.  Patient to be admitted to the neurology service for further observation and care.  CRITICAL CARE Performed by: Veryl Speak Total critical care time: 35 minutes Critical care time was exclusive of separately billable procedures and treating other patients. Critical care was necessary to treat or prevent imminent or life-threatening deterioration. Critical care was time spent personally by me on the following activities: development of treatment plan with patient and/or surrogate as well as nursing, discussions with consultants, evaluation of patient's response to treatment, examination of patient, obtaining history from patient or surrogate, ordering and performing  treatments and interventions, ordering and review of laboratory studies, ordering and review of radiographic studies, pulse oximetry and re-evaluation of patient's condition.   Final Clinical Impression(s) / ED Diagnoses Final diagnoses:  None    Rx / DC Orders ED Discharge Orders     None        Veryl Speak, MD 11/28/20 [place]    Veryl Speak, MD 11/29/20 0003

## 2020-11-28 NOTE — Progress Notes (Signed)
Pharmacist Code Stroke Response  Notified to mix tPA at 2327 by Dr. Lorrin Goodell Delivered tPA to RN at 2330  tPA dose = 8.9mg  bolus over 1 minute followed by 79.9mg  for a total dose of 88.8mg  over 1 hour  Issues/delays encountered (if applicable): None.  Rogue Bussing 11/28/20 11:35 PM

## 2020-11-28 NOTE — H&P (Addendum)
NEUROLOGY CONSULTATION NOTE   Date of service: November 28, 2020 Patient Name: Sarah Simpson MRN:  [id number] DOB:  1965-11-26 Reason for consult: "Stroke code" Requesting Provider: Veryl Speak, MD _ _ _   _ __   _ __ _ _  __ __   _ __   __ _  History of Present Illness  Colletta Spillers is a 55 y.o. 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  Today, she was brought in for concerns for R sided weakness and aphasia. Slowed thought process on my assess with no aphasia but does have R hand grip weakness which per patient is new along with Facial droop and dysarthria.  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 Just got done with a job and wrapped up all the stuff and driving back when her R hand just froze and she could not get it to move along with R hand numbness that had creeped up in her arm. Her R leg was also weak. This started abruptly in the car at 2210. She was brought in to the ED by EMS as a stroke code.  mRS: 1 tPA: Yes offered. Started at 2333. Thrombectomy: No, no mismatch. NIHSS components Score: Comment  1a Level of Conscious 0[x]  1[]  2[]  3[]      1b LOC Questions 0[x]  1[]  2[]       1c LOC Commands 0[x]  1[]  2[]       2 Best Gaze 0[x]  1[]  2[]       3 Visual 0[x]  1[]  2[]  3[]      4 Facial Palsy 0[]  1[]  2[x]  3[]      5a Motor Arm - left 0[x]  1[]  2[]  3[]  4[]  UN[]    5b Motor Arm - Right 0[x]  1[]  2[]  3[]  4[]  UN[]  No drift in RUE but does have R hand grip weakness with slowed rapid alternating movements.  6a Motor Leg - Left 0[x]  1[]  2[]  3[]  4[]  UN[]    6b Motor Leg - Right 0[x]  1[]  2[]  3[]  4[]  UN[]    7 Limb Ataxia 0[x]  1[]  2[]  3[]  UN[]     8 Sensory 0[x]  1[]  2[]  UN[]      9 Best Language 0[x]  1[]  2[]  3[]       10 Dysarthria 0[]  1[x]  2[]  UN[]      11 Extinct. and Inattention 0[x]  1[]  2[]       TOTAL:       ROS   Constitutional Denies weight loss, fever and chills.  HEENT Denies changes in vision and hearing.  Respiratory Denies SOB and cough.   CV Denies palpitations and CP   GI Denies abdominal pain, nausea, vomiting and diarrhea.   GU Denies dysuria and urinary frequency.   MSK Denies myalgia and joint pain.   Skin Denies rash and pruritus.   Neurological Denies headache and syncope.   Psychiatric Denies recent changes in mood. Denies anxiety and depression.   Past History   Past Medical History:  Diagnosis Date   ADD (attention deficit disorder)    Allergy    Anemia    Arthritis    OA back    Cyst of right ovary    Depression    Heart murmur    History of kidney stones    Hypertension    Neuromuscular disorder ([place])  RLS   Restless leg syndrome    Seizures ([place])    in her 20's- none since-? etiology    Past Surgical History:  Procedure Laterality Date   APPENDECTOMY  2008   BUBBLE STUDY  09/18/2020   Procedure: BUBBLE STUDY;  Surgeon: Fay Records, MD;  Location: [facility] ENDOSCOPY;  Service: Cardiovascular;;   BUNIONECTOMY  2009   x 2   TEE WITHOUT CARDIOVERSION N/A 09/18/2020   Procedure: TRANSESOPHAGEAL ECHOCARDIOGRAM (TEE);  Surgeon: Fay Records, MD;  Location: [facility] ENDOSCOPY;  Service: Cardiovascular;  Laterality: N/A;   WISDOM TOOTH EXTRACTION     Family History  Problem Relation Age of Onset   Colon cancer Father 37   Colon cancer Mother 24   Heart attack Mother    Dementia Mother    Colon polyps Neg Hx    Esophageal cancer Neg Hx    Stomach cancer Neg Hx    Rectal cancer Neg Hx    Social History   Socioeconomic History   Marital status: Single    Spouse name: Not on file   Number of children: Not on file   Years of education: Not on file   Highest education level: Not on file  Occupational History   Not on file  Tobacco Use   Smoking status:  Former    Pack years: 0.00   Smokeless tobacco: Never   Tobacco comments:    on and off in the past, quit in 2002  Vaping Use   Vaping Use: Never used  Substance and Sexual Activity   Alcohol use: Yes    Comment: occ   Drug use: Never   Sexual activity: Not on file  Other Topics Concern   Not on file  Social History Narrative   Work or School: Biochemist, clinical - [employer]      Home Situation: none      Spiritual Beliefs: Christian      Lifestyle: no regular exercise; so so            Social Determinants of Radio broadcast assistant Strain: Not on file  Food Insecurity: Not on file  Transportation Needs: Not on file  Physical Activity: Not on file  Stress: Not on file  Social Connections: Not on file   Allergies  Allergen Reactions   Ciprofloxacin Swelling   Levaquin [Levofloxacin] Anaphylaxis    Hives and throat swelling    Amoxil [Amoxicillin] Rash    Extreme itching limbs and trunk    Dynacin [Minocycline] Rash   Sulfamethazine Rash    Medications  (Not in a hospital admission)    Vitals   Vitals:   11/28/20 2300  Weight: 98.7 kg  Height: 5\' 6"  (1.676 m)     Body mass index is 35.11 kg/m.  Physical Exam   General: Laying comfortably in bed; in no acute distress.  HENT: Normal oropharynx and mucosa. Normal external appearance of ears and nose.  Neck: Supple, no pain or tenderness  CV: No JVD. No peripheral edema.  Pulmonary: Symmetric Chest rise. Normal respiratory effort.  Abdomen: Soft to touch, non-tender.  Ext: No cyanosis, edema, or deformity  Skin: No rash. Normal palpation of skin.   Musculoskeletal: Normal digits and nails by inspection. No clubbing.  Neurologic Examination  Mental status/Cognition: Alert, oriented to self, place, month and year, good attention.  Speech/language: Mildly dysarthric speech, fluent, comprehension intact, object naming intact, repetition intact.  Cranial nerves:   CN II Pupils equal and reactive to  light, no  VF deficits    CN III,IV,VI EOM intact, no gaze preference or deviation, no nystagmus    CN V normal sensation in V1, V2, and V3 segments bilaterally    CN VII R facial droop   CN VIII normal hearing to speech    CN IX & X normal palatal elevation, no uvular deviation    CN XI 5/5 head turn and 5/5 shoulder shrug bilaterally   CN XII midline tongue protrusion   Motor:  Muscle bulk: normal, tone normal, pronator drift none tremor none Mvmt Root Nerve  Muscle Right Left Comments  SA C5/6 Ax Deltoid 4+ 5   EF C5/6 Mc Biceps 5 5   EE C6/7/8 Rad Triceps 5 5   WF C6/7 Med FCR     WE C7/8 PIN ECU     F Ab C8/T1 U ADM/FDI 4+ 5   HF L1/2/3 Fem Illopsoas 4 5   KE L2/3/4 Fem Quad 4+ 5   DF L4/5 D Peron Tib Ant 5 5   PF S1/2 Tibial Grc/Sol 5 5    Reflexes:  Right Left Comments  Pectoralis      Biceps (C5/6) 2 2   Brachioradialis (C5/6) 2 2    Triceps (C6/7) 2 2    Patellar (L3/4) 2 2    Achilles (S1)      Hoffman      Plantar     Jaw jerk    Sensation:  Light touch Decreased in R hand.   Pin prick    Temperature    Vibration   Proprioception    Coordination/Complex Motor:  - Finger to Nose with some past pointing. No ataxia. - Heel to shin intact - Rapid alternating movement are slowed in RUE - Gait: unsafe to assess given weakness and post tPA.  Labs   CBC:  Recent Labs  Lab 11/28/20 2316 11/28/20 2329  WBC 7.2  --   NEUTROABS 3.6  --   HGB 14.1 14.3  HCT 42.2 42.0  MCV 93.0  --   PLT 238  --     Basic Metabolic Panel:  Lab Results  Component Value Date   NA 141 11/28/2020   K 3.7 11/28/2020   CO2 26 09/15/2020   GLUCOSE 150 (H) 11/28/2020   BUN 19 11/28/2020   CREATININE 0.60 11/28/2020   CALCIUM 9.4 09/15/2020   GFRNONAA >60 09/16/2020   GFRAA >60 02/05/2017   Lipid Panel:  Lab Results  Component Value Date   LDLCALC 133 (H) 09/16/2020   HgbA1c:  Lab Results  Component Value Date   HGBA1C 5.7 (H) 09/16/2020   Urine Drug Screen:      Component Value Date/Time   LABOPIA NONE DETECTED 09/15/2020 1848   COCAINSCRNUR NONE DETECTED 09/15/2020 1848   LABBENZ NONE DETECTED 09/15/2020 1848   AMPHETMU NONE DETECTED 09/15/2020 1848   THCU NONE DETECTED 09/15/2020 1848   LABBARB NONE DETECTED 09/15/2020 1848    Alcohol Level     Component Value Date/Time   ETH <10 09/15/2020 1848    CT Head without contrast: Personally reviewed and CTH was negative for a large hypodensity concerning for a large territory infarct or hyperdensity concerning for an ICH  CT angio Head and Neck with contrast: Pending official read but stenosis in the L MCA with good flow distally.  MRI Brain: Pending.   Impression   Sarah Simpson is a 55 y.o. 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 Today, she was brought in with R hand grip weakness, numbness in R hand along with R hipflexion weakness and R hand numbness with R facial droop and mildly dysarthric speech.   Suspect new stroke vs recrudescence. Given that this was sudden onset and she was within tPA window, tPA risks and benefits discussed with patient including 30% chance of benefit and 6% chance of ICH. Patient opted to tPA.  Thrombectomy was not offered due to mild symptoms and no mismatch on CT Perfusion.  Recommendations   Acute LMCA stroke vs recrudescence of prior stroke symptoms status post tPA: Plan: - Frequent NeuroChecks for post tPA care per stroke unit protocol: - Initial CTH demonstrated no acute hemorrhage or mass - MRI Brain - pending - CTA - stenotic L MCA with good flow distally, no LVO. - CT Perfusion - no mismatch - Recent TTE and TEE in April 2022 with EF of 60-65%, no PFO. -  Lipid Panel: LDL - pending  - Statin: continue home Atorvastatin 80mg  daily. - HbA1c: pending. - Antithrombotic: Start ASA 81 mg daily if 24 h CTH does not show acute hemorrhage - DVT prophylaxis: SCDs. Pharmacologic prophylaxis if 24 h CTH does not demonstrate acute hemorrhage - Systolic Blood Pressure goal: < 180 mm Hg - Telemetry monitoring for arrhythmia: 72 hours - Swallow screen - ordered - PT/OT/SLP consults - Would transition to low intensity monitoring if NIHSS is stable at 2 hours.   Hypertension Stable Goal SBP after tPA, as above Long-term BP goal normotensive   Hyperlipidemia Home meds: Atorvastatin 80mg  daily. LDL pending. Prior LDL of 133, not at goal < 70 High intensity statin: Lipitor 80mg  Continue statin at discharge     Other Stroke Risk Factors Former Cigarette smoker. Current ETOH use, alcohol level <10, advised to drink no more than 1 drink a day Obesity, Body mass index is 35.11 kg/m., BMI >/= 30 associated with increased stroke risk, recommend weight loss, diet and exercise as appropriate Migraines ______________________________________________________________________  This patient is critically ill and at significant risk of neurological worsening, death and care requires constant monitoring of vital signs, hemodynamics,respiratory and cardiac monitoring, neurological assessment, discussion with family, other specialists and medical decision making of high complexity. I spent 40 minutes of neurocritical care time  in the care of  this patient. This was time spent independent of any time provided by nurse practitioner or PA.  Donnetta Simpers [facility] Pager Number [phone] 11/29/2020  1:30 AM  Full code and would like her sister(Lisa Brigitte Pulse: [phone]), her brother Jenny Reichmann MBTDHRC([phone]), her friends Delfino Lovett and Lelon Frohlich Langhoff([phone]) to make joint medical decisions on her behalf if she is unable to.  Thank you for the  opportunity to take part in the care of this patient. If you have any further questions, please contact the neurology consultation attending.  Signed,  [place] Pager Number [phone] _ _ _   _ __   _ __ _ _  __ __   _ __   __ _

## 2020-11-29 ENCOUNTER — Other Ambulatory Visit: Payer: Self-pay

## 2020-11-29 ENCOUNTER — Inpatient Hospital Stay (HOSPITAL_COMMUNITY): Payer: Self-pay

## 2020-11-29 LAB — RAPID URINE DRUG SCREEN, HOSP PERFORMED
Amphetamines: NOT DETECTED
Barbiturates: NOT DETECTED
Benzodiazepines: NOT DETECTED
Cocaine: NOT DETECTED
Opiates: NOT DETECTED
Tetrahydrocannabinol: NOT DETECTED

## 2020-11-29 LAB — LIPID PANEL
Cholesterol: 108 mg/dL (ref 0–200)
HDL: 42 mg/dL (ref >40)
LDL Cholesterol: 39 mg/dL (ref 0–99)
Total CHOL/HDL Ratio: 2.6 RATIO
Triglycerides: 136 mg/dL (ref <150)
VLDL: 27 mg/dL (ref 0–40)

## 2020-11-29 LAB — RESP PANEL BY RT-PCR (FLU A&B, COVID) ARPGX2
Influenza A by PCR: NEGATIVE
Influenza B by PCR: NEGATIVE
SARS Coronavirus 2 by RT PCR: NEGATIVE

## 2020-11-29 MED ORDER — SODIUM CHLORIDE 0.9 % IV SOLN: INTRAVENOUS | Status: DC

## 2020-11-29 MED ORDER — LORAZEPAM 2 MG/ML IJ SOLN: 1.0000 mg | Freq: Once | INTRAMUSCULAR | Status: DC | PRN

## 2020-11-29 MED ORDER — VALACYCLOVIR HCL 500 MG PO TABS
500.0000 mg | ORAL_TABLET | Freq: Every day | ORAL | Status: DC
  Administered 2020-11-29 – 2020-11-30 (×2): 500 mg via ORAL
  Filled 2020-11-29 (×3): qty 1

## 2020-11-29 MED ORDER — ATORVASTATIN CALCIUM 80 MG PO TABS
80.0000 mg | ORAL_TABLET | Freq: Every day | ORAL | Status: DC
  Administered 2020-11-29 – 2020-11-30 (×2): 80 mg via ORAL
  Filled 2020-11-29: qty 1
  Filled 2020-11-29: qty 2

## 2020-11-29 MED ORDER — GABAPENTIN 300 MG PO CAPS
300.0000 mg | ORAL_CAPSULE | Freq: Three times a day (TID) | ORAL | Status: DC | PRN
  Administered 2020-11-29: 300 mg via ORAL
  Filled 2020-11-29: qty 1

## 2020-11-29 MED ORDER — SENNOSIDES-DOCUSATE SODIUM 8.6-50 MG PO TABS: 1.0000 | ORAL_TABLET | Freq: Every evening | ORAL | Status: DC | PRN

## 2020-11-29 MED ORDER — ESCITALOPRAM OXALATE 10 MG PO TABS
10.0000 mg | ORAL_TABLET | Freq: Every day | ORAL | Status: DC
  Administered 2020-11-29 – 2020-11-30 (×2): 10 mg via ORAL
  Filled 2020-11-29 (×2): qty 1

## 2020-11-29 MED ORDER — STROKE: EARLY STAGES OF RECOVERY BOOK
Freq: Once | Status: DC
  Filled 2020-11-29: qty 1

## 2020-11-29 MED ORDER — ACETAMINOPHEN 650 MG RE SUPP: 650.0000 mg | RECTAL | Status: DC | PRN

## 2020-11-29 MED ORDER — ACETAMINOPHEN 325 MG PO TABS
650.0000 mg | ORAL_TABLET | ORAL | Status: DC | PRN
  Administered 2020-11-29: 650 mg via ORAL
  Filled 2020-11-29: qty 2

## 2020-11-29 MED ORDER — LORAZEPAM 2 MG/ML IJ SOLN
2.0000 mg | Freq: Once | INTRAMUSCULAR | Status: AC | PRN
  Administered 2020-11-29: 2 mg via INTRAVENOUS
  Filled 2020-11-29: qty 1

## 2020-11-29 MED ORDER — ADULT MULTIVITAMIN W/MINERALS CH
1.0000 | ORAL_TABLET | Freq: Every day | ORAL | Status: DC
  Administered 2020-11-29 – 2020-11-30 (×2): 1 via ORAL
  Filled 2020-11-29 (×2): qty 1

## 2020-11-29 MED ORDER — IOHEXOL 350 MG/ML SOLN
80.0000 mL | Freq: Once | INTRAVENOUS | Status: AC | PRN
  Administered 2020-11-29: 80 mL via INTRAVENOUS

## 2020-11-29 MED ORDER — ACETAMINOPHEN 160 MG/5ML PO SOLN: 650.0000 mg | ORAL | Status: DC | PRN

## 2020-11-29 NOTE — ED Notes (Signed)
Patient transferred to a hospital bed. Patient is A/O x4.

## 2020-11-29 NOTE — Progress Notes (Signed)
OT Cancellation Note  Patient Details Name: Sarah Simpson MRN: [id number] DOB: February 19, 1966   Cancelled Treatment:    Reason Eval/Treat Not Completed: Patient not medically ready;Active bedrest order. Pt had TPA at 2333 yesterday.  [place], OTR/L Acute Rehab Services Pager [phone] Office [phone]    Almon Register 11/29/2020, 7:52 AM

## 2020-11-29 NOTE — ED Notes (Signed)
Swallowed medications well, without difficulty.

## 2020-11-29 NOTE — ED Notes (Addendum)
No changes, or needs. Neuro MD finished at [facility]. Continues to mention: R hand swelling improved with lowering, increases with elevation. Difficult to make fist.

## 2020-11-29 NOTE — Code Documentation (Signed)
Responded to Code Stroke called at [id number] for slurred speech, R sided numbness, and R facial droop, [id number]. Pt arrived at 2315, CBG-153, NIH-3 for R facial droop and dysarthria, CT head negative for acute changes. CTA/CTP-no LVO, BP-129/83. TPA started at 2333. Plan to admit to PCU.

## 2020-11-29 NOTE — ED Notes (Addendum)
Pt alert, NAD, calm, interactive, resps e/u, speaking in clear complete sentences. Denies HA, pain, nausea, dizziness, or sob. Denies questions or needs. Mentions some transient R anterior neck "carotid" discomfort last night, and vision seems more blurry than usual.

## 2020-11-29 NOTE — ED Notes (Addendum)
Per: Moishe Spice, PT and Rolm Baptise, OT: PT and OT have cancelled our eval of this pt today as she received TPA at 2333 yesterday and has active bedrest orders. If something changes and the MD wants [place] to see her anyway please let me know

## 2020-11-29 NOTE — ED Notes (Signed)
CBG 139. Not Crossing over.

## 2020-11-29 NOTE — ED Notes (Signed)
Patient transported to MRI 

## 2020-11-29 NOTE — Progress Notes (Signed)
PT Cancellation Note  Patient Details Name: Sarah Simpson MRN: [id number] DOB: 23-Oct-1965   Cancelled Treatment:    Reason Eval/Treat Not Completed: Patient not medically ready;Active bedrest order. Pt had TPA at 2333 yesterday. Will plan to follow-up as medically appropriate.   Moishe Spice, PT, DPT Acute Rehabilitation Services  Pager: [phone] Office: [place] 11/29/2020, 9:08 AM

## 2020-11-29 NOTE — ED Triage Notes (Signed)
Pt with friend when they noticed patient had slight dysarthria and notable facial droop. Pt BIBA via [facility]. Per Medic, pt LKW around 2210. Pt A&Ox4 with 2 IV's for access. Patient GCS 15 at this time and on room air.

## 2020-11-29 NOTE — Progress Notes (Signed)
Stroke Neurology Progress Note  IDENTIFYING INFORMATION  Sarah Simpson MR# [id number] 11/29/2020  INTERVAL HISTORY  She is back to her baseline and only describes swelling in her right hand, pain raising her right arm and right sided numbness which is chronic.  MEDICATIONS      Current Facility-Administered Medications (Cardiovascular):    atorvastatin (LIPITOR) tablet 80 mg  Current Outpatient Medications (Cardiovascular):    atorvastatin (LIPITOR) 80 MG tablet, Take 1 tablet (80 mg total) by mouth daily at 6 PM. (Patient taking differently: Take 80 mg by mouth daily.)   losartan (COZAAR) 25 MG tablet, Take 1 tablet (25 mg total) by mouth daily.   Current Outpatient Medications (Respiratory):    diphenhydrAMINE (BENADRYL) 25 mg capsule, Take 25 mg by mouth at bedtime.   Pseudoeph-Doxylamine-DM-APAP (NYQUIL PO), Take 1 Dose by mouth at bedtime. [employer] brand  Current Facility-Administered Medications (Analgesics):    acetaminophen (TYLENOL) tablet 650 mg **OR** acetaminophen (TYLENOL) 160 MG/5ML solution 650 mg **OR** acetaminophen (TYLENOL) suppository 650 mg  Current Outpatient Medications (Analgesics):    acetaminophen (TYLENOL) 325 MG tablet, Take 650 mg by mouth every 6 (six) hours as needed for moderate pain or headache.   aspirin 81 MG EC tablet, TAKE 1 TABLET (81 MG TOTAL) BY MOUTH DAILY. SWALLOW WHOLE. (Patient taking differently: Take 81 mg by mouth daily. SWALLOW WHOLE.)    Current Facility-Administered Medications (Other):     stroke: mapping our early stages of recovery book   0.9 %  sodium chloride infusion   escitalopram (LEXAPRO) tablet 10 mg   gabapentin (NEURONTIN) capsule 300 mg   multivitamin with minerals tablet 1 tablet   senna-docusate (Senokot-S) tablet 1 tablet   valACYclovir (VALTREX) tablet 500 mg  Current Outpatient Medications (Other):    escitalopram (LEXAPRO) 10 MG tablet, TAKE 1 TABLET (10 MG TOTAL) BY MOUTH DAILY. (Patient taking  differently: Take 10 mg by mouth daily.)   gabapentin (NEURONTIN) 300 MG capsule, Take 1 capsule by mouth in the morning, 1 capsule in the afternoon and 2 capsules in the evening. (Patient taking differently: Take 300 mg by mouth 3 (three) times daily.)   Multiple Vitamin (MULTIVITAMIN) tablet, Take 1 tablet by mouth daily.   valACYclovir (VALTREX) 500 MG tablet, Take 250 mg by mouth daily.  VITAL SIGNS  Temp:  [98 F (36.7 C)-98.2 F (36.8 C)] 98 F (36.7 C) (06/12 0545) Pulse Rate:  [56-73] 64 (06/12 1000) Resp:  [10-22] 12 (06/12 1000) BP: (102-139)/(56-87) 131/76 (06/12 1000) SpO2:  [94 %-100 %] 97 % (06/12 1000) Weight:  [98.7 kg] 98.7 kg (06/12 0545)  PHYSICAL EXAM   General Physical Exam  General: NAD, lying comfortably in bed HENT: Normal oropharynx and mucosa. Normal external appearance of ears and nose. CV/Chest: No JVD, normal S1S2 Lungs: No audible wheezing. Normal work of breathing. No accessory muscle use Abdomen: Non distended, non tender Extremities: Warm and well perfused. No appreciable edema, cyanosis or deformity. Skin: No rash. Normal palpation of skin.   Musculoskeletal: No joint tenderness. Normal digits and nails by inspection. No clubbing.  Neurologic Examination  Mental status/Cognition: alert; oriented to month and age; good attention; no apparent neglect Speech/language: fluent; comprehension intact; object naming intact; repetition intact Visual Fields are full. Pupils are equal, round, and reactive to light. EOMI without ptosis or diploplia.  Facial sensation is symmetric to temperature Facial movement is symmetric.  Hearing is intact to voice. Uvula midline and palate elevates symmetrically. Shoulder shrug is symmetric. Tongue is midline without atrophy  or fasciculations.  Tone is normal. Bulk is normal. 5/5 strength was present in all four extremities. Sensation is symmetric to light touch and temperature in the arms and legs. Deep Tendon  Reflexes: 2+ and symmetric in the biceps and patellae. Toes are downgoing bilaterally. FNF and HKS are intact bilaterally. Rapid alternating movements are normal without dysdiadochokinesia   IMAGING/DIAGNOSTIC STUDIES  CT head showed No acute intracranial abnormality. Aspects = 10. Chronic left basal ganglia/internal capsule infarct.  Lab Results  Component Value Date   HGBA1C 5.7 (H) 09/16/2020   Lab Results  Component Value Date   LDLCALC 39 11/29/2020     ASSESSMENT AND PLAN  ?Sarah Simpson is a 55 y.o. 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 She is back to her post stroke baseline.  - Etiology by TOAST Criteria - likely cardioembolic as she has had a complete stroke workup including vessel imaging and TEE March 2022 and she may need loop recorder.   - CT head: No acute intracranial abnormality. Aspects = 10. Chronic left basal ganglia/internal capsule infarct. - CTA brain/neck: did not show large vessel occlusion or flow limiting stenosis. - CTP: Negative CT perfusion. - MRI: Pending - SBP: Gradually normalize in 5-7 days to goal <140/90. - LDL: 39 - A1C: 5.7 - TTE: Recent TTE and TEE in April 2022 with EF of 60-65%, no PFO. - Statin therapy: Continue high intensity statin. - Antiplatelet therapy: aspirin 81mg  and clopidogrel 75mg  daily for 3 weeks then discontinue aspirin.  -NIHSS on admission 1 -Patient has been started on Mechanical (SCD's) and Pharmacological (SQ heparin/Lovenox) DVT prophylaxis.  -Dysphagia screening ordered, and  will be completed prior to patient receiving oral intake. -Stroke education booklet has been ordered and will be provided by the RN that includes both written and verbal education to the patient and family regarding ischemic strokes. We have reviewed the patient's personal modifiable risk factors. -Patient is being assessed for Rehab by PT/OT/Speech and PM&R if indicated.    Electronically signed by:  Lynnae Sandhoff, MD Page: [phone] 11/29/2020, 10:34 AM

## 2020-11-29 NOTE — ED Notes (Signed)
Pt ambulated to the BR with a steady gait.

## 2020-11-29 NOTE — ED Notes (Signed)
MRI contacted this RN regarding imaging. This RN paged and messaged neuro MD regarding prn med orders for pt to go to imaging and diet orders as pt is NPO at this time. Awaiting response.

## 2020-11-30 ENCOUNTER — Other Ambulatory Visit: Payer: Self-pay

## 2020-11-30 ENCOUNTER — Inpatient Hospital Stay (HOSPITAL_COMMUNITY): Payer: Self-pay

## 2020-11-30 DIAGNOSIS — R200 Anesthesia of skin: Secondary | ICD-10-CM

## 2020-11-30 DIAGNOSIS — Z9282 Status post administration of tPA (rtPA) in a different facility within the last 24 hours prior to admission to current facility: Secondary | ICD-10-CM

## 2020-11-30 DIAGNOSIS — Z8673 Personal history of transient ischemic attack (TIA), and cerebral infarction without residual deficits: Secondary | ICD-10-CM

## 2020-11-30 LAB — HEMOGLOBIN A1C
Hgb A1c MFr Bld: 6.3 % — ABNORMAL HIGH (ref 4.8–5.6)
Mean Plasma Glucose: 134 mg/dL

## 2020-11-30 LAB — GLUCOSE, CAPILLARY: Glucose-Capillary: 139 mg/dL — ABNORMAL HIGH (ref 70–99)

## 2020-11-30 MED ORDER — CLOPIDOGREL BISULFATE 75 MG PO TABS
300.0000 mg | ORAL_TABLET | Freq: Once | ORAL | Status: AC
  Administered 2020-11-30: 300 mg via ORAL
  Filled 2020-11-30: qty 4

## 2020-11-30 MED ORDER — CLOPIDOGREL BISULFATE 75 MG PO TABS: 75.0000 mg | ORAL_TABLET | Freq: Every day | ORAL | Status: DC

## 2020-11-30 MED ORDER — ASPIRIN 81 MG PO CHEW
81.0000 mg | CHEWABLE_TABLET | Freq: Every day | ORAL | 0 refills | Status: AC
  Filled 2020-11-30: qty 21, 21d supply, fill #0

## 2020-11-30 MED ORDER — ASPIRIN 81 MG PO CHEW
81.0000 mg | CHEWABLE_TABLET | Freq: Every day | ORAL | Status: DC
  Administered 2020-11-30: 81 mg via ORAL
  Filled 2020-11-30: qty 1

## 2020-11-30 MED ORDER — CLOPIDOGREL BISULFATE 75 MG PO TABS
75.0000 mg | ORAL_TABLET | Freq: Every day | ORAL | 3 refills | Status: DC
  Filled 2020-11-30: qty 30, 30d supply, fill #0
  Filled 2021-01-11: qty 90, 90d supply, fill #1
  Filled 2021-04-04: qty 90, 90d supply, fill #2
  Filled 2021-04-16: qty 30, 30d supply, fill #2
  Filled 2021-05-06 – 2021-05-14 (×2): qty 30, 30d supply, fill #3
  Filled 2021-06-13: qty 30, 30d supply, fill #4
  Filled 2021-07-04: qty 30, 30d supply, fill #5
  Filled 2021-07-05 – 2021-07-11 (×2): qty 30, 30d supply, fill #0

## 2020-11-30 NOTE — Discharge Summary (Addendum)
Stroke Discharge Summary  Patient ID: Sarah Simpson       MRN: [id number]      DOB: [birth year]  Date of Admission: 11/28/2020 Date of Discharge: 11/30/2020  Attending Physician: Dr. Erlinda Hong  Consultant(s):  None  Patient's PCP:  Kerin Perna, NP  DISCHARGE DIAGNOSIS: Principal Problem:   Right sided numbness   Allergies as of 11/30/2020       Reactions   Ciprofloxacin Swelling   Levaquin [levofloxacin] Anaphylaxis   Hives and throat swelling    Amoxil [amoxicillin] Rash   Extreme itching limbs and trunk    Dynacin [minocycline] Rash   Sulfamethazine Rash        Medication List     STOP taking these medications    Aspirin Low Dose 81 MG EC tablet Generic drug: aspirin Replaced by: aspirin 81 MG chewable tablet   diphenhydrAMINE 25 mg capsule Commonly known as: BENADRYL   NYQUIL PO       TAKE these medications    acetaminophen 325 MG tablet Commonly known as: TYLENOL Take 650 mg by mouth every 6 (six) hours as needed for moderate pain or headache.   aspirin 81 MG chewable tablet Chew 1 tablet (81 mg total) by mouth daily for 21 days. Replaces: Aspirin Low Dose 81 MG EC tablet   atorvastatin 80 MG tablet Commonly known as: LIPITOR Take 1 tablet (80 mg total) by mouth daily at 6 PM. What changed: when to take this   clopidogrel 75 MG tablet Commonly known as: PLAVIX Take 1 tablet (75 mg total) by mouth daily.   escitalopram 10 MG tablet Commonly known as: LEXAPRO TAKE 1 TABLET (10 MG TOTAL) BY MOUTH DAILY.   losartan 25 MG tablet Commonly known as: COZAAR Take 1 tablet (25 mg total) by mouth daily.   multivitamin tablet Take 1 tablet by mouth daily.   valACYclovir 500 MG tablet Commonly known as: VALTREX Take 250 mg by mouth daily.       ASK your doctor about these medications    gabapentin 300 MG capsule Commonly known as: NEURONTIN Take 1 capsule by mouth in the morning, 1 capsule in the afternoon and 2 capsules in the evening.        Pertinent Labwork  Lipid Panel    Component Value Date/Time   CHOL 108 11/29/2020 0244   TRIG 136 11/29/2020 0244   HDL 42 11/29/2020 0244   CHOLHDL 2.6 11/29/2020 0244   VLDL 27 11/29/2020 0244   LDLCALC 39 11/29/2020 0244   HgbA1C  Lab Results  Component Value Date   HGBA1C 6.3 (H) 11/29/2020    Urine Drug Screen     Component Value Date/Time   LABOPIA NONE DETECTED 11/29/2020 1956   COCAINSCRNUR NONE DETECTED 11/29/2020 1956   LABBENZ NONE DETECTED 11/29/2020 1956   AMPHETMU NONE DETECTED 11/29/2020 1956   THCU NONE DETECTED 11/29/2020 1956   LABBARB NONE DETECTED 11/29/2020 1956      Pertinent Imaging  11/28/20 CT Head WO Contrast  11/28/20 CT Angio Head and Neck  Negative CTA of the head and neck. No large vessel occlusion, hemodynamically significant stenosis, or other acute vascular abnormality.   6/12 MRI Brain WO Contrast  IMPRESSION: 1. No acute intracranial abnormality. 2. 2 cm remote infarct involving the left posterior basal ganglia/internal capsule.   History of Present Illness: Sarah Simpson is a 55 y.o. female with PMH significant for depression, HTN, restless leg syndrome, seizure like episodes in her 58s, prior small acute stroke  in central white matter posterior to the internal capsule and lentiform nucleus extending to the caudate tail with residual with residual blurred vision and residual hand numbness with no weakness.   Today, she was brought in for concerns for R sided weakness and aphasia. Slowed thought process on my assess with no aphasia but does have R hand grip weakness which per patient is new along with Facial droop and dysarthria.   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 Just got done with a job and wrapped up all the stuff and driving back when her R hand just froze and she could not get it to move along with R hand numbness that had creeped up in her arm. Her R leg was also weak. This started abruptly in the car at  2210. She was brought in to the ED by EMS as a stroke code.   mRS: 1 tPA: Yes offered. Started at 2333. Thrombectomy: No, no mismatch.  Physical Examination  Blood pressure 120/79, pulse 70, temperature 98.4 F (36.9 C), temperature source Oral, resp. rate 17, height 5\' 7"  (1.702 m), weight 97.6 kg, SpO2 99 %.  MS: AAOx4, following commands Speech: Fluent with repetition and naming intact  LT:JQZE, VFF, When asked to smile the right side of her face does not activate however when asked to puff out her cheeks and blow, face is symmetric. Also noted to be symmetric when talking with her friend. At rest face is symmetric. Tongue midline. Shoulder shrug intact  Motor: 5/5 strength throughout  Sensation: Intact to light tough throughout  Coordination: Intact FNF  Gait: Deferred    Assessment and Plan:  Sarah Simpson is a 55 y.o. female Hx depression, HTN, restless leg syndrome, seizure like episodes in her 5s, prior small acute stroke in central white matter posterior to the internal capsule and lentiform nucleus extending to the caudate tail who presents with right sided numbness and weakness. She was admitted for stroke work up.  #Transient Right Hand Numbness + Right Sided Weakness   She presents with the sx describe above. Of note, she had a complete stroke workup including vessel imaging and TEE in March of this year. Her left caudate tail stroke was felt to be cryptogenic.   This admission her CTH showed NAICP. CTA Head and Neck did not show significant stenosis. MRI Brain was negative for acute stroke. Stroke labs w/LDL 39, A1C 5.7. Recent TTE and TEE in April 2022 with EF of 60-65%, no PFO. Routine EEG was negative for seizure.   Her physical exam is pertinent for functional components including lack of activation of the right cheek when asked to smile but she is able to puff her cheeks symmetrically, blow air with ease and when talking facial activation is noted to be symmetric  with no right facial weakness. Differentials for presenting sx include TIA, seizure, conversion disorder.   For stroke prevention purposes she was placed on DAPT for three weeks and is to do Plavix monotherapy after given she had these symptoms while taking Aspirin consistently. PT/OT evaluated her and felt she was ok to dc to home with no acute needs.   Ruta Hinds, AGPCNP-BC Stroke Nurse Practitioner   35 minutes were spent preparing this discharge   ATTENDING NOTE: I reviewed above note and agree with the assessment and plan. Pt was seen and examined.   55 year old female with history of hypertension, " grand mal seizure" in 20s, migraine, RLS, ADD, obesity, stroke in 08/2020 admitted for right-sided  weakness, right facial droop and slurred speech.  In 08/2020 patient admitted for speech difficulty.  MRI showed left AchA small infarct, MRI and carotid Doppler unremarkable.  EF 60 to 65%.  DVT negative.  TEE unremarkable no PFO.  LDL 133, A1c 5.7, hypercoagulable labs negative.  Initially plan for loop recorder, however patient no insurance will cover.  Discharged on DAPT for 3 weeks and then Lipitor 80.  On this admission, CT no acute abnormality, status post tPA.  CTA head and neck and CT perfusion negative.  UDS negative.  MRI no acute infarct.  LDL 39 and A1c 6.3.  Creatinine 0.60.  During encounter today, patient friend is at the bedside. She stated that Sat night, she had episode of first right arm and hand drawing up to her chest and then left arm and hand drawing up. When EMS arrived, her tonic posturing was resolved gradually.    She and her friend stated that she had new right facial droop, however, on talking and mouth blowing action, no right facial weakness seen.  Otherwise neurologically intact on exam.  Etiology for patient's symptoms concerning for TIA versus complicated migraine versus conversion.  Recommend aspirin and Plavix DAPT for 3 weeks and then Plavix alone.  Continue  Lipitor 80.  PT/OT recommend outpatient OT.  Patient is ready for discharge.  She will follow-up with Dr. Leonie Man at stroke clinic at [facility].  For detailed assessment and plan, please refer to above as I have made changes wherever appropriate.   Rosalin Hawking, MD PhD Stroke Neurology 11/30/2020 8:27 PM

## 2020-11-30 NOTE — Evaluation (Signed)
Occupational Therapy Evaluation/Discharge Patient Details Name: Sarah Simpson MRN: [id number] DOB: 15-Nov-1965 Today's Date: 11/30/2020    History of Present Illness Pt is a 55 y.o. female brought in for concerns for R sided weakness and aphasia. Due to sudden onset of stroke symptoms and within given timeframe, pt given tPA. MRI shows no acute abnormalities. PMH: ADD, heart murmur, HTN, restless leg syndrome, seizures, CVA   Clinical Impression   PTA, pt lives alone and reports Independence in all daily tasks, works in [employer] Pt presents now at baseline for ADLs/mobility. Pt able to ambulate to bathroom independently and navigate a flight of steps without assistance. Pt does endorse continued R hand weakness, difficulty with composite flexion and chronic neuropathic pain though reports symptoms much better than initial presentation to ED. Educated pt on exercises for R hand strength (provided squeeze ball) and ROM/stretching with pt able to return demo well. Anticipate quick recovery of R hand to prior function though encouraged pt to consider outpatient therapy if deficits persist. No further skilled OT services needed at acute level. OT to sign off.     Follow Up Recommendations  Other (comment) (consider OP OT if R hand deficits continue)    Equipment Recommendations  None recommended by OT    Recommendations for Other Services       Precautions / Restrictions Precautions Precautions: None Restrictions Weight Bearing Restrictions: No      Mobility Bed Mobility Overal bed mobility: Independent                  Transfers Overall transfer level: Independent Equipment used: None                  Balance Overall balance assessment: No apparent balance deficits (not formally assessed)                                         ADL either performed or assessed with clinical judgement   ADL Overall ADL's : Independent                                        General ADL Comments: in bathroom on OT entry, able to complete remainder of task, LB dressing and navigate flight of steps/in hallway independently     Vision Patient Visual Report: No change from baseline Vision Assessment?: No apparent visual deficits     Perception     Praxis      Pertinent Vitals/Pain Pain Assessment: Faces Faces Pain Scale: Hurts a little bit Pain Location: R hand with stretching Pain Descriptors / Indicators: Burning Pain Intervention(s): Monitored during session;Limited activity within patient's tolerance     Hand Dominance Right   Extremity/Trunk Assessment Upper Extremity Assessment Upper Extremity Assessment: RUE deficits/detail RUE Deficits / Details: functional grasp, difficulty with composite flexion. able to oppose digits without issue. reports some neuropathic pain with frequent use of R hand RUE Coordination: decreased fine motor   Lower Extremity Assessment Lower Extremity Assessment: Overall WFL for tasks assessed;RLE deficits/detail RLE Deficits / Details: hx of neuropathic pain per pt   Cervical / Trunk Assessment Cervical / Trunk Assessment: Normal   Communication Communication Communication: No difficulties   Cognition Arousal/Alertness: Awake/alert Behavior During Therapy: WFL for tasks assessed/performed Overall Cognitive Status: Within Functional Limits for tasks assessed  General Comments  VSS on RA. Pt reports hx of neuropathic pain in R hand and difficulty holding to small objects due to pain (eyeliner, etc). Provided education on strategies to trial, as well as built up foam grips for ADL items at home.    Exercises     Shoulder Instructions      Home Living Family/patient expects to be discharged to:: Private residence Living Arrangements: Alone Available Help at Discharge: Friend(s);Available PRN/intermittently Type of Home:  Other(Comment) (townhome) Home Access: Stairs to enter Entrance Stairs-Number of Steps: 4 Entrance Stairs-Rails: None Home Layout: Two level Alternate Level Stairs-Number of Steps: flight Alternate Level Stairs-Rails: Left Bathroom Shower/Tub: Teacher, early years/pre: Standard     Home Equipment: None          Prior Functioning/Environment Level of Independence: Independent        Comments: works part time in [place]. still drives, has 2 cats        OT Problem List: Decreased strength;Decreased range of motion;Decreased coordination      OT Treatment/Interventions:      OT Goals(Current goals can be found in the care plan section) Acute Rehab OT Goals Patient Stated Goal: go home today, manage neuropathy pain better OT Goal Formulation: All assessment and education complete, DC therapy  OT Frequency:     Barriers to D/C:            Co-evaluation              AM-PAC OT "6 Clicks" Daily Activity     Outcome Measure Help from another person eating meals?: None Help from another person taking care of personal grooming?: None Help from another person toileting, which includes using toliet, bedpan, or urinal?: None Help from another person bathing (including washing, rinsing, drying)?: None Help from another person to put on and taking off regular upper body clothing?: None Help from another person to put on and taking off regular lower body clothing?: None 6 Click Score: 24   End of Session Nurse Communication: Mobility status  Activity Tolerance: Patient tolerated treatment well Patient left: in bed;with call bell/phone within reach  OT Visit Diagnosis: Muscle weakness (generalized) (M62.81)                Time: 1165-7903 OT Time Calculation (min): 27 min Charges:  OT General Charges $OT Visit: 1 Visit OT Evaluation $OT Eval Low Complexity: 1 Low OT Treatments $Self Care/Home Management : 8-22 mins  Malachy Chamber, OTR/L Acute Rehab  Services Office: [phone]   Layla Maw 11/30/2020, 9:37 AM

## 2020-11-30 NOTE — Progress Notes (Signed)
EEG complete - results pending 

## 2020-11-30 NOTE — Progress Notes (Addendum)
Pt transferred from ED to 4E21. Alert and oriented x 4. Ambulated to restroom with one standby assist. She's hemodynamically stable. No distress at arrival. Neuro checked per document below. We found some neuro deficits. MD aware.     11/30/20 0106  Neurological  Neuro (WDL) X  Orientation Level Oriented X4  Cognition Appropriate attention/concentration;Appropriate judgement;Appropriate safety awareness;Appropriate for developmental age;Follows commands  Speech Clear;Appropriate for developmental age  Pupil Assessment  Yes  R Pupil Size (mm) 4  R Pupil Shape Round  R Pupil Reaction Brisk  L Pupil Size (mm) 4  L Pupil Shape Round  L Pupil Reaction Brisk  Additional Pupil Assessments No  Motor Function/Sensation Assessment Head;Grip;Elbow extension;Elbow flexion;Pronator drift;Dorsiflexion;Plantar flexion;Arm ataxia;Leg ataxia;Sensation;Motor strength;Motor response  Facial Symmetry (S)  Asymmetrical right  Facial Droop (S)  Right  R Hand Grip Present;Strong  L Hand Grip Present;Strong  R Elbow Extension (Push/Biceps) Present;Strong  L Elbow Extension (Push/Biceps) Present;Strong  R Elbow Flexion (Pull/Triceps) Present;Strong  L Elbow Flexion (Pull/Triceps) Present;Strong  Right Pronator Drift Absent  Left Pronator Drift Absent  R Foot Dorsiflexion Present;Strong  L Foot Dorsiflexion Present;Strong  R Foot Plantar Flexion Present;Strong  L Foot Plantar Flexion Present;Strong  R Finger to Nose (Point to [employer]) Smooth  L Finger to Nose (Point to [employer]) Smooth  R Heel to [employer] (Point to [employer]) Smooth  L Heel to [employer] to [employer]) Smooth  RUE Motor Response Purposeful movement  RUE Sensation (S)  Numbness;Decreased on fingers  RUE Motor Strength 5  LUE Motor Response Purposeful movement  LUE Sensation Full sensation  LUE Motor Strength 5  RLE Motor Response Purposeful movement  RLE Sensation Numbness, decreased on toes  RLE Motor Strength 5  LLE Motor Response Purposeful  movement  LLE Sensation Full sensation  LLE Motor Strength 5  Neuro Symptoms Fatigue  Neuro symptoms relieved by Relaxation techniques (Comment);Rest  Seizure Activity  Psychomotor Symptoms None  Glasgow Coma Scale  Eye Opening 4  Best Verbal Response (NON-intubated) 5  Best Motor Response 6  Glasgow Coma Scale Score 15  NIH Stroke Scale ( + Modified Stroke Scale Criteria)   Interval Shift assessment  Level of Consciousness (1a.)    0  LOC Questions (1b. )   + 0  LOC Commands (1c. )   +  0  Best Gaze (2. )  + 0  Visual (3. )  + 0  Facial Palsy (4. )     2  Motor Arm, Left (5a. )   + 0  Motor Arm, Right (5b. )   + 0  Motor Leg, Left (6a. )   + 0  Motor Leg, Right (6b. )   + 0  Limb Ataxia (7. ) 0  Sensory (8. )   + 1  Best Language (9. )   + 0  Dysarthria (10. ) 0  Extinction/Inattention (11.)   + 0  Modified SS Total  + 1  Complete NIHSS TOTAL 3  Neurological  Level of Consciousness Alert    Room oriented. CHG bath given. Call bel in reach. CCMD called with 2nd verified. NSR on monitor.We will continue to monitor.   Kennyth Lose, RN

## 2020-11-30 NOTE — ED Notes (Signed)
Report given to RN receiving pt

## 2020-11-30 NOTE — Procedures (Signed)
Patient Name: Sarah Simpson  MRN: [id number]  Epilepsy Attending: Lora Havens  Referring Physician/Provider: Ruta Hinds, NP Date: 11/30/2020 Duration: 24.20 mins  Patient history: 55 year old female presented with right-sided numbness and weakness.  EEG evaluate for seizures.  Level of alertness: Awake, asleep  AEDs during EEG study: None  Technical aspects: This EEG study was done with scalp electrodes positioned according to the 10-20 International system of electrode placement. Electrical activity was acquired at a sampling rate of 500Hz  and reviewed with a high frequency filter of 70Hz  and a low frequency filter of 1Hz . EEG data were recorded continuously and digitally stored.   Description: The posterior dominant rhythm consists of 9-10 Hz activity of moderate voltage (25-35 uV) seen predominantly in posterior head regions, symmetric and reactive to eye opening and eye closing. Sleep was characterized by vertex waves, sleep spindles (12 to 14 Hz), maximal frontocentral region. Hyperventilation and photic stimulation were not performed.     IMPRESSION: This study is within normal limits. No seizures or epileptiform discharges were seen throughout the recording.  [name] [name]

## 2020-11-30 NOTE — Research (Signed)
Patient is eligible for OPTIMISTmain trial. Patient Information Sheet was reviewed and patient consented to data collection, discharge questions, and 90 day follow-up. All questions were answered.   Mohammed Kindle, RN  Stroke Response Coordinator

## 2020-11-30 NOTE — Plan of Care (Signed)

## 2020-11-30 NOTE — Evaluation (Signed)
Speech Language Pathology Evaluation Patient Details Name: Shianne Zeiser MRN: [id number] DOB: Feb 28, 1966 Today's Date: 11/30/2020 Time: 9629-5284 SLP Time Calculation (min) (ACUTE ONLY): 25 min  Problem List:  Patient Active Problem List   Diagnosis Date Noted   Family history of malignant neoplasm of gastrointestinal tract 10/02/2020   Genital herpes simplex 10/02/2020   Menopause 10/02/2020   Moderate recurrent major depression ([place]) 10/02/2020   New daily persistent headache 10/02/2020   Obesity 10/02/2020   Prediabetes 10/02/2020   Stroke (cerebrum) ([place]) 09/16/2020   Acute CVA (cerebrovascular accident) ([place]) 09/15/2020   Benign essential hypertension 09/15/2020   Past Medical History:  Past Medical History:  Diagnosis Date   ADD (attention deficit disorder)    Allergy    Anemia    Arthritis    OA back    Cyst of right ovary    Depression    Heart murmur    History of kidney stones    Hypertension    Neuromuscular disorder ([place])    RLS   Restless leg syndrome    Seizures ([place])    in her 20's- none since-? etiology    Past Surgical History:  Past Surgical History:  Procedure Laterality Date   APPENDECTOMY  2008   BUBBLE STUDY  09/18/2020   Procedure: BUBBLE STUDY;  Surgeon: Fay Records, MD;  Location: [facility] ENDOSCOPY;  Service: Cardiovascular;;   BUNIONECTOMY  2009   x 2   TEE WITHOUT CARDIOVERSION N/A 09/18/2020   Procedure: TRANSESOPHAGEAL ECHOCARDIOGRAM (TEE);  Surgeon: Fay Records, MD;  Location: [facility];  Service: Cardiovascular;  Laterality: N/A;   WISDOM TOOTH EXTRACTION     HPI:  Kaleeya Hancock is a 55 y.o. female with PMH significant for depression, HTN, restless leg syndrome, seizure like episodes in her 35s, prior small acute stroke in central white matter posterior  to the internal capsule and lentiform nucleus extending to the caudate tail with residual with residual blurred vision and residual hand numbness with no weakness.    On  11/28/20, she was brought in for concerns for R sided weakness and aphasia. Slowed thought process on initial neurological assessment with no aphasia but does have R hand grip weakness which per patient is new along with Facial droop and dysarthria; pt administered TPA as she was brought to the hospital within 4 hr window of symptoms first appearing.  MRI head on 11/29/20 indicated No acute intracranial abnormality. 2. 2 cm remote infarct involving the left posterior basal ganglia/internal capsule; EEG pending.  Assessment / Plan / Recommendation Clinical Impression  Pt administered the SLUMS ([place] Mental Status Examination) with a score obtained of 28/30 which falls within normal limits (range of 27 or above considered WFL) with the only items missed included recall of 1 item out of 5 requiring a category cue to recall vs. independent recall.  One directive missed for attention task initially, but with repetition, she was able to follow directive accurately.  Nursing stated she had been sleep deprived the previous night and required medication to assist with sleeping which may have impacted her performance on this assessment.  Otherwise, pt scored within normal limits for this cognitive/linguistic examination, was oriented x4, and exhibited intelligible speech within complex conversation.  ST is not recommended at this time as pt is independent with all cognitive/linguistic skills.  Thank you for this consultation.    SLP Assessment  SLP Recommendation/Assessment: Patient does not need any further Speech Lanaguage Pathology Services SLP Visit Diagnosis: Cognitive  communication deficit (R41.841)    Follow Up Recommendations  None    Frequency and Duration   Evaluation only        SLP Evaluation Cognition  Overall Cognitive Status: Within Functional Limits for tasks assessed Arousal/Alertness: Awake/alert Orientation Level: Oriented X4 Attention: Sustained Sustained Attention:  Appears intact Memory: Appears intact Immediate Memory Recall: Sock;Blue;Bed Memory Recall Sock: Without Cue Memory Recall Blue: Without Cue Memory Recall Bed: With Cue Awareness: Appears intact Problem Solving: Appears intact Safety/Judgment: Appears intact       Comprehension  Auditory Comprehension Overall Auditory Comprehension: Appears within functional limits for tasks assessed Yes/No Questions: Within Functional Limits Commands: Within Functional Limits Conversation: Complex Visual Recognition/Discrimination Discrimination: Within Function Limits Reading Comprehension Reading Status: Within funtional limits    Expression Expression Primary Mode of Expression: Verbal Verbal Expression Overall Verbal Expression: Appears within functional limits for tasks assessed Initiation: No impairment Level of Generative/Spontaneous Verbalization: Conversation Repetition: No impairment Naming: No impairment Pragmatics: No impairment Non-Verbal Means of Communication: Not applicable Written Expression Dominant Hand: Right Written Expression: Within Functional Limits   Oral / Motor  Oral Motor/Sensory Function Overall Oral Motor/Sensory Function: Within functional limits Motor Speech Overall Motor Speech: Appears within functional limits for tasks assessed Respiration: Within functional limits Phonation: Normal Resonance: Within functional limits Articulation: Within functional limitis Intelligibility: Intelligible Motor Planning: Witnin functional limits Motor Speech Errors: Not applicable                      Elvina Sidle, M.S., CCC-SLP 11/30/2020, 3:38 PM

## 2020-11-30 NOTE — Discharge Instructions (Signed)
Sarah Simpson,  You were hospitalized due to right sided numbness and weakness.   Fortunately, your work up did not show a new stroke or a seizure.   Given you have had a prior stroke and did have stroke like symptoms please take Aspirin and Plavix together for 21 days. Your last day of taking these medications together is July 4th. After July 4th take Plavix only. Also take Atorvastatin to reduce your risk of having a future stroke.   You are to follow up in stroke clinic. We will be in touch regarding this appointment.   Given you were hospitalized please make an appointment with your PCP. You are to make this appointment.   It was a pleasure caring for you. Your medical team wishes you the best.   Sincerely,   [facility]

## 2020-11-30 NOTE — Progress Notes (Signed)
PT Cancellation Note  Patient Details Name: Sarah Simpson MRN: [id number] DOB: 02/03/1966   Cancelled Treatment:    Reason Eval/Treat Not Completed: Other (comment).  Pt was seen for mobility by OT and has walked on stairs and reported back to MD.  Will discharge PT order, and please reorder if pt has different mobility issues.   Ramond Dial 11/30/2020, 10:27 AM  Mee Hives, PT MS Acute Rehab Dept. Number: [place] [facility] [place]

## 2020-12-01 ENCOUNTER — Telehealth: Payer: Self-pay

## 2020-12-01 ENCOUNTER — Other Ambulatory Visit: Payer: Self-pay

## 2020-12-01 NOTE — Telephone Encounter (Signed)
   Transition Care Management Follow-up Telephone Call   Date of discharge and from where:[facility] on 11/30/2020 How have you been since you were released from the hospital? Better  Any questions or concerns? No questions/concerns reported.  Items Reviewed: Did the pt receive and understand the discharge instructions provided? Pt have the instructions and have no questions.  Medications obtained and verified?Pt stated she did not pick up her meds yet and that she has been  resting , will do it today or tomorrow. Stressed the importance of strict adherence to med regime. Verbalized understanding.   Any new allergies since your discharge? None reported  Do you have support at home? Yes, friends, lives by herself Other (ie: DME, Home Health, etc)        Functional Questionnaire: (I = Independent and D = Dependent) ADL's:  Independent.        Follow up appointments reviewed:   PCP Hospital f/u appt confirmed? NP Juluis Mire on 60/27/2022 Specialist Hospital f/u appt confirmed? None scheduled at this time  Are transportation arrangements needed? have transportation   If their condition worsens, is the pt aware to call  their PCP or go to the ED? Yes.Made pt aware if condition worsen or start experiencing rapid weight gain, chest pain, diff breathing, SOB, high fevers, or bleading to refer imediately to ED for further evaluation.  Was the patient provided with contact information for the PCP's office or ED? He has the phone number  Was the pt encouraged to call back with questions or concerns?yes

## 2020-12-03 ENCOUNTER — Other Ambulatory Visit: Payer: Self-pay

## 2020-12-14 ENCOUNTER — Encounter: Payer: Self-pay | Admitting: Neurology

## 2020-12-14 ENCOUNTER — Ambulatory Visit (INDEPENDENT_AMBULATORY_CARE_PROVIDER_SITE_OTHER): Payer: Self-pay | Admitting: Primary Care

## 2020-12-14 ENCOUNTER — Other Ambulatory Visit: Payer: Self-pay

## 2020-12-14 ENCOUNTER — Ambulatory Visit (INDEPENDENT_AMBULATORY_CARE_PROVIDER_SITE_OTHER): Payer: Self-pay | Admitting: Neurology

## 2020-12-14 ENCOUNTER — Encounter (INDEPENDENT_AMBULATORY_CARE_PROVIDER_SITE_OTHER): Payer: Self-pay | Admitting: Primary Care

## 2020-12-14 VITALS — BP 116/78 | HR 60 | Temp 97.3°F | Resp 16 | Wt 214.0 lb

## 2020-12-14 VITALS — BP 113/76 | HR 76 | Ht 67.0 in | Wt 216.0 lb

## 2020-12-14 DIAGNOSIS — R202 Paresthesia of skin: Secondary | ICD-10-CM

## 2020-12-14 DIAGNOSIS — M722 Plantar fascial fibromatosis: Secondary | ICD-10-CM

## 2020-12-14 DIAGNOSIS — I10 Essential (primary) hypertension: Secondary | ICD-10-CM

## 2020-12-14 DIAGNOSIS — E6609 Other obesity due to excess calories: Secondary | ICD-10-CM

## 2020-12-14 DIAGNOSIS — Z8673 Personal history of transient ischemic attack (TIA), and cerebral infarction without residual deficits: Secondary | ICD-10-CM

## 2020-12-14 DIAGNOSIS — Z760 Encounter for issue of repeat prescription: Secondary | ICD-10-CM

## 2020-12-14 DIAGNOSIS — Z09 Encounter for follow-up examination after completed treatment for conditions other than malignant neoplasm: Secondary | ICD-10-CM

## 2020-12-14 DIAGNOSIS — A6000 Herpesviral infection of urogenital system, unspecified: Secondary | ICD-10-CM

## 2020-12-14 DIAGNOSIS — Z1231 Encounter for screening mammogram for malignant neoplasm of breast: Secondary | ICD-10-CM

## 2020-12-14 DIAGNOSIS — Z6833 Body mass index (BMI) 33.0-33.9, adult: Secondary | ICD-10-CM

## 2020-12-14 DIAGNOSIS — R2990 Unspecified symptoms and signs involving the nervous system: Secondary | ICD-10-CM

## 2020-12-14 MED ORDER — VALACYCLOVIR HCL 500 MG PO TABS
250.0000 mg | ORAL_TABLET | Freq: Every day | ORAL | 3 refills | Status: DC
  Filled 2020-12-14: qty 15, 30d supply, fill #0
  Filled 2021-02-01: qty 45, 90d supply, fill #1
  Filled 2021-07-04: qty 45, 90d supply, fill #2
  Filled 2021-07-05: qty 15, 30d supply, fill #0
  Filled 2021-08-15: qty 15, 30d supply, fill #1
  Filled 2021-08-16: qty 15, 30d supply, fill #0
  Filled 2021-09-19: qty 15, 30d supply, fill #1
  Filled 2021-10-17: qty 15, 30d supply, fill #2
  Filled 2021-11-07: qty 15, 30d supply, fill #3

## 2020-12-14 MED ORDER — TOPIRAMATE 25 MG PO TABS
25.0000 mg | ORAL_TABLET | Freq: Two times a day (BID) | ORAL | 3 refills | Status: DC
  Filled 2020-12-14: qty 60, 30d supply, fill #0
  Filled 2021-01-11: qty 180, 90d supply, fill #1
  Filled 2021-04-04: qty 180, 90d supply, fill #2
  Filled 2021-04-16: qty 60, 30d supply, fill #2
  Filled 2021-05-06: qty 60, 30d supply, fill #3

## 2020-12-14 NOTE — Patient Instructions (Signed)
I had a long discussion with the patient with regards to her recent strokelike episode with negative imaging studies.  I recommend she try Topamax 25 mg twice daily with to help with her postop paresthesias and taper and discontinue gabapentin gradually over the next 1 week.  I also recommend we check EEG for seizure activity.  Continue Plavix for stroke prevention and stop aspirin in 1 more week.  Maintain aggressive risk factor modification with strict control of hypertension with blood pressure goal below 130/90, diabetes with hemoglobin A1c goal below 6.5% and lipids with LDL cholesterol goal below 70 mg percent.  She will return for follow-up in the future in 3 months with my nurse practitioner Janett Billow or call earlier if necessary.

## 2020-12-14 NOTE — Progress Notes (Signed)
[name] [address] [place]. [place] [address] [phone]       HOSPITAL FOLLOW UP NOTE  [name] Date of Birth:  Dec 07, 1965 Medical Record Number:  [id number]   Reason for Referral:  hospital stroke follow up    SUBJECTIVE:   CHIEF COMPLAINT:  Chief Complaint  Patient presents with   Follow-up    Room 8 - alone. Hospital follow up for CVA. She has continued having numbness/tingling on right side, especially in her hand. Gabapentin is not helping these symptoms. Reports issues with memory and word finding difficulty.     HPI:   Last office visit 10/22/2020  Sarah Rider, NP )55 year old female with a history of hypertension, seizure like episodes in her 90s, heart murmur, migraine headaches,, tobacco use, obesity, chronic parasthesias bilat hands, depression, RLS, ADD who presented on 09/15/2020 with facial droop and speech difficulties that started 3/28.  Personally reviewed hospitalization pertinent progress notes, lab work and imaging with summary provided.  Evaluated by Dr. Leonie Simpson with stroke work-up revealing acute subcortical infarct in the region of the caudate tail on the left due to large cortical infarct likely of cryptogenic etiology.  LDL cholesterol is 133 mg percent.  Hemoglobin A1c was 5.7.  Recommended DAPT for 3 weeks and aspirin alone.  TEE negative for PFO or cardiac source of embolism identified.  Hypercoagulable labs negative. Loop recorder not placed prior to d/c. Hormonal therapy for postmenopausal hot flashes discontinued.  LDL 133 -recommend initiating atorvastatin 80 mg daily.  Complaints of bilateral paresthesias/headaches/rashes unclear etiology with brain and C-spine imaging no acute findings to explain her symptoms.  Vasculitis labs negative.  No prior stroke history. 10/22/2020, [name] is being seen for hospital follow-up accompanied by her friend, Sarah Simpson. Reports residual blurred vision with reading and writing.  Reports some visual impairment prior to her stroke having to use reading glasses but has had greater difficulty with reading even with using her reading glasses since her stroke.  She has returned back to almost all prior activities.  She has not worked since the onset of [place].  She is currently uninsured and in the process of applying for [employer] assistance program. She also reports almost daily right sided headaches which have been present since her stroke.  She does have history of migraines but has not experienced any migraines since 01/2020 - these headaches are not consistent with her known migraines. Reports continued constant bilateral hand and feet numbness/tingling.  Has been experiencing the symptoms for years but typically come and go but since her stroke they have been constant.  She will occasionally experience painful sensation.  She was prescribed gabapentin 100 mg 3 times daily at hospital discharge but she has been tolerating but does not believe it has been beneficial She has remained on DAPT despite 3-week recommendation but denies bleeding or bruising Remains on atorvastatin 80 mg daily without myalgias Blood pressure today 125/83  No further concerns at this time  Update 12/14/2020: She is seen today for follow-up after recent hospital admission from 11/28/2020 for strokelike symptoms.  She presented with sudden onset of right-sided weakness and numbness.  CT head was unremarkable CT angiogram of the head and neck did not show significant large vessel stenosis.  She received IV tPA and did well.  An MRI scan of the brain was obtained which was negative for acute stroke.  LDL cholesterol was 39 mg percent hemoglobin A1c was 5.7.  Echocardiogram so not repeated as she  had one in April 2022 TEE which was normal.  She had an EEG done this admission which was negative for seizure activity.  Patient physical exam was consistent with functional components including lack of activation of  the right cheek when asked to smile but she was able to puff her cheeks symmetrically or blow air with these and activated facial muscles when talking.  She was felt to have strokelike episode secondary to conversion reaction versus atypical migraine.  Patient states she is done well since discharge.  She has increase her gabapentin to 300 mg in the morning, afternoon and 600 at night but see still not happy and is having right-sided facial paresthesias.  Headaches seem under better control.  She is on aspirin and Plavix tolerating them well with minor bruising and no bleeding.  Blood pressures well controlled.  She is tolerating Lipitor well without muscle aches and pains.    ROS:   14 system review of systems performed and negative with exception of those listed in HPI  PMH:  Past Medical History:  Diagnosis Date   ADD (attention deficit disorder)    Allergy    Anemia    Arthritis    OA back    Cyst of right ovary    Depression    Heart murmur    History of kidney stones    Hypertension    Neuromuscular disorder ([place])    RLS   Restless leg syndrome    Seizures ([place])    in her 20's- none since-? etiology     PSH:  Past Surgical History:  Procedure Laterality Date   APPENDECTOMY  2008   BUBBLE STUDY  09/18/2020   Procedure: BUBBLE STUDY;  Surgeon: Fay Records, MD;  [facility];  Service: Cardiovascular;;   BUNIONECTOMY  2009   x 2   TEE WITHOUT CARDIOVERSION N/A 09/18/2020   Procedure: TRANSESOPHAGEAL ECHOCARDIOGRAM (TEE);  Surgeon: Fay Records, MD;  Location: [facility] ENDOSCOPY;  Service: Cardiovascular;  Laterality: N/A;   WISDOM TOOTH EXTRACTION      Social History:  Social History   Socioeconomic History   Marital status: Single    Spouse name: Not on file   Number of children: Not on file   Years of education: Not on file   Highest education level: Not on file  Occupational History   Not on file  Tobacco Use   Smoking status: Former    Pack years: 0.00    Smokeless tobacco: Never   Tobacco comments:    on and off in the past, quit in 2002  Vaping Use   Vaping Use: Never used  Substance and Sexual Activity   Alcohol use: Yes    Comment: occ   Drug use: Never   Sexual activity: Not on file  Other Topics Concern   Not on file  Social History Narrative   Work or School: Biochemist, clinical - [employer]      Home Situation: none      Spiritual Beliefs: Christian      Lifestyle: no regular exercise; so so            Social Determinants of Radio broadcast assistant Strain: Not on file  Food Insecurity: Not on file  Transportation Needs: Not on file  Physical Activity: Not on file  Stress: Not on file  Social Connections: Not on file  Intimate Partner Violence: Not on file    Family History:  Family History  Problem Relation Age  of Onset   Colon cancer Father 80   Colon cancer Mother 28   Heart attack Mother    Dementia Mother    Colon polyps Neg Hx    Esophageal cancer Neg Hx    Stomach cancer Neg Hx    Rectal cancer Neg Hx     Medications:   Current Outpatient Medications on File Prior to Visit  Medication Sig Dispense Refill   acetaminophen (TYLENOL) 325 MG tablet Take 650 mg by mouth every 6 (six) hours as needed for moderate pain or headache.     aspirin 81 MG chewable tablet Chew 1 tablet (81 mg total) by mouth daily for 21 days. 21 tablet 0   atorvastatin (LIPITOR) 80 MG tablet Take 1 tablet (80 mg total) by mouth daily at 6 PM. 90 tablet 1   clopidogrel (PLAVIX) 75 MG tablet Take 1 tablet (75 mg total) by mouth daily. 60 tablet 3   escitalopram (LEXAPRO) 10 MG tablet TAKE 1 TABLET (10 MG TOTAL) BY MOUTH DAILY. 90 tablet 1   losartan (COZAAR) 25 MG tablet Take 1 tablet (25 mg total) by mouth daily. 90 tablet 1   Multiple Vitamin (MULTIVITAMIN) tablet Take 1 tablet by mouth daily.     valACYclovir (VALTREX) 500 MG tablet Take 0.5 tablets (250 mg total) by mouth daily. 90 tablet 3   No current facility-administered  medications on file prior to visit.    Allergies:   Allergies  Allergen Reactions   Ciprofloxacin Swelling   Levaquin [Levofloxacin] Anaphylaxis    Hives and throat swelling    Amoxil [Amoxicillin] Rash    Extreme itching limbs and trunk    Dynacin [Minocycline] Rash   Sulfamethazine Rash      OBJECTIVE:  Physical Exam  Vitals:   12/14/20 1437  BP: 113/76  Pulse: 76  Weight: 216 lb (98 kg)  Height: 5\' 7"  (1.702 m)   Body mass index is 33.83 kg/m. No results found.  General: Obese middle-aged Caucasian female, seated, in no evident distress Head: head normocephalic and atraumatic.   Neck: supple with no carotid or supraclavicular bruits Cardiovascular: regular rate and rhythm, no murmurs Musculoskeletal: Mild right ankle swelling post fall Skin:  no rash/petichiae Vascular:  Normal pulses all extremities   Neurologic Exam Mental Status: Awake and fully alert.   For speech and language.  Oriented to place and time. Recent and remote memory intact. Attention span, concentration and fund of knowledge appropriate. Mood and affect appropriate.  Cranial Nerves: Fundoscopic exam reveals sharp disc margins. Pupils equal, briskly reactive to light. Extraocular movements full without nystagmus. Visual fields full to confrontation. Hearing intact. Facial sensation intact. Face, tongue, palate moves normally and symmetrically.  Motor: Normal bulk and tone. Normal strength in all tested extremity muscles Sensory.: intact to touch , pinprick , position and vibratory sensation.  Coordination: Rapid alternating movements normal in all extremities. Finger-to-nose and heel-to-shin performed accurately bilaterally. Gait and Station: Arises from chair without difficulty. Stance is normal. Gait demonstrates normal stride length and balance with mild favoring of right ankle due to recent injury s/p fall. Tandem walk and heel toe unable to perform without marked difficulty Reflexes: 1+ and  symmetric. Toes downgoing.            ASSESSMENT: Sarah Simpson is a 55 y.o. year old female presented on 09/15/2020 with facial weakness and speech difficulty that started the day prior with stroke work-up revealing acute subcortical infarct left caudate tail likely of cryptogenic etiology. Vascular  risk factors include HTN, HLD, remote seizure history, migraine headaches, former tobacco use and obesity.  Recent admission on 11/28/2020 with strokelike episode treated with IV tPA with negative brain imaging.     PLAN:   I had a long discussion with the patient with regards to her recent stroke like episode with negative imaging studies.  I recommend she try Topamax 25 mg twice daily with to help with her postop paresthesias and taper and discontinue gabapentin gradually over the next 1 week.  I also recommend we check EEG for seizure activity as she has remote history of seizures.  Continue Plavix for stroke prevention and stop aspirin in 1 more week.  Maintain aggressive risk factor modification with strict control of hypertension with blood pressure goal below 130/90, diabetes with hemoglobin A1c goal below 6.5% and lipids with LDL cholesterol goal below 70 mg percent.  She will return for follow-up in the future in 3 months with my nurse practitioner Janett Billow or call earlier if necessary.  Greater than 50% time during this 40-minute visit was spent on counseling and coordination of care about her strokelike episodes and discussion about seizures, TIA and stroke prevention and answering questions.    Antony Contras, MD [facility] [address] [place], [place] [address]  Phone [phone] Fax [phone] Note: This document was prepared with digital dictation and possible smart phrase technology. Any transcriptional errors that result from this process are unintentional.

## 2020-12-14 NOTE — Progress Notes (Signed)
RF on valacyclovir  Paresthesia in hand, neuropathy right hand

## 2020-12-14 NOTE — Patient Instructions (Signed)
Plantar Fasciitis  Plantar fasciitis is a painful foot condition that affects the heel. It occurs when the band of tissue that connects the toes to the heel bone (plantar fascia) becomes irritated. This can happen as the result of exercising too much or doing other repetitive activities (overuse injury). Plantar fasciitis can cause mild irritation to severe pain that makes it difficult to walk or move. The pain is usually worse in the morning after sleeping, or after sitting or lying down for a period of time. Pain may also beworse after long periods of walking or standing. What are the causes? This condition may be caused by: Standing for long periods of time. Wearing shoes that do not have good arch support. Doing activities that put stress on joints (high-impact activities). This includes ballet and exercise that makes your heart beat faster (aerobic exercise), such as running. Being overweight. An abnormal way of walking (gait). Tight muscles in the back of your lower leg (calf). High arches in your feet or flat feet. Starting a new athletic activity. What are the signs or symptoms? The main symptom of this condition is heel pain. Pain may get worse after the following: Taking the first steps after a time of rest, especially in the morning after awakening, or after you have been sitting or lying down for a while. Long periods of standing still. Pain may decrease after 30-45 minutes of activity, such as gentle walking. How is this diagnosed? This condition may be diagnosed based on your medical history, a physical exam, and your symptoms. Your health care provider will check for: A tender area on the bottom of your foot. A high arch in your foot or flat feet. Pain when you move your foot. Difficulty moving your foot. You may have imaging tests to confirm the diagnosis, such as: X-rays. Ultrasound. MRI. How is this treated? Treatment for plantar fasciitis depends on how severe your  condition is. Treatment may include: Rest, ice, pressure (compression), and raising (elevating) the affected foot. This is called RICE therapy. Your health care provider may recommend RICE therapy along with over-the-counter pain medicines to manage your pain. Exercises to stretch your calves and your plantar fascia. A splint that holds your foot in a stretched, upward position while you sleep (night splint). Physical therapy to relieve symptoms and prevent problems in the future. Injections of steroid medicine (cortisone) to relieve pain and inflammation. Stimulating your plantar fascia with electrical impulses (extracorporeal shock wave therapy). This is usually the last treatment option before surgery. Surgery, if other treatments have not worked after 12 months. Follow these instructions at home: Managing pain, stiffness, and swelling  If directed, put ice on the painful area. To do this: Put ice in a plastic bag, or use a frozen bottle of water. Place a towel between your skin and the bag or bottle. Roll the bottom of your foot over the bag or bottle. Do this for 20 minutes, 2-3 times a day. Wear athletic shoes that have air-sole or gel-sole cushions, or try soft shoe inserts that are designed for plantar fasciitis. Elevate your foot above the level of your heart while you are sitting or lying down.  Activity Avoid activities that cause pain. Ask your health care provider what activities are safe for you. Do physical therapy exercises and stretches as told by your health care provider. Try activities and forms of exercise that are easier on your joints (low impact). Examples include swimming, water aerobics, and biking. General instructions Take over-the-counter   and prescription medicines only as told by your health care provider. Wear a night splint while sleeping, if told by your health care provider. Loosen the splint if your toes tingle, become numb, or turn cold and blue. Maintain  a healthy weight, or work with your health care provider to lose weight as needed. Keep all follow-up visits. This is important. Contact a health care provider if you have: Symptoms that do not go away with home treatment. Pain that gets worse. Pain that affects your ability to move or do daily activities. Summary Plantar fasciitis is a painful foot condition that affects the heel. It occurs when the band of tissue that connects the toes to the heel bone (plantar fascia) becomes irritated. Heel pain is the main symptom of this condition. It may get worse after exercising too much or standing still for a long time. Treatment varies, but it usually starts with rest, ice, pressure (compression), and raising (elevating) the affected foot. This is called RICE therapy. Over-the-counter medicines can also be used to manage pain. This information is not intended to replace advice given to you by your health care provider. Make sure you discuss any questions you have with your healthcare provider. Document Revised: 09/23/2019 Document Reviewed: 09/23/2019 Elsevier Patient Education  2022 [employer]  

## 2020-12-14 NOTE — Progress Notes (Addendum)
[facility]   Subjective:   [name] is a 55 y.o. female presents for hospital follow up a. Admit date to the hospital was 11/28/20, patient was discharged from the hospital on 11/30/20, patient was admitted for: Right sided numbness and Stroke (cerebrum) ([place]) Resolved.  She continues to have paresthesias on the right side involving her hand and legs.  She has a neurology appointment scheduled reports that it is 3:00 today with Dr. Leonie Man.Hypertension stable -Denies shortness of breath, headaches, chest pain or lower extremity edema.  Past Medical History:  Diagnosis Date   ADD (attention deficit disorder)    Allergy    Anemia    Arthritis    OA back    Cyst of right ovary    Depression    Heart murmur    History of kidney stones    Hypertension    Neuromuscular disorder (HCC)    RLS   Restless leg syndrome    Seizures (HCC)    in her 20's- none since-? etiology      Allergies  Allergen Reactions   Ciprofloxacin Swelling   Levaquin [Levofloxacin] Anaphylaxis    Hives and throat swelling    Amoxil [Amoxicillin] Rash    Extreme itching limbs and trunk    Dynacin [Minocycline] Rash   Sulfamethazine Rash      Current Outpatient Medications on File Prior to Visit  Medication Sig Dispense Refill   acetaminophen (TYLENOL) 325 MG tablet Take 650 mg by mouth every 6 (six) hours as needed for moderate pain or headache.     aspirin 81 MG chewable tablet Chew 1 tablet (81 mg total) by mouth daily for 21 days. 21 tablet 0   atorvastatin (LIPITOR) 80 MG tablet Take 1 tablet (80 mg total) by mouth daily at 6 PM. 90 tablet 1   clopidogrel (PLAVIX) 75 MG tablet Take 1 tablet (75 mg total) by mouth daily. 60 tablet 3   escitalopram (LEXAPRO) 10 MG tablet TAKE 1 TABLET (10 MG TOTAL) BY MOUTH DAILY. 90 tablet 1   gabapentin (NEURONTIN) 300 MG capsule Take 1 capsule by mouth in the morning, 1 capsule in the afternoon and 2 capsules in the evening. (Patient taking  differently: Take 300 mg by mouth 3 (three) times daily.) 120 capsule 5   losartan (COZAAR) 25 MG tablet Take 1 tablet (25 mg total) by mouth daily. 90 tablet 1   Multiple Vitamin (MULTIVITAMIN) tablet Take 1 tablet by mouth daily.     No current facility-administered medications on file prior to visit.     Review of System: Review of Systems  Musculoskeletal:        Paresthesia in hand, neuropathy right hand on gabapentin not working as well but decreased her hot flashes defer to neurology    Neurological:  Positive for tingling and weakness.       Right side  Sometimes states the opposite and forgets common words.  Psychiatric/Behavioral:  Positive for depression.   All other systems reviewed and are negative.   Objective:  BP 116/78 (BP Location: Left Arm, Patient Position: Sitting, Cuff Size: Large)   Pulse 60   Temp (!) 97.3 F (36.3 C)   Resp 16   Wt 214 lb (97.1 kg)   SpO2 96%   BMI 33.52 kg/m   Filed Weights   12/14/20 0939  Weight: 214 lb (97.1 kg)    Physical Exam: General Appearance: Well nourished, obese female in no apparent distress. Eyes: PERRLA, EOMs, conjunctiva  no swelling or erythema Sinuses: No Frontal/maxillary tenderness ENT/Mouth: Ext aud canals clear, TMs without erythema, bulging. Hearing normal.  Neck: Supple, thyroid normal.  Respiratory: Respiratory effort normal, BS equal bilaterally without rales, rhonchi, wheezing or stridor.  Cardio: RRR with no MRGs. Brisk peripheral pulses without edema.  Abdomen: Soft, + BS.  Non tender, no guarding, rebound, hernias, masses. Lymphatics: Non tender without lymphadenopathy.  Musculoskeletal: Full ROM, 5/5 strength, normal gait.  Skin: Warm, dry without rashes, lesions, ecchymosis.  Neuro: Cranial nerves intact. Normal muscle tone, no cerebellar symptoms. Sensation intact.  Psych: Awake and oriented X 3, normal affect, Insight and Judgment appropriate.   Assessment:  Diagnoses and all orders for this  visit:  Genital herpes simplex, unspecified site -     valACYclovir (VALTREX) 500 MG tablet; Take 0.5 tablets (250 mg total) by mouth daily.  Encounter for screening mammogram for malignant neoplasm of breast Patient completed application for BCCP while in clinic and application has and faxed to [facility]. Patient aware that [facility] will contact her directly to schedule appointment.   Hospital discharge follow-up Discharge recommendations Follow-Ups: Schedule an appointment with Garvin Fila, MD (Neurology) appointment today at 3pm   Class 1 obesity due to excess calories with body mass index (BMI) of 33.0 to 33.9 in adult, unspecified whether serious comorbidity present Obesity is 30-39 indicating an excess in caloric intake or underlining conditions. This may lead to other co-morbidities. Lifestyle modifications of diet and exercise may reduce obesity.  She is working very hard on her weight and is losing 8 an appropriate way monitoring carbohydrates and exercising down 6 pounds from previous.  Scales at home state her weight is 212 reminded her oral skills are difficult to use her to keep track of weight same time preferably nothing on  Benign essential hypertension Well controlled on Cozaar 25mg  daily  blood pressure is at goal of less than 130/80, low-sodium, DASH diet, medication compliance, 150 minutes of moderate intensity exercise per week. Discussed medication compliance, adverse effects.  Medication refill -     valACYclovir (VALTREX) 500 MG tablet; Take 0.5 tablets (250 mg total) by mouth daily.   Plantar fasciitis Tries frozen bottle and rolling over it little relief - information provided on AVS refer to ortho   Breast cancer screening Patient completed application for BCCP while in clinic and application has and faxed to [facility]. Patient aware that [facility] will contact her directly to schedule appointment.   Meds ordered this encounter  Medications   valACYclovir (VALTREX) 500 MG  tablet    Sig: Take 0.5 tablets (250 mg total) by mouth daily.    Dispense:  90 tablet    Refill:  3    This note has been created with Surveyor, quantity. Any transcriptional errors are unintentional.   Kerin Perna, NP 12/14/2020, 10:12 AM

## 2020-12-15 ENCOUNTER — Ambulatory Visit: Payer: Self-pay

## 2020-12-15 ENCOUNTER — Encounter: Payer: Self-pay | Admitting: Orthopedic Surgery

## 2020-12-15 ENCOUNTER — Ambulatory Visit (INDEPENDENT_AMBULATORY_CARE_PROVIDER_SITE_OTHER): Payer: Self-pay

## 2020-12-15 ENCOUNTER — Ambulatory Visit (INDEPENDENT_AMBULATORY_CARE_PROVIDER_SITE_OTHER): Payer: Self-pay | Admitting: Physician Assistant

## 2020-12-15 DIAGNOSIS — M79671 Pain in right foot: Secondary | ICD-10-CM

## 2020-12-15 DIAGNOSIS — M79672 Pain in left foot: Secondary | ICD-10-CM

## 2020-12-15 MED ORDER — LIDOCAINE HCL 1 % IJ SOLN
2.0000 mL | INTRAMUSCULAR | Status: AC | PRN
  Administered 2020-12-15: 2 mL

## 2020-12-15 MED ORDER — METHYLPREDNISOLONE ACETATE 40 MG/ML IJ SUSP
40.0000 mg | INTRAMUSCULAR | Status: AC | PRN
  Administered 2020-12-15: 40 mg

## 2020-12-15 NOTE — Progress Notes (Signed)
Office Visit Note   Patient: Sarah Simpson           Date of Birth: 08/05/1965           MRN: [id number] Visit Date: 12/15/2020              Requested by: Kerin Perna, NP [address] [place],  [place] [address] PCP: Kerin Perna, NP  Chief Complaint  Patient presents with  . Left Foot - Pain  . Right Foot - Pain      HPI: Patient is a pleasant 55 year old woman who complains of right greater than left heel pain.  She has a history of bilateral plantar fasciitis for several years.  She has done extensive stretching tried to adjust her shoe wear.  She did have an injection 2 years ago that seemed to help.  She thinks this was only her second injection.  She would like to go forward today if possible with bilateral heel injections.  She is also asking about to wear  Assessment & Plan: Visit Diagnoses:  1. Pain in right foot   2. Pain in left foot     Plan: Patient will be given bilateral plantar fascia injections today.  Given her information regarding [place] shoes and sole orthotics she is not interested in any surgical options at this time  Follow-Up Instructions: No follow-ups on file.   Ortho Exam  Patient is alert, oriented, no adenopathy, well-dressed, normal affect, normal respiratory effort. Bilateral feet no swelling no cellulitis she is focally tender over the medial plantar insertion of the plantar fascia right greater than left.  No subtalar no midfoot stiffness or pain.  She is status post bunion correction and have some prominence of one of the pins but this has been stable and does not break the skin palpable dorsalis pedis pulses bilaterally  Imaging: No results found. No images are attached to the encounter.  Labs: Lab Results  Component Value Date   HGBA1C 6.3 (H) 11/29/2020   HGBA1C 5.7 (H) 09/16/2020   HGBA1C 5.6 08/05/2013   ESRSEDRATE 12 09/16/2020   CRP 0.6 09/16/2020     Lab Results  Component Value Date   ALBUMIN 3.9  11/28/2020   ALBUMIN 4.2 09/15/2020   ALBUMIN 3.6 02/16/2017    No results found for: MG No results found for: VD25OH  No results found for: PREALBUMIN CBC EXTENDED Latest Ref Rng & Units 11/28/2020 11/28/2020 09/16/2020  WBC 4.0 - 10.5 K/uL - 7.2 4.9  RBC 3.87 - 5.11 MIL/uL - 4.54 4.14  HGB 12.0 - 15.0 g/dL 14.3 14.1 13.2  HCT 36.0 - 46.0 % 42.0 42.2 38.7  PLT 150 - 400 K/uL - 238 241  NEUTROABS 1.7 - 7.7 K/uL - 3.6 -  LYMPHSABS 0.7 - 4.0 K/uL - 2.6 -     There is no height or weight on file to calculate BMI.  Orders:  Orders Placed This Encounter  Procedures  . XR Foot 2 Views Left  . XR Foot 2 Views Right   No orders of the defined types were placed in this encounter.    Procedures: Foot Inj  Date/Time: 12/15/2020 2:23 PM Performed by: Kyana Aicher, Bevely Palmer, PA Authorized by: Jovie Swanner, Bevely Palmer, [place]   Consent Given by:  Patient Site marked: the procedure site was marked   Timeout: prior to procedure the correct patient, procedure, and site was verified   Indications:  Fasciitis and pain Condition: Plantar Fasciitis   Location: left plantar  fascia muscle and right plantar fascia muscle   Prep: patient was prepped and draped in usual sterile fashion   Needle Size:  22 G Medications:  2 mL lidocaine 1 %; 40 mg methylPREDNISolone acetate 40 MG/ML Patient Tolerance:  Patient tolerated the procedure well with no immediate complications  Clinical Data: No additional findings.  ROS:  All other systems negative, except as noted in the HPI. Review of Systems  Objective: Vital Signs: There were no vitals taken for this visit.  Specialty Comments:  No specialty comments available.  PMFS History: Patient Active Problem List   Diagnosis Date Noted  . Right sided numbness 11/30/2020  . Family history of malignant neoplasm of gastrointestinal tract 10/02/2020  . Genital herpes simplex 10/02/2020  . Menopause 10/02/2020  . Moderate recurrent major depression ([place])  10/02/2020  . New daily persistent headache 10/02/2020  . Obesity 10/02/2020  . Prediabetes 10/02/2020  . Acute CVA (cerebrovascular accident) ([place]) 09/15/2020  . Benign essential hypertension 09/15/2020   Past Medical History:  Diagnosis Date  . ADD (attention deficit disorder)   . Allergy   . Anemia   . Arthritis    OA back   . Cyst of right ovary   . Depression   . Heart murmur   . History of kidney stones   . Hypertension   . Neuromuscular disorder (HCC)    RLS  . Restless leg syndrome   . Seizures ([place])    in her 71's- none since-? etiology     Family History  Problem Relation Age of Onset  . Colon cancer Father 34  . Colon cancer Mother 62  . Heart attack Mother   . Dementia Mother   . Colon polyps Neg Hx   . Esophageal cancer Neg Hx   . Stomach cancer Neg Hx   . Rectal cancer Neg Hx     Past Surgical History:  Procedure Laterality Date  . APPENDECTOMY  2008  . BUBBLE STUDY  09/18/2020   Procedure: BUBBLE STUDY;  Surgeon: Fay Records, MD;  Location: [facility];  Service: Cardiovascular;;  . [place] Anes  2009   x 2  . TEE WITHOUT CARDIOVERSION N/A 09/18/2020   Procedure: TRANSESOPHAGEAL ECHOCARDIOGRAM (TEE);  Surgeon: Fay Records, MD;  Location: [facility];  Service: Cardiovascular;  Laterality: N/A;  . WISDOM TOOTH EXTRACTION     Social History   Occupational History  . Not on file  Tobacco Use  . Smoking status: Former    Pack years: 0.00  . Smokeless tobacco: Never  . Tobacco comments:    on and off in the past, quit in 2002  Vaping Use  . Vaping Use: Never used  Substance and Sexual Activity  . Alcohol use: Yes    Comment: occ  . Drug use: Never  . Sexual activity: Not on file

## 2020-12-16 ENCOUNTER — Other Ambulatory Visit: Payer: Self-pay | Admitting: Primary Care

## 2020-12-16 DIAGNOSIS — Z1231 Encounter for screening mammogram for malignant neoplasm of breast: Secondary | ICD-10-CM

## 2020-12-23 ENCOUNTER — Telehealth: Payer: Self-pay | Admitting: Neurology

## 2020-12-23 NOTE — Telephone Encounter (Signed)
12/23/20 lvm for pt letting her know order was sent to [facility] for scheduling

## 2021-01-11 ENCOUNTER — Other Ambulatory Visit: Payer: Self-pay

## 2021-01-12 ENCOUNTER — Encounter: Payer: Self-pay | Admitting: Neurology

## 2021-01-12 ENCOUNTER — Other Ambulatory Visit: Payer: Self-pay

## 2021-01-25 ENCOUNTER — Other Ambulatory Visit: Payer: Self-pay

## 2021-01-25 ENCOUNTER — Ambulatory Visit (INDEPENDENT_AMBULATORY_CARE_PROVIDER_SITE_OTHER): Payer: Self-pay | Admitting: Neurology

## 2021-01-25 DIAGNOSIS — R2990 Unspecified symptoms and signs involving the nervous system: Secondary | ICD-10-CM

## 2021-01-25 NOTE — Progress Notes (Signed)
EEG completed at [facility] with photic stimulation without hyperventilation.   Full report to follow by Dr. Royal Hawthorn

## 2021-01-28 ENCOUNTER — Ambulatory Visit
Admission: RE | Admit: 2021-01-28 | Discharge: 2021-01-28 | Disposition: A | Discharge status: Home / Self Care | Payer: No Typology Code available for payment source | Source: Ambulatory Visit | Attending: Primary Care | Admitting: Primary Care

## 2021-01-28 ENCOUNTER — Other Ambulatory Visit: Payer: Self-pay

## 2021-01-28 DIAGNOSIS — Z1231 Encounter for screening mammogram for malignant neoplasm of breast: Secondary | ICD-10-CM

## 2021-02-01 ENCOUNTER — Other Ambulatory Visit: Payer: Self-pay

## 2021-02-01 ENCOUNTER — Ambulatory Visit (INDEPENDENT_AMBULATORY_CARE_PROVIDER_SITE_OTHER): Payer: Self-pay | Admitting: Primary Care

## 2021-02-10 NOTE — Procedures (Signed)
   HISTORY: 55 year old  presented with seizure like spell.  TECHNIQUE:  This is a routine 16 channel EEG recording with one channel devoted to a limited EKG recording.  It was performed during wakefulness, drowsiness and asleep.  Hyperventilation and photic stimulation were performed as activating procedures.  There are minimum muscle and movement artifact noted.  Upon maximum arousal, posterior dominant waking rhythm consistent of rhythmic alpha range activity, with frequency of 10 Hz. Activities are symmetric over the bilateral posterior derivations and attenuated with eye opening.  Hyperventilation produced mild/moderate buildup with higher amplitude and the slower activities noted.  Photic stimulation did not alter the tracing.  During EEG recording, patient developed drowsiness and no deep stage of sleep was achieved.  During EEG recording, there was no epileptiform discharge noted.  EKG demonstrate sinus rhythm, with heart rate of 84 beats per minute.  CONCLUSION: This is a  normal awake EEG.  There is no electrodiagnostic evidence of epileptiform discharge.  Marcial Pacas, M.D. Ph.D.  [facility] [place], [place] [address] Phone: [phone] Fax:      [phone]

## 2021-02-11 ENCOUNTER — Telehealth: Payer: Self-pay

## 2021-02-11 NOTE — Telephone Encounter (Signed)
-----   Message from Garvin Fila, MD sent at 02/10/2021  5:25 PM EDT ----- Kindly inform the patient that EEG study was normal.  No seizure activity noted ----- Message ----- From: Marcial Pacas, MD Sent: 02/10/2021   8:49 AM EDT To: Garvin Fila, MD

## 2021-02-11 NOTE — Telephone Encounter (Signed)
My chart message sent to pt.

## 2021-02-19 ENCOUNTER — Encounter: Payer: Self-pay | Admitting: *Deleted

## 2021-02-19 NOTE — Progress Notes (Unsigned)
[facility] - 02/19/21 0900       Assessment    Assessment type Phone to patient    Is patient still in hospital? No    Date of hospital discharge after thrombolysis? 11/30/20      Final 90-Day Modified Rankin Score   Final 90-Day Modified Rankin Score: ([place]) 1-Some symptoms from stroke remain, but able to carry out all usual activities      [id number]   Mobility 1- no problems in walking about    Self-care 1- no problems with Self-care    Usual activities 1- no problems with performing usual activities (e.g. work, study, housework, family or leisure activities)    Pain/discomfort 1- no pain or discomfort    Anxiety/Depression 2- slight anxious or depressed    What number between 0-100 best describes the patient's health state today (100 means the best health; 0 means the worst health)? [facility] Admission   In the Past 3 months (since your initial hospitalisation for stroke), have you been admitted to hospital (including day-only procedures) for any reason? No      Doctor consultations   In the past 3 months (since your initial hospitalisation for stroke), have you seen any doctors or other health professional (for example physiotherapy, outpatient nurse, general practitioner) for any reason? Yes      a. Doctor consultations   a. Type of service Internal Medicine    a. Condition or purpose Genital Herpes Simplex    a. Date of appointment 12/14/20      b. Doctor consultations   b. Type of service Neurology    b. Condition or purpose Stroke follow up    b. Date of appointment 12/14/20      c. Doctor consultations   c. Type of service Orthopedics    c. Condition or purpose right foot pain    c. Date of appointment 12/15/20

## 2021-02-25 ENCOUNTER — Ambulatory Visit: Payer: MEDICAID | Admitting: Adult Health

## 2021-03-01 ENCOUNTER — Other Ambulatory Visit: Payer: Self-pay

## 2021-03-01 ENCOUNTER — Ambulatory Visit
Admission: EM | Admit: 2021-03-01 | Discharge: 2021-03-01 | Disposition: A | Discharge status: Home / Self Care | Payer: Self-pay | Attending: Urgent Care | Admitting: Urgent Care

## 2021-03-01 ENCOUNTER — Ambulatory Visit (INDEPENDENT_AMBULATORY_CARE_PROVIDER_SITE_OTHER): Payer: Self-pay

## 2021-03-01 ENCOUNTER — Encounter: Payer: Self-pay | Admitting: Emergency Medicine

## 2021-03-01 DIAGNOSIS — Z87891 Personal history of nicotine dependence: Secondary | ICD-10-CM

## 2021-03-01 DIAGNOSIS — R059 Cough, unspecified: Secondary | ICD-10-CM

## 2021-03-01 DIAGNOSIS — J018 Other acute sinusitis: Secondary | ICD-10-CM

## 2021-03-01 DIAGNOSIS — R0989 Other specified symptoms and signs involving the circulatory and respiratory systems: Secondary | ICD-10-CM

## 2021-03-01 MED ORDER — CLINDAMYCIN HCL 300 MG PO CAPS
300.0000 mg | ORAL_CAPSULE | Freq: Three times a day (TID) | ORAL | 0 refills | Status: DC
  Filled 2021-03-01: qty 21, 7d supply, fill #0

## 2021-03-01 MED ORDER — PREDNISONE 20 MG PO TABS
ORAL_TABLET | ORAL | 0 refills | Status: DC
  Filled 2021-03-01: qty 10, 5d supply, fill #0

## 2021-03-01 MED ORDER — CEFDINIR 300 MG PO CAPS
300.0000 mg | ORAL_CAPSULE | Freq: Two times a day (BID) | ORAL | 0 refills | Status: DC
  Filled 2021-03-01: qty 14, 7d supply, fill #0

## 2021-03-01 NOTE — ED Triage Notes (Signed)
Patient c/o possible sinus infection, headache, facial pain/pressure, swollen lymph nodes.  Taken OTC allergy and sinus meds.  Patient is vaccinated for COVID.

## 2021-03-01 NOTE — ED Provider Notes (Signed)
[place]   MRN: [id number] DOB: Oct 14, 1965  Subjective:   Sarah Simpson is a 55 y.o. female presenting for 3-week history of persistent sinus congestion, sinus pressure and facial pain, sinus headaches, persistent coughing, crackles in her lungs, chest congestion.  Patient has about a 15 pack year history of smoking.  She quit smoking about 10 years ago.  She did a COVID test at home and was negative.  No current facility-administered medications for this encounter.  Current Outpatient Medications:    acetaminophen (TYLENOL) 325 MG tablet, Take 650 mg by mouth every 6 (six) hours as needed for moderate pain or headache., Disp: , Rfl:    atorvastatin (LIPITOR) 80 MG tablet, Take 1 tablet (80 mg total) by mouth daily at 6 PM., Disp: 90 tablet, Rfl: 1   clopidogrel (PLAVIX) 75 MG tablet, Take 1 tablet (75 mg total) by mouth daily., Disp: 60 tablet, Rfl: 3   escitalopram (LEXAPRO) 10 MG tablet, TAKE 1 TABLET (10 MG TOTAL) BY MOUTH DAILY., Disp: 90 tablet, Rfl: 1   losartan (COZAAR) 25 MG tablet, Take 1 tablet (25 mg total) by mouth daily., Disp: 90 tablet, Rfl: 1   Multiple Vitamin (MULTIVITAMIN) tablet, Take 1 tablet by mouth daily., Disp: , Rfl:    topiramate (TOPAMAX) 25 MG tablet, Take 1 tablet (25 mg total) by mouth 2 (two) times daily., Disp: 120 tablet, Rfl: 3   valACYclovir (VALTREX) 500 MG tablet, Take 0.5 tablets (250 mg total) by mouth daily., Disp: 90 tablet, Rfl: 3   Allergies  Allergen Reactions   Ciprofloxacin Swelling   Levaquin [Levofloxacin] Anaphylaxis    Hives and throat swelling    Amoxil [Amoxicillin] Rash    Extreme itching limbs and trunk    Dynacin [Minocycline] Rash   Sulfamethazine Rash    Past Medical History:  Diagnosis Date   ADD (attention deficit disorder)    Allergy    Anemia    Arthritis    OA back    Cyst of right ovary    Depression    Heart murmur    History of kidney stones    Hypertension    Neuromuscular disorder  (HCC)    RLS   Restless leg syndrome    Seizures ([place])    in her 20's- none since-? etiology      Past Surgical History:  Procedure Laterality Date   APPENDECTOMY  2008   BUBBLE STUDY  09/18/2020   Procedure: BUBBLE STUDY;  Surgeon: Fay Records, MD;  Location: [facility] ENDOSCOPY;  Service: Cardiovascular;;   BUNIONECTOMY  2009   x 2   TEE WITHOUT CARDIOVERSION N/A 09/18/2020   Procedure: TRANSESOPHAGEAL ECHOCARDIOGRAM (TEE);  Surgeon: Fay Records, MD;  Location: [facility] ENDOSCOPY;  Service: Cardiovascular;  Laterality: N/A;   WISDOM TOOTH EXTRACTION      Family History  Problem Relation Age of Onset   Colon cancer Father 81   Colon cancer Mother 39   Heart attack Mother    Dementia Mother    Colon polyps Neg Hx    Esophageal cancer Neg Hx    Stomach cancer Neg Hx    Rectal cancer Neg Hx     Social History   Tobacco Use   Smoking status: Former   Smokeless tobacco: Never   Tobacco comments:    on and off in the past, quit in 2002  Vaping Use   Vaping Use: Never used  Substance Use Topics   Alcohol use: Yes  Comment: occ   Drug use: Never    ROS   Objective:   Vitals: BP 128/84 (BP Location: Left Arm)   Pulse 69   Temp 98.6 F (37 C) (Oral)   Ht 5' 6.5" (1.689 m)   Wt 220 lb (99.8 kg)   SpO2 96%   BMI 34.98 kg/m   Physical Exam Constitutional:      General: She is not in acute distress.    Appearance: Normal appearance. She is well-developed. She is not ill-appearing, toxic-appearing or diaphoretic.  HENT:     Head: Normocephalic and atraumatic.     Nose: Nose normal.     Mouth/Throat:     Mouth: Mucous membranes are moist.  Eyes:     Extraocular Movements: Extraocular movements intact.     Pupils: Pupils are equal, round, and reactive to light.  Cardiovascular:     Rate and Rhythm: Normal rate and regular rhythm.     Pulses: Normal pulses.     Heart sounds: Normal heart sounds. No murmur heard.   No friction rub. No gallop.  Pulmonary:     Effort:  Pulmonary effort is normal. No respiratory distress.     Breath sounds: Normal breath sounds. No stridor. No wheezing, rhonchi or rales.  Skin:    General: Skin is warm and dry.     Findings: No rash.  Neurological:     Mental Status: She is alert and oriented to person, place, and time.  Psychiatric:        Mood and Affect: Mood normal.        Behavior: Behavior normal.        Thought Content: Thought content normal.        Judgment: Judgment normal.   DG Chest 2 View  Result Date: 03/01/2021 CLINICAL DATA:  Cough, chest congestion EXAM: CHEST - 2 VIEW COMPARISON:  02/16/2018 FINDINGS: The heart size and mediastinal contours are within normal limits. Both lungs are clear. The visualized skeletal structures are unremarkable. IMPRESSION: No active cardiopulmonary disease. Electronically Signed   By: Randa Ngo M.D.   On: 03/01/2021 15:34    Assessment and Plan :   PDMP not reviewed this encounter.  1. Acute non-recurrent sinusitis of other sinus   2. Cough   3. Chest congestion   4. Former smoker     Will start empiric treatment for sinusitis with cefdinir, clindamycin given allergies to amoxicillin and doxycycline.  In light of her persistent 3-week history of symptoms recommended an oral prednisone course.  Recommended supportive care otherwise including the use of oral antihistamine long-term together with Flonase when she is done with the oral prednisone course. Counseled patient on potential for adverse effects with medications prescribed/recommended today, ER and return-to-clinic precautions discussed, patient verbalized understanding.    Jaynee Eagles, PA-C 03/01/21 1552

## 2021-03-05 ENCOUNTER — Other Ambulatory Visit: Payer: Self-pay

## 2021-03-05 NOTE — Patient Outreach (Signed)
[name] Care Management  03/05/2021  Sarah Simpson June 21, 1965 [id number]   No Telephone outreach to patient to obtain mRS. Score was successfully completed by Hollice Espy on 02/19/21. MRS= 1  Thank you, [place] Care Management Assistant

## 2021-04-01 ENCOUNTER — Encounter (INDEPENDENT_AMBULATORY_CARE_PROVIDER_SITE_OTHER): Payer: Self-pay | Admitting: Primary Care

## 2021-04-01 ENCOUNTER — Ambulatory Visit (INDEPENDENT_AMBULATORY_CARE_PROVIDER_SITE_OTHER): Payer: Self-pay | Admitting: Primary Care

## 2021-04-01 ENCOUNTER — Other Ambulatory Visit: Payer: Self-pay

## 2021-04-01 VITALS — BP 115/78 | HR 55 | Temp 97.6°F | Ht 66.5 in | Wt 207.4 lb

## 2021-04-01 DIAGNOSIS — Z23 Encounter for immunization: Secondary | ICD-10-CM

## 2021-04-01 DIAGNOSIS — Z760 Encounter for issue of repeat prescription: Secondary | ICD-10-CM

## 2021-04-01 DIAGNOSIS — I10 Essential (primary) hypertension: Secondary | ICD-10-CM

## 2021-04-01 DIAGNOSIS — E782 Mixed hyperlipidemia: Secondary | ICD-10-CM

## 2021-04-01 MED ORDER — LOSARTAN POTASSIUM 25 MG PO TABS
25.0000 mg | ORAL_TABLET | Freq: Every day | ORAL | 1 refills | Status: DC
  Filled 2021-04-01 – 2021-04-04 (×2): qty 90, 90d supply, fill #0
  Filled 2021-05-06 – 2021-05-14 (×3): qty 30, 30d supply, fill #0
  Filled 2021-06-13: qty 30, 30d supply, fill #1
  Filled ????-??-??: fill #0

## 2021-04-01 NOTE — Patient Instructions (Signed)
Influenza, Adult °Influenza is also called "the flu." It is an infection in the lungs, nose, and throat (respiratory tract). It spreads easily from person to person (is contagious). The flu causes symptoms that are like a cold, along with high fever and body aches. °What are the causes? °This condition is caused by the influenza virus. You can get the virus by: °Breathing in droplets that are in the air after a person infected with the flu coughed or sneezed. °Touching something that has the virus on it and then touching your mouth, nose, or eyes. °What increases the risk? °Certain things may make you more likely to get the flu. These include: °Not washing your hands often. °Having close contact with many people during cold and flu season. °Touching your mouth, eyes, or nose without first washing your hands. °Not getting a flu shot every year. °You may have a higher risk for the flu, and serious problems, such as a lung infection (pneumonia), if you: °Are older than 65. °Are pregnant. °Have a weakened disease-fighting system (immune system) because of a disease or because you are taking certain medicines. °Have a long-term (chronic) condition, such as: °Heart, kidney, or lung disease. °Diabetes. °Asthma. °Have a liver disorder. °Are very overweight (morbidly obese). °Have anemia. °What are the signs or symptoms? °Symptoms usually begin suddenly and last 4-14 days. They may include: °Fever and chills. °Headaches, body aches, or muscle aches. °Sore throat. °Cough. °Runny or stuffy (congested) nose. °Feeling discomfort in your chest. °Not wanting to eat as much as normal. °Feeling weak or tired. °Feeling dizzy. °Feeling sick to your stomach or throwing up. °How is this treated? °If the flu is found early, you can be treated with antiviral medicine. This can help to reduce how bad the illness is and how long it lasts. This may be given by mouth or through an IV tube. °Taking care of yourself at home can help your  symptoms get better. Your doctor may want you to: °Take over-the-counter medicines. °Drink plenty of fluids. °The flu often goes away on its own. If you have very bad symptoms or other problems, you may be treated in a hospital. °Follow these instructions at home: °  °Activity °Rest as needed. Get plenty of sleep. °Stay home from work or school as told by your doctor. °Do not leave home until you do not have a fever for 24 hours without taking medicine. °Leave home only to go to your doctor. °Eating and drinking °Take an ORS (oral rehydration solution). This is a drink that is sold at pharmacies and stores. °Drink enough fluid to keep your pee pale yellow. °Drink clear fluids in small amounts as you are able. Clear fluids include: °Water. °Ice chips. °Fruit juice mixed with water. °Low-calorie sports drinks. °Eat bland foods that are easy to digest. Eat small amounts as you are able. These foods include: °Bananas. °Applesauce. °Rice. °Lean meats. °Toast. °Crackers. °Do not eat or drink: °Fluids that have a lot of sugar or caffeine. °Alcohol. °Spicy or fatty foods. °General instructions °Take over-the-counter and prescription medicines only as told by your doctor. °Use a cool mist humidifier to add moisture to the air in your home. This can make it easier for you to breathe. °When using a cool mist humidifier, clean it daily. Empty water and replace with clean water. °Cover your mouth and nose when you cough or sneeze. °Wash your hands with soap and water often and for at least 20 seconds. This is also important after   you cough or sneeze. If you cannot use soap and water, use alcohol-based hand sanitizer. °Keep all follow-up visits. °How is this prevented? ° °Get a flu shot every year. You may get the flu shot in late summer, fall, or winter. Ask your doctor when you should get your flu shot. °Avoid contact with people who are sick during fall and winter. This is cold and flu season. °Contact a doctor if: °You get  new symptoms. °You have: °Chest pain. °Watery poop (diarrhea). °A fever. °Your cough gets worse. °You start to have more mucus. °You feel sick to your stomach. °You throw up. °Get help right away if you: °Have shortness of breath. °Have trouble breathing. °Have skin or nails that turn a bluish color. °Have very bad pain or stiffness in your neck. °Get a sudden headache. °Get sudden pain in your face or ear. °Cannot eat or drink without throwing up. °These symptoms may represent a serious problem that is an emergency. Get medical help right away. Call your local emergency services (911 in the [place] °Do not wait to see if the symptoms will go away. °Do not drive yourself to the hospital. °Summary °Influenza is also called "the flu." It is an infection in the lungs, nose, and throat. It spreads easily from person to person. °Take over-the-counter and prescription medicines only as told by your doctor. °Getting a flu shot every year is the best way to not get the flu. °This information is not intended to replace advice given to you by your health care provider. Make sure you discuss any questions you have with your health care provider. °Document Revised: 01/24/2020 Document Reviewed: 01/24/2020 °Elsevier Patient Education © 2022 [employer] ° °

## 2021-04-01 NOTE — Progress Notes (Signed)
[facility]   Sarah Simpson is a 55 y.o. female presents for hypertension evaluation, Denies shortness of breath, headaches, chest pain or lower extremity edema, sudden onset, vision changes, unilateral weakness, dizziness, paresthesias   Patient reports adherence with medications.  Dietary habits include: monitor sodium and carbs  Exercise habits include:yes  Family / Social history: Father T2D, CVD   Past Medical History:  Diagnosis Date   ADD (attention deficit disorder)    Allergy    Anemia    Arthritis    OA back    Cyst of right ovary    Depression    Heart murmur    History of kidney stones    Hypertension    Neuromuscular disorder (HCC)    RLS   Restless leg syndrome    Seizures (HCC)    in her 20's- none since-? etiology    Past Surgical History:  Procedure Laterality Date   APPENDECTOMY  2008   BUBBLE STUDY  09/18/2020   Procedure: BUBBLE STUDY;  Surgeon: Sarah Riffle, MD;  Location: [facility] ENDOSCOPY;  Service: Cardiovascular;;   BUNIONECTOMY  2009   x 2   TEE WITHOUT CARDIOVERSION N/A 09/18/2020   Procedure: TRANSESOPHAGEAL ECHOCARDIOGRAM (TEE);  Surgeon: Sarah Riffle, MD;  Location: [facility] ENDOSCOPY;  Service: Cardiovascular;  Laterality: N/A;   WISDOM TOOTH EXTRACTION     Allergies  Allergen Reactions   Ciprofloxacin Swelling   Levaquin [Levofloxacin] Anaphylaxis    Hives and throat swelling    Amoxil [Amoxicillin] Rash    Extreme itching limbs and trunk    Dynacin [Minocycline] Rash   Sulfamethazine Rash   Current Outpatient Medications on File Prior to Visit  Medication Sig Dispense Refill   acetaminophen (TYLENOL) 325 MG tablet Take 650 mg by mouth every 6 (six) hours as needed for moderate pain or headache.     atorvastatin (LIPITOR) 80 MG tablet Take 1 tablet (80 mg total) by mouth daily at 6 PM. 90 tablet 1   cefdinir (OMNICEF) 300 MG capsule Take 1 capsule (300 mg total) by mouth 2 (two) times daily. 14 capsule 0   clopidogrel  (PLAVIX) 75 MG tablet Take 1 tablet (75 mg total) by mouth daily. 60 tablet 3   escitalopram (LEXAPRO) 10 MG tablet TAKE 1 TABLET (10 MG TOTAL) BY MOUTH DAILY. 90 tablet 1   losartan (COZAAR) 25 MG tablet Take 1 tablet (25 mg total) by mouth daily. 90 tablet 1   Multiple Vitamin (MULTIVITAMIN) tablet Take 1 tablet by mouth daily.     topiramate (TOPAMAX) 25 MG tablet Take 1 tablet (25 mg total) by mouth 2 (two) times daily. 120 tablet 3   valACYclovir (VALTREX) 500 MG tablet Take 0.5 tablets (250 mg total) by mouth daily. 90 tablet 3   No current facility-administered medications on file prior to visit.   Social History   Socioeconomic History   Marital status: Single    Spouse name: Not on file   Number of children: Not on file   Years of education: Not on file   Highest education level: Not on file  Occupational History   Not on file  Tobacco Use   Smoking status: Former   Smokeless tobacco: Never   Tobacco comments:    on and off in the past, quit in 2002  Vaping Use   Vaping Use: Never used  Substance and Sexual Activity   Alcohol use: Yes    Comment: occ   Drug use: Never  Sexual activity: Not on file  Other Topics Concern   Not on file  Social History Narrative   Work or School: Biochemist, clinical - [employer]      Home Situation: none      Spiritual Beliefs: Christian      Lifestyle: no regular exercise; so so            Social Determinants of Radio broadcast assistant Strain: Not on file  Food Insecurity: Not on file  Transportation Needs: Not on file  Physical Activity: Not on file  Stress: Not on file  Social Connections: Not on file  Intimate Partner Violence: Not on file   Family History  Problem Relation Age of Onset   Colon cancer Father 8   Colon cancer Mother 63   Heart attack Mother    Dementia Mother    Colon polyps Neg Hx    Esophageal cancer Neg Hx    Stomach cancer Neg Hx    Rectal cancer Neg Hx      OBJECTIVE:  Vitals:   04/01/21  0831  BP: 115/78  Pulse: (!) 55  Temp: 97.6 F (36.4 C)  TempSrc: Oral  SpO2: 98%  Weight: 207 lb 6.4 oz (94.1 kg)  Height: 5' 6.5" (1.689 m)   Physical exam: General: Vital signs reviewed.  Patient is well-developed and well-nourished, obese female in no acute distress and cooperative with exam. Head: Normocephalic and atraumatic. Eyes: EOMI, conjunctivae normal, no scleral icterus. Neck: Supple, trachea midline, normal ROM, no JVD, masses, thyromegaly, or carotid bruit present. Cardiovascular: RRR, S1 normal, S2 normal, no murmurs, gallops, or rubs. Pulmonary/Chest: Clear to auscultation bilaterally, no wheezes, rales, or rhonchi. Abdominal: Soft, non-tender, non-distended, BS +, no masses, organomegaly, or guarding present. Musculoskeletal: No joint deformities, erythema, or stiffness, ROM full and nontender. Extremities: No lower extremity edema bilaterally,  pulses symmetric and intact bilaterally. No cyanosis or clubbing. Neurological: A&O x3, Strength is normal Skin: Warm, dry and intact. No rashes or erythema. Psychiatric: Normal mood and affect. speech and behavior is normal. Cognition and memory are normal.    Review of Systems  All other systems reviewed and are negative.  Last 3 Office BP readings: BP Readings from Last 3 Encounters:  04/01/21 115/78  03/01/21 128/84  12/14/20 113/76    BMET    Component Value Date/Time   NA 141 11/28/2020 2329   K 3.7 11/28/2020 2329   CL 104 11/28/2020 2329   CO2 24 11/28/2020 2316   GLUCOSE 150 (H) 11/28/2020 2329   BUN 19 11/28/2020 2329   CREATININE 0.60 11/28/2020 2329   CALCIUM 9.2 11/28/2020 2316   GFRNONAA >60 11/28/2020 2316   GFRAA >60 02/05/2017 1525    Renal function: CrCl cannot be calculated (Patient's most recent lab result is older than the maximum 21 days allowed.).  Clinical ASCVD: No  The ASCVD Risk score (Arnett DK, et al., 2019) failed to calculate for the following reasons:   The patient has a  prior MI or stroke diagnosis  ASCVD risk factors include- Sarah Simpson   ASSESSMENT & PLAN: Sarah Simpson was seen today for hypertension and weight check.  Diagnoses and all orders for this visit:  Mixed hyperlipidemia  Healthy lifestyle diet of fruits vegetables fish nuts whole grains and low saturated fat . Foods high in cholesterol or liver, fatty meats,cheese, butter avocados, nuts and seeds, chocolate and fried foods. -     Lipid Panel  Benign essential hypertension Counseled on blood pressure goal of  less than 130/80, low-sodium, DASH diet, medication compliance, 150 minutes of moderate intensity exercise per week. Discussed medication compliance, adverse effects.  -     losartan (COZAAR) 25 MG tablet; Take 1 tablet (25 mg total) by mouth daily. -     CMP14+EGFR  Medication refill -     losartan (COZAAR) 25 MG tablet; Take 1 tablet (25 mg total) by mouth daily. -     Lipid Panel  Need for immunization against influenza -     Flu Vaccine QUAD 1mo+IM (Fluarix, Fluzone & Alfiuria Quad PF)   -Counseled on lifestyle modifications for blood pressure control including reduced dietary sodium, increased exercise, weight reduction and adequate sleep. Also, educated patient about the risk for cardiovascular events, stroke and heart attack. Also counseled patient about the importance of medication adherence. If you participate in smoking, it is important to stop using tobacco as this will increase the risks associated with uncontrolled blood pressure.   -Hypertension longstanding diagnosed currently  on current medications. Patient is adherent with current medications.   Goal BP:  For patients younger than 60: Goal BP < 130/80. For patients 60 and older: Goal BP < 140/90. For patients with diabetes: Goal BP < 130/80. Your most recent BP: 115/78  Minimize salt intake. Minimize alcohol intake    This note has been created with Surveyor, quantity. Any  transcriptional errors are unintentional.   Kerin Perna, NP 04/01/2021, 8:41 AM

## 2021-04-02 LAB — CMP14+EGFR
ALT: 22 IU/L (ref 0–32)
AST: 19 IU/L (ref 0–40)
Albumin/Globulin Ratio: 2.1 (ref 1.2–2.2)
Albumin: 4.4 g/dL (ref 3.8–4.9)
Alkaline Phosphatase: 60 IU/L (ref 44–121)
BUN/Creatinine Ratio: 19 (ref 9–23)
BUN: 12 mg/dL (ref 6–24)
Bilirubin Total: 0.6 mg/dL (ref 0.0–1.2)
CO2: 22 mmol/L (ref 20–29)
Calcium: 9.2 mg/dL (ref 8.7–10.2)
Chloride: 109 mmol/L — ABNORMAL HIGH (ref 96–106)
Creatinine, Ser: 0.63 mg/dL (ref 0.57–1.00)
Globulin, Total: 2.1 g/dL (ref 1.5–4.5)
Glucose: 114 mg/dL — ABNORMAL HIGH (ref 70–99)
Potassium: 4.3 mmol/L (ref 3.5–5.2)
Sodium: 141 mmol/L (ref 134–144)
Total Protein: 6.5 g/dL (ref 6.0–8.5)
eGFR: 104 mL/min/{1.73_m2} (ref >59)

## 2021-04-02 LAB — LIPID PANEL
Chol/HDL Ratio: 2.6 ratio (ref 0.0–4.4)
Cholesterol, Total: 108 mg/dL (ref 100–199)
HDL: 42 mg/dL (ref >39)
LDL Chol Calc (NIH): 46 mg/dL (ref 0–99)
Triglycerides: 110 mg/dL (ref 0–149)
VLDL Cholesterol Cal: 20 mg/dL (ref 5–40)

## 2021-04-05 ENCOUNTER — Other Ambulatory Visit: Payer: Self-pay

## 2021-04-12 ENCOUNTER — Other Ambulatory Visit: Payer: Self-pay

## 2021-04-16 ENCOUNTER — Other Ambulatory Visit: Payer: Self-pay

## 2021-05-06 ENCOUNTER — Other Ambulatory Visit (INDEPENDENT_AMBULATORY_CARE_PROVIDER_SITE_OTHER): Payer: Self-pay | Admitting: Primary Care

## 2021-05-06 ENCOUNTER — Other Ambulatory Visit: Payer: Self-pay

## 2021-05-06 DIAGNOSIS — Z760 Encounter for issue of repeat prescription: Secondary | ICD-10-CM

## 2021-05-06 DIAGNOSIS — I639 Cerebral infarction, unspecified: Secondary | ICD-10-CM

## 2021-05-06 MED ORDER — ATORVASTATIN CALCIUM 80 MG PO TABS
80.0000 mg | ORAL_TABLET | Freq: Every day | ORAL | 1 refills | Status: DC
  Filled 2021-05-06: qty 90, 90d supply, fill #0
  Filled 2021-05-14: qty 30, 30d supply, fill #0
  Filled 2021-06-13: qty 30, 30d supply, fill #1

## 2021-05-06 NOTE — Telephone Encounter (Signed)
Requested Prescriptions  Pending Prescriptions Disp Refills  . atorvastatin (LIPITOR) 80 MG tablet 90 tablet 1    Sig: Take 1 tablet (80 mg total) by mouth daily at 6 PM.     Cardiovascular:  Antilipid - Statins Passed - 05/06/2021 10:08 AM      Passed - Total Cholesterol in normal range and within 360 days    Cholesterol, Total  Date Value Ref Range Status  04/01/2021 108 100 - 199 mg/dL Final         Passed - LDL in normal range and within 360 days    LDL Chol Calc (NIH)  Date Value Ref Range Status  04/01/2021 46 0 - 99 mg/dL Final         Passed - HDL in normal range and within 360 days    HDL  Date Value Ref Range Status  04/01/2021 42 >39 mg/dL Final         Passed - Triglycerides in normal range and within 360 days    Triglycerides  Date Value Ref Range Status  04/01/2021 110 0 - 149 mg/dL Final         Passed - Patient is not pregnant      Passed - Valid encounter within last 12 months    Recent Outpatient Visits          1 month ago Benign essential hypertension   [place], Michelle P, NP   4 months ago Genital herpes simplex, unspecified site   [place], Michelle P, NP   6 months ago Need for Tdap vaccination   [place], Michelle P, NP   7 months ago Hospital discharge follow-up   [place], [place], NP   7 years ago Low back pain   [facility], T[place]en Fossa, DO      Future Appointments            In 1 month Oletta Lamas, Milford Cage, NP [place]

## 2021-05-07 ENCOUNTER — Other Ambulatory Visit: Payer: Self-pay

## 2021-05-10 ENCOUNTER — Ambulatory Visit: Payer: Self-pay | Admitting: Adult Health

## 2021-05-17 ENCOUNTER — Other Ambulatory Visit: Payer: Self-pay

## 2021-05-18 ENCOUNTER — Other Ambulatory Visit: Payer: Self-pay

## 2021-05-19 ENCOUNTER — Other Ambulatory Visit: Payer: Self-pay

## 2021-05-20 ENCOUNTER — Ambulatory Visit: Payer: Managed Care, Other (non HMO) | Admitting: Adult Health

## 2021-05-20 ENCOUNTER — Other Ambulatory Visit: Payer: Self-pay

## 2021-05-20 ENCOUNTER — Encounter: Payer: Self-pay | Admitting: Adult Health

## 2021-05-20 VITALS — BP 122/84 | HR 66 | Ht 66.5 in | Wt 204.0 lb

## 2021-05-20 DIAGNOSIS — R202 Paresthesia of skin: Secondary | ICD-10-CM

## 2021-05-20 DIAGNOSIS — I10 Essential (primary) hypertension: Secondary | ICD-10-CM

## 2021-05-20 DIAGNOSIS — I639 Cerebral infarction, unspecified: Secondary | ICD-10-CM | POA: Diagnosis not present

## 2021-05-20 DIAGNOSIS — H543 Unqualified visual loss, both eyes: Secondary | ICD-10-CM

## 2021-05-20 DIAGNOSIS — E785 Hyperlipidemia, unspecified: Secondary | ICD-10-CM

## 2021-05-20 DIAGNOSIS — G441 Vascular headache, not elsewhere classified: Secondary | ICD-10-CM

## 2021-05-20 DIAGNOSIS — R2990 Unspecified symptoms and signs involving the nervous system: Secondary | ICD-10-CM | POA: Diagnosis not present

## 2021-05-20 MED ORDER — TOPIRAMATE 25 MG PO TABS
25.0000 mg | ORAL_TABLET | Freq: Two times a day (BID) | ORAL | 3 refills | Status: DC
  Filled 2021-05-20: qty 60, 30d supply, fill #0
  Filled 2021-06-13: qty 60, 30d supply, fill #1
  Filled 2021-07-15: qty 60, 30d supply, fill #0
  Filled 2021-08-15: qty 60, 30d supply, fill #1
  Filled 2021-08-16: qty 60, 30d supply, fill #0
  Filled 2021-09-19: qty 180, 90d supply, fill #1

## 2021-05-20 NOTE — Progress Notes (Signed)
[name] [address] [place]. [place] [address] [phone]       STROKE FOLLOW UP NOTE  [name] Date of Birth:  Jun 22, 1965 Medical Record Number:  [id number]   Reason for Referral: stroke follow up    SUBJECTIVE:   CHIEF COMPLAINT:  Chief Complaint  Patient presents with   Follow-up    Rm 3 here for 3 month f/u reports she has been doing well      HPI:   Update 05/20/2021 JM: Returns for stroke follow-up after prior visit with Dr. Leonie Man approx 5 mo ago for strokelike episode s/p tPA on 11/28/2020 and prior stroke 09/15/2020.  She has been stable from stroke standpoint without new stroke/TIA symptoms.  At prior visit, was switched from gabapentin to topiramate for poststroke paresthesias with significant improvement of tingling. Occasional "twinge" but doesn't last long. She had not had any additional severe headaches. Occasional mild headaches. EEG completed on 01/25/2021 to rule out seizures with remote history which was unremarkable. Reports continued poor vision chronically but worsened post stroke -previously discussed evaluation by ophthalmology which she now wishes to pursue as she is insured.  She is working at [employer] in [place].  Remains on Plavix and atorvastatin -denies side effects.  Blood pressure today 122/84 on losartan 25 mg daily.  No further concerns at this time    History provided for reference purposes only Update 12/14/2020 Dr. Leonie Man: She is seen today for follow-up after recent hospital admission from 11/28/2020 for strokelike symptoms.  She presented with sudden onset of right-sided weakness and numbness.  CT head was unremarkable CT angiogram of the head and neck did not show significant large vessel stenosis.  She received IV tPA and did well.  An MRI scan of the brain was obtained which was negative for acute stroke.  LDL cholesterol was 39 mg percent hemoglobin A1c was 5.7.  Echocardiogram so not repeated as  she had one in April 2022 TEE which was normal.  She had an EEG done this admission which was negative for seizure activity.  Patient physical exam was consistent with functional components including lack of activation of the right cheek when asked to smile but she was able to puff her cheeks symmetrically or blow air with these and activated facial muscles when talking.  She was felt to have strokelike episode secondary to conversion reaction versus atypical migraine.  Patient states she is done well since discharge.  She has increase her gabapentin to 300 mg in the morning, afternoon and 600 at night but see still not happy and is having right-sided facial paresthesias.  Headaches seem under better control.  She is on aspirin and Plavix tolerating them well with minor bruising and no bleeding.  Blood pressures well controlled.  She is tolerating Lipitor well without muscle aches and pains.   Initial visit 10/22/2020 JM: [name] is being seen for hospital follow-up accompanied by her friend, Richard.  Reports residual blurred vision with reading and writing. Reports some visual impairment prior to her stroke having to use reading glasses but has had greater difficulty with reading even with using her reading glasses since her stroke.  She has returned back to almost all prior activities.  She has not worked since the onset of [place].  She is currently uninsured and in the process of applying for [employer] assistance program.  She also reports almost daily right sided headaches which have been present since her stroke.  She does  have history of migraines but has not experienced any migraines since 01/2020 - these headaches are not consistent with her known migraines.  Reports continued constant bilateral hand and feet numbness/tingling.  Has been experiencing the symptoms for years but typically come and go but since her stroke they have been constant.  She will occasionally experience painful  sensation.  She was prescribed gabapentin 100 mg 3 times daily at hospital discharge but she has been tolerating but does not believe it has been beneficial  She has remained on DAPT despite 3-week recommendation but denies bleeding or bruising Remains on atorvastatin 80 mg daily without myalgias Blood pressure today 125/83  No further concerns at this time  Stroke admission 09/15/2020 55 year old female with a history of hypertension, seizure like episodes in her 41s, heart murmur, migraine headaches,, tobacco use, obesity, chronic parasthesias bilat hands, depression, RLS, ADD who presented on 09/15/2020 with facial droop and speech difficulties that started 3/28.  Personally reviewed hospitalization pertinent progress notes, lab work and imaging with summary provided.  Evaluated by Dr. Leonie Man with stroke work-up revealing acute subcortical infarct in the region of the caudate tail on the left due to large cortical infarct likely of cryptogenic etiology.  Recommended DAPT for 3 weeks and aspirin alone.  TEE negative for PFO or cardiac source of embolism identified.  Hypercoagulable labs negative. Loop recorder not placed prior to d/c. Hormonal therapy for postmenopausal hot flashes discontinued.  LDL 133 -recommend initiating atorvastatin 80 mg daily.  Complaints of bilateral paresthesias/headaches/rashes unclear etiology with brain and C-spine imaging no acute findings to explain her symptoms.  Vasculitis labs negative.  No prior stroke history.   Acute subcortical infarct in the region of the caudate tail on the left due to large subcortical infarct likely of cryptogenic etiology   CT Code Stroke: A small age-indeterminate lacunar infarct is questioned within the left pons. There is no acute intracranial hemorrhage. MRI brain Diffusion abnormality in the caudate tail on the left most consistent with stroke. MRA head: Truncated examination. Small acute infarct of the left caudate tail. Normal  intracranial MRA. Carotid Doppler Nearly normal bilaterally, no high grade stenosis 2D Echo EF 60-65%, No thrombus, no wall motion abnormality or shunt found.  Bilat LE Doppler no evidence of DVT Hypercoagulable labs negative TEE EF 60 to 65%.  Negative for PFO.  No evidence of cardiac source of embolism identified LDL 133 HgbA1c 5.7 No antiplatelet or anticoagulant medications prior to admission Recommend DAPT with ASA 81mg  and Plavix 75mg  x 3 weeks then ASA 81mg  monotherapy Therapy recommendations:  none Disposition:   Home on 09/18/2020       ROS:   14 system review of systems performed and negative with exception of those listed in HPI  PMH:  Past Medical History:  Diagnosis Date   ADD (attention deficit disorder)    Allergy    Anemia    Arthritis    OA back    Cyst of right ovary    Depression    Heart murmur    History of kidney stones    Hypertension    Neuromuscular disorder ([place])    RLS   Restless leg syndrome    Seizures ([place])    in her 20's- none since-? etiology     PSH:  Past Surgical History:  Procedure Laterality Date   APPENDECTOMY  2008   BUBBLE STUDY  09/18/2020   Procedure: BUBBLE STUDY;  Surgeon: Fay Records, MD;  Location: [facility] ENDOSCOPY;  Service: Cardiovascular;;   BUNIONECTOMY  2009   x 2   TEE WITHOUT CARDIOVERSION N/A 09/18/2020   Procedure: TRANSESOPHAGEAL ECHOCARDIOGRAM (TEE);  Surgeon: Fay Records, MD;  Location: [facility] ENDOSCOPY;  Service: Cardiovascular;  Laterality: N/A;   WISDOM TOOTH EXTRACTION      Social History:  Social History   Socioeconomic History   Marital status: Single    Spouse name: Not on file   Number of children: Not on file   Years of education: Not on file   Highest education level: Not on file  Occupational History   Not on file  Tobacco Use   Smoking status: Former   Smokeless tobacco: Never   Tobacco comments:    on and off in the past, quit in 2002  Vaping Use   Vaping Use: Never used  Substance and  Sexual Activity   Alcohol use: Yes    Comment: occ   Drug use: Never   Sexual activity: Not on file  Other Topics Concern   Not on file  Social History Narrative   Work or School: Biochemist, clinical - [employer]      Home Situation: none      Spiritual Beliefs: Christian      Lifestyle: no regular exercise; so so            Social Determinants of Radio broadcast assistant Strain: Not on file  Food Insecurity: Not on file  Transportation Needs: Not on file  Physical Activity: Not on file  Stress: Not on file  Social Connections: Not on file  Intimate Partner Violence: Not on file    Family History:  Family History  Problem Relation Age of Onset   Colon cancer Father 59   Colon cancer Mother 30   Heart attack Mother    Dementia Mother    Colon polyps Neg Hx    Esophageal cancer Neg Hx    Stomach cancer Neg Hx    Rectal cancer Neg Hx     Medications:   Current Outpatient Medications on File Prior to Visit  Medication Sig Dispense Refill   acetaminophen (TYLENOL) 325 MG tablet Take 650 mg by mouth every 6 (six) hours as needed for moderate pain or headache.     atorvastatin (LIPITOR) 80 MG tablet Take 1 tablet (80 mg total) by mouth daily at 6 PM. 90 tablet 1   clopidogrel (PLAVIX) 75 MG tablet Take 1 tablet (75 mg total) by mouth daily. 60 tablet 3   escitalopram (LEXAPRO) 10 MG tablet TAKE 1 TABLET (10 MG TOTAL) BY MOUTH DAILY. 90 tablet 1   losartan (COZAAR) 25 MG tablet Take 1 tablet (25 mg total) by mouth daily. 90 tablet 1   Multiple Vitamin (MULTIVITAMIN) tablet Take 1 tablet by mouth daily.     topiramate (TOPAMAX) 25 MG tablet Take 1 tablet (25 mg total) by mouth 2 (two) times daily. 120 tablet 3   valACYclovir (VALTREX) 500 MG tablet Take 0.5 tablets (250 mg total) by mouth daily. 90 tablet 3   No current facility-administered medications on file prior to visit.    Allergies:   Allergies  Allergen Reactions   Ciprofloxacin Swelling   Levaquin [Levofloxacin]  Anaphylaxis    Hives and throat swelling    Amoxil [Amoxicillin] Rash    Extreme itching limbs and trunk    Dynacin [Minocycline] Rash   Sulfamethazine Rash      OBJECTIVE:  Physical Exam  Vitals:   05/20/21 0750  BP:  122/84  Pulse: 66  SpO2: 96%  Weight: 204 lb (92.5 kg)  Height: 5' 6.5" (1.689 m)    Body mass index is 32.43 kg/m. No results found.  General: well developed, well nourished, very pleasant middle-age Caucasian female, seated, in no evident distress Head: head normocephalic and atraumatic.   Neck: supple with no carotid or supraclavicular bruits Cardiovascular: regular rate and rhythm, no murmurs Musculoskeletal: No abnormality Skin:  no rash/petichiae Vascular:  Normal pulses all extremities   Neurologic Exam Mental Status: Awake and fully alert.   Fluent speech and language.  Oriented to place and time. Recent and remote memory intact. Attention span, concentration and fund of knowledge appropriate. Mood and affect appropriate.  Cranial Nerves: Pupils equal, briskly reactive to light. Extraocular movements full without nystagmus. Visual fields full to confrontation. Hearing intact. Facial sensation intact. Face, tongue, palate moves normally and symmetrically.  Motor: Normal bulk and tone. Normal strength in all tested extremity muscles Sensory.: intact to touch , pinprick , position and vibratory sensation.  Coordination: Rapid alternating movements normal in all extremities. Finger-to-nose and heel-to-shin performed accurately bilaterally. Gait and Station: Arises from chair without difficulty. Stance is normal. Gait demonstrates normal stride length and balance without use of AD. Tandem walk and heel toe without difficulty Reflexes: 1+ and symmetric. Toes downgoing.        ASSESSMENT: Sarah Simpson is a 55 y.o. 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 Vascular risk factors include HTN, HLD, remote seizure history, migraine headaches, former tobacco use and obesity.      PLAN:  Cryptogenic stroke: Strokelike episode: Residual deficit: post stroke dysesthesias and headache  Continue topiramate 25 mg twice daily -refill provided Referral placed to cardiac electrophysiology to further discuss placement of loop recorder for prior cryptogenic stroke (previously on hold Continue clopidogrel 75 mg daily  and atorvastatin 80 mg daily for secondary stroke prevention - advised refills for these medications will need to be obtained by PCP Discussed secondary stroke prevention measures and importance of close PCP follow up for aggressive stroke risk factor management  HTN: BP goal <130/90.  Stable on losartan per PCP HLD: LDL goal <70. Recent LDL 46 on atorvastatin 80 mg daily Bilateral hand and feet paresthesias: Chronic issue of unknown etiology reporting worsening for stroke.  Greatly improved after use of topamax - continue topirmate 25mg  twice daily. Pt wishes to hold off on further evaluation at this time Decreased vision OU: chronic issue reporting worsening poststroke.  Referral placed to ophthalmology Remote seizure history: EEG 01/25/2021 unremarkable. Not currently on AEDs.  No indication at this time.  Continue to monitor.    Follow up in 6 months or call earlier if needed    CC:  PCP: Kerin Perna, NP    I spent 34 minutes of face-to-face and non-face-to-face time with patient.  This included previsit chart review, lab review, study review, order entry, electronic health record documentation, patient education and discussion regarding history of prior stroke and possible etiologies and indication for further evaluation for cardiac monitoring, residual deficits and ongoing use of topiramate, secondary stroke prevention measures and aggressive stroke risk factor management, as well as discussed all above topics as noted  and answered all other questions to patient satisfaction   [name], [facility]  [facility] [address] [place], [place] [address]  Phone [phone] Fax [phone] Note: This document was prepared  with digital dictation and possible smart phrase technology. Any transcriptional errors that result from this process are unintentional.

## 2021-05-20 NOTE — Patient Instructions (Addendum)
Continue clopidogrel 75 mg daily  and atorvastatin 80mg  daily  for secondary stroke prevention  Continue to follow up with PCP regarding cholesterol and blood pressure management Maintain strict control of hypertension with blood pressure goal below 130/90 and cholesterol with LDL cholesterol (bad cholesterol) goal below 70 mg/dL.   Continue topamax 25mg  twice daily for headaches and nerve pain  You will be called by cardiology to discuss loop recorder placement   You will be called by [facility] to further visual examination      Followup in the future with me in 6 months or call earlier if needed       Thank you for coming to see [place] at [facility]. I hope we have been able to provide you high quality care today.  You may receive a patient satisfaction survey over the next few weeks. We would appreciate your feedback and comments so that we may continue to improve ourselves and the health of our patients.

## 2021-06-02 ENCOUNTER — Encounter: Payer: Self-pay | Admitting: Cardiology

## 2021-06-15 ENCOUNTER — Other Ambulatory Visit: Payer: Self-pay

## 2021-07-01 ENCOUNTER — Ambulatory Visit (INDEPENDENT_AMBULATORY_CARE_PROVIDER_SITE_OTHER): Payer: Managed Care, Other (non HMO) | Admitting: Primary Care

## 2021-07-01 ENCOUNTER — Other Ambulatory Visit: Payer: Self-pay

## 2021-07-01 ENCOUNTER — Encounter (INDEPENDENT_AMBULATORY_CARE_PROVIDER_SITE_OTHER): Payer: Self-pay | Admitting: Primary Care

## 2021-07-01 VITALS — BP 124/76 | HR 59 | Temp 97.7°F | Ht 67.0 in | Wt 208.8 lb

## 2021-07-01 DIAGNOSIS — Z23 Encounter for immunization: Secondary | ICD-10-CM | POA: Diagnosis not present

## 2021-07-01 DIAGNOSIS — R7303 Prediabetes: Secondary | ICD-10-CM

## 2021-07-01 DIAGNOSIS — F331 Major depressive disorder, recurrent, moderate: Secondary | ICD-10-CM | POA: Diagnosis not present

## 2021-07-01 DIAGNOSIS — I639 Cerebral infarction, unspecified: Secondary | ICD-10-CM

## 2021-07-01 DIAGNOSIS — I10 Essential (primary) hypertension: Secondary | ICD-10-CM | POA: Diagnosis not present

## 2021-07-01 DIAGNOSIS — Z760 Encounter for issue of repeat prescription: Secondary | ICD-10-CM

## 2021-07-01 LAB — POCT GLYCOSYLATED HEMOGLOBIN (HGB A1C): Hemoglobin A1C: 5.9 % — AB (ref 4.0–5.6)

## 2021-07-01 MED ORDER — LOSARTAN POTASSIUM 25 MG PO TABS
25.0000 mg | ORAL_TABLET | Freq: Every day | ORAL | 1 refills | Status: DC
  Filled 2021-07-01 – 2021-07-04 (×2): qty 90, 90d supply, fill #0
  Filled 2021-07-11: qty 34, 34d supply, fill #0
  Filled 2021-08-15: qty 34, 34d supply, fill #1
  Filled 2021-08-16: qty 34, 34d supply, fill #0
  Filled 2021-09-19: qty 34, 34d supply, fill #1
  Filled 2021-10-24: qty 34, 34d supply, fill #2
  Filled 2021-11-21: qty 34, 34d supply, fill #3
  Filled 2022-01-06: qty 34, 34d supply, fill #4

## 2021-07-01 MED ORDER — ESCITALOPRAM OXALATE 10 MG PO TABS
10.0000 mg | ORAL_TABLET | Freq: Every day | ORAL | 1 refills | Status: DC
  Filled 2021-07-01: qty 30, 30d supply, fill #0
  Filled 2021-09-19: qty 30, 30d supply, fill #1
  Filled 2021-09-20: qty 30, 30d supply, fill #0
  Filled 2021-10-24: qty 90, 90d supply, fill #1
  Filled 2022-01-16: qty 90, 90d supply, fill #2

## 2021-07-01 MED ORDER — ATORVASTATIN CALCIUM 80 MG PO TABS
80.0000 mg | ORAL_TABLET | Freq: Every day | ORAL | 1 refills | Status: DC
  Filled 2021-07-01: qty 90, 90d supply, fill #0
  Filled 2021-07-04: qty 30, 30d supply, fill #0
  Filled 2021-07-11: qty 34, 34d supply, fill #0
  Filled 2021-08-15: qty 34, 34d supply, fill #1
  Filled 2021-08-16: qty 34, 34d supply, fill #0
  Filled 2021-09-19: qty 34, 34d supply, fill #1
  Filled 2021-10-24: qty 34, 34d supply, fill #2
  Filled 2021-11-21: qty 34, 34d supply, fill #3
  Filled 2022-01-06: qty 34, 34d supply, fill #4

## 2021-07-01 NOTE — Patient Instructions (Signed)

## 2021-07-01 NOTE — Progress Notes (Signed)
[facility]  Subjective:     [name] is a 56 y.o. female here for discussion regarding weight loss. She has noted a weight gain of approximately 4 pounds over the last 1 month. She feels ideal weight is 150 pounds. History of eating disorders: none. There is a family history positive for obesity in the patient. Previous treatments for obesity include self-directed dieting. Obesity associated medical conditions: coronary artery disease and hyperlipidemia. Obesity associated medications: none. Cardiovascular risk factors besides obesity: advanced age (older than 59 for men, 72 for women), dyslipidemia, hypertension, and obesity (BMI >= 30 kg/m2).  The following portions of the patient's history were reviewed and updated as appropriate: allergies, current medications, past family history, past medical history, past social history, and past surgical history.  Review of Systems Pertinent items noted in HPI and remainder of comprehensive ROS otherwise negative.    Objective:    Body mass index is 32.7 kg/m. BP 124/76 (BP Location: Right Arm, Patient Position: Sitting, Cuff Size: Normal)    Pulse (!) 59    Temp 97.7 F (36.5 C) (Temporal)    Ht _0  (1.702 m)    Wt 208 lb 12.8 oz (94.7 kg)    SpO2 95%    BMI 32.70 kg/m  General appearance: alert, cooperative, appears stated age, and mildly obese Head: Normocephalic, without obvious abnormality, atraumatic Eyes: conjunctivae/corneas clear. PERRL, EOM's intact. Fundi benign. Lungs: clear to auscultation bilaterally Heart: regular rate and rhythm, S1, S2 normal, no murmur, click, rub or gallop Abdomen: soft, non-tender; bowel sounds normal; no masses,  no organomegaly Extremities: extremities normal, atraumatic, no cyanosis or edema Skin: Skin color, texture, turgor normal. No rashes or lesions Neurologic: Alert and oriented X 3, normal strength and tone. Normal symmetric reflexes. Normal coordination and gait     Assessment:    Sarah Simpson was seen today for weight management.  Diagnoses and all orders for this visit:  Need for prophylactic vaccination against Streptococcus pneumoniae (pneumococcus) -     Pneumococcal conjugate vaccine 20-valent (Prevnar 20)  Prediabetes -     HgB A1c 5.9 previously A1C 6.3 on no medication  Continue to monitor that are high in carbohydrates are the following rice, potatoes, breads, sugars, and pastas.  Reduction in the intake (eating) will assist in lowering your blood sugars.   Benign essential hypertension Well controlled  goal of less than 130/80,met  low-sodium, DASH diet, medication compliance, 150 minutes of moderate intensity exercise per week. Discussed medication compliance, adverse effects.  -     losartan (COZAAR) 25 MG tablet; Take 1 tablet (25 mg total) by mouth daily.  Medication refill -     losartan (COZAAR) 25 MG tablet; Take 1 tablet (25 mg total) by mouth daily. -     escitalopram (LEXAPRO) 10 MG tablet; TAKE 1 TABLET (10 MG TOTAL) BY MOUTH DAILY. -     atorvastatin (LIPITOR) 80 MG tablet; Take 1 tablet (80 mg total) by mouth daily at 6 PM.  Moderate recurrent major depression (HCC) Stable on current dose no s/s of harm to self or others, sleeping better  -     escitalopram (LEXAPRO) 10 MG tablet; TAKE 1 TABLET (10 MG TOTAL) BY MOUTH DAILY.  Acute CVA (cerebrovascular accident) ([place]) -     atorvastatin (LIPITOR) 80 MG tablet; Take 1 tablet (80 mg total) by mouth daily at 6 PM.   Kerin Perna

## 2021-07-05 ENCOUNTER — Other Ambulatory Visit: Payer: Self-pay

## 2021-07-12 ENCOUNTER — Other Ambulatory Visit: Payer: Self-pay

## 2021-07-15 ENCOUNTER — Other Ambulatory Visit: Payer: Self-pay

## 2021-08-15 ENCOUNTER — Other Ambulatory Visit (INDEPENDENT_AMBULATORY_CARE_PROVIDER_SITE_OTHER): Payer: Self-pay

## 2021-08-16 ENCOUNTER — Other Ambulatory Visit: Payer: Self-pay

## 2021-08-16 ENCOUNTER — Other Ambulatory Visit (HOSPITAL_COMMUNITY): Payer: Self-pay

## 2021-08-19 ENCOUNTER — Telehealth (INDEPENDENT_AMBULATORY_CARE_PROVIDER_SITE_OTHER): Payer: Self-pay | Admitting: Primary Care

## 2021-08-19 ENCOUNTER — Other Ambulatory Visit: Payer: Self-pay

## 2021-08-19 NOTE — Telephone Encounter (Signed)
Sent to PCP ?

## 2021-08-23 ENCOUNTER — Other Ambulatory Visit (INDEPENDENT_AMBULATORY_CARE_PROVIDER_SITE_OTHER): Payer: Self-pay

## 2021-08-26 ENCOUNTER — Other Ambulatory Visit: Payer: Self-pay | Admitting: Adult Health

## 2021-08-26 ENCOUNTER — Other Ambulatory Visit (INDEPENDENT_AMBULATORY_CARE_PROVIDER_SITE_OTHER): Payer: Self-pay | Admitting: Primary Care

## 2021-08-26 ENCOUNTER — Other Ambulatory Visit: Payer: Self-pay

## 2021-08-26 ENCOUNTER — Other Ambulatory Visit (INDEPENDENT_AMBULATORY_CARE_PROVIDER_SITE_OTHER): Payer: Self-pay

## 2021-08-26 NOTE — Telephone Encounter (Addendum)
Pt is calling to follow up on medication refill.  ?Pt stated she has had two strokes in the past year and she needs her medication. Pt stated has been out of medication for over a week.  ? ?Pt requesting a call back.  ?

## 2021-08-26 NOTE — Telephone Encounter (Signed)
Pt called in asking why her Plavix was denied and not refilled. I advised her that she would need to follow up with neurology dr since they are the one's who prescribed medication. Pt was told this earlier as well.  ?

## 2021-08-26 NOTE — Telephone Encounter (Signed)
Sent to PCP ?

## 2021-08-26 NOTE — Telephone Encounter (Signed)
Patient called and advised Plavix not filled due to neurology note, advised to call the neurologist office to request the refill. Patient verbalized understanding. ?

## 2021-08-30 ENCOUNTER — Other Ambulatory Visit: Payer: Self-pay

## 2021-08-30 MED ORDER — CLOPIDOGREL BISULFATE 75 MG PO TABS
75.0000 mg | ORAL_TABLET | Freq: Every day | ORAL | 3 refills | Status: DC
  Filled 2021-08-30: qty 30, 30d supply, fill #0

## 2021-09-06 ENCOUNTER — Other Ambulatory Visit: Payer: Self-pay

## 2021-09-19 ENCOUNTER — Other Ambulatory Visit (HOSPITAL_COMMUNITY): Payer: Self-pay

## 2021-09-20 ENCOUNTER — Other Ambulatory Visit (HOSPITAL_COMMUNITY): Payer: Self-pay

## 2021-10-13 ENCOUNTER — Other Ambulatory Visit (HOSPITAL_BASED_OUTPATIENT_CLINIC_OR_DEPARTMENT_OTHER): Payer: Self-pay

## 2021-10-18 ENCOUNTER — Other Ambulatory Visit (HOSPITAL_COMMUNITY): Payer: Self-pay

## 2021-10-25 ENCOUNTER — Other Ambulatory Visit (HOSPITAL_COMMUNITY): Payer: Self-pay

## 2021-11-08 ENCOUNTER — Other Ambulatory Visit (HOSPITAL_COMMUNITY): Payer: Self-pay

## 2021-11-17 NOTE — Progress Notes (Unsigned)
[name] [address] [place]. [place] [address] [phone]       STROKE FOLLOW UP NOTE  [name] Date of Birth:  [birth year] Medical Record Number:  [id number]   Reason for Referral: stroke follow up    SUBJECTIVE:   CHIEF COMPLAINT:  No chief complaint on file.    HPI:   Update 11/17/2021 JM: Patient returns for stroke follow-up after prior visit 6 months ago.  She has been stable from stroke standpoint without new stroke/TIA symptoms.  Reports residual ***.   Compliant on Plavix and atorvastatin, denies side effects.  Blood pressure today ***.  No new concerns at this time.        History provided for reference purposes only  Update 05/20/2021 JM: Returns for stroke follow-up after prior visit with Dr. Leonie Man approx 5 mo ago for strokelike episode s/p tPA on 11/28/2020 and prior stroke 09/15/2020.  She has been stable from stroke standpoint without new stroke/TIA symptoms.  At prior visit, was switched from gabapentin to topiramate for poststroke paresthesias with significant improvement of tingling. Occasional "twinge" but doesn't last long. She had not had any additional severe headaches. Occasional mild headaches. EEG completed on 01/25/2021 to rule out seizures with remote history which was unremarkable. Reports continued poor vision chronically but worsened post stroke -previously discussed evaluation by ophthalmology which she now wishes to pursue as she is insured.  She is working at [employer] in [place].  Remains on Plavix and atorvastatin -denies side effects.  Blood pressure today 122/84 on losartan 25 mg daily.  No further concerns at this time  Update 12/14/2020 Dr. Leonie Man: She is seen today for follow-up after recent hospital admission from 11/28/2020 for strokelike symptoms.  She presented with sudden onset of right-sided weakness and numbness.  CT head was unremarkable CT angiogram of the head and neck did not show  significant large vessel stenosis.  She received IV tPA and did well.  An MRI scan of the brain was obtained which was negative for acute stroke.  LDL cholesterol was 39 mg percent hemoglobin A1c was 5.7.  Echocardiogram so not repeated as she had one in April 2022 TEE which was normal.  She had an EEG done this admission which was negative for seizure activity.  Patient physical exam was consistent with functional components including lack of activation of the right cheek when asked to smile but she was able to puff her cheeks symmetrically or blow air with these and activated facial muscles when talking.  She was felt to have strokelike episode secondary to conversion reaction versus atypical migraine.  Patient states she is done well since discharge.  She has increase her gabapentin to 300 mg in the morning, afternoon and 600 at night but see still not happy and is having right-sided facial paresthesias.  Headaches seem under better control.  She is on aspirin and Plavix tolerating them well with minor bruising and no bleeding.  Blood pressures well controlled.  She is tolerating Lipitor well without muscle aches and pains.   Initial visit 10/22/2020 JM: [name] is being seen for hospital follow-up accompanied by her friend, Richard.  Reports residual blurred vision with reading and writing. Reports some visual impairment prior to her stroke having to use reading glasses but has had greater difficulty with reading even with using her reading glasses since her stroke.  She has returned back to almost all prior activities.  She has not worked since the onset of  COVID.  She is currently uninsured and in the process of applying for [employer] assistance program.  She also reports almost daily right sided headaches which have been present since her stroke.  She does have history of migraines but has not experienced any migraines since 01/2020 - these headaches are not consistent with her known  migraines.  Reports continued constant bilateral hand and feet numbness/tingling.  Has been experiencing the symptoms for years but typically come and go but since her stroke they have been constant.  She will occasionally experience painful sensation.  She was prescribed gabapentin 100 mg 3 times daily at hospital discharge but she has been tolerating but does not believe it has been beneficial  She has remained on DAPT despite 3-week recommendation but denies bleeding or bruising Remains on atorvastatin 80 mg daily without myalgias Blood pressure today 125/83  No further concerns at this time  Stroke admission 09/15/2020 56 year old female with a history of hypertension, seizure like episodes in her 33s, heart murmur, migraine headaches,, tobacco use, obesity, chronic parasthesias bilat hands, depression, RLS, ADD who presented on 09/15/2020 with facial droop and speech difficulties that started 3/28.  Personally reviewed hospitalization pertinent progress notes, lab work and imaging with summary provided.  Evaluated by Dr. Leonie Man with stroke work-up revealing acute subcortical infarct in the region of the caudate tail on the left due to large cortical infarct likely of cryptogenic etiology.  Recommended DAPT for 3 weeks and aspirin alone.  TEE negative for PFO or cardiac source of embolism identified.  Hypercoagulable labs negative. Loop recorder not placed prior to d/c. Hormonal therapy for postmenopausal hot flashes discontinued.  LDL 133 -recommend initiating atorvastatin 80 mg daily.  Complaints of bilateral paresthesias/headaches/rashes unclear etiology with brain and C-spine imaging no acute findings to explain her symptoms.  Vasculitis labs negative.  No prior stroke history.   Acute subcortical infarct in the region of the caudate tail on the left due to large subcortical infarct likely of cryptogenic etiology   CT Code Stroke: A small age-indeterminate lacunar infarct is questioned within  the left pons. There is no acute intracranial hemorrhage. MRI brain Diffusion abnormality in the caudate tail on the left most consistent with stroke. MRA head: Truncated examination. Small acute infarct of the left caudate tail. Normal intracranial MRA. Carotid Doppler Nearly normal bilaterally, no high grade stenosis 2D Echo EF 60-65%, No thrombus, no wall motion abnormality or shunt found.  Bilat LE Doppler no evidence of DVT Hypercoagulable labs negative TEE EF 60 to 65%.  Negative for PFO.  No evidence of cardiac source of embolism identified LDL 133 HgbA1c 5.7 No antiplatelet or anticoagulant medications prior to admission Recommend DAPT with ASA '81mg'$  and Plavix '75mg'$  x 3 weeks then ASA '81mg'$  monotherapy Therapy recommendations:  none Disposition:   Home on 09/18/2020       ROS:   14 system review of systems performed and negative with exception of those listed in HPI  PMH:  Past Medical History:  Diagnosis Date   ADD (attention deficit disorder)    Allergy    Anemia    Arthritis    OA back    Cyst of right ovary    Depression    Heart murmur    History of kidney stones    Hypertension    Neuromuscular disorder (HCC)    RLS   Restless leg syndrome    Seizures ([place])    in her 20's- none since-? etiology  PSH:  Past Surgical History:  Procedure Laterality Date   APPENDECTOMY  2008   BUBBLE STUDY  09/18/2020   Procedure: BUBBLE STUDY;  Surgeon: Fay Records, MD;  Location: [facility] ENDOSCOPY;  Service: Cardiovascular;;   BUNIONECTOMY  2009   x 2   TEE WITHOUT CARDIOVERSION N/A 09/18/2020   Procedure: TRANSESOPHAGEAL ECHOCARDIOGRAM (TEE);  Surgeon: Fay Records, MD;  Location: [facility] ENDOSCOPY;  Service: Cardiovascular;  Laterality: N/A;   WISDOM TOOTH EXTRACTION      Social History:  Social History   Socioeconomic History   Marital status: Single    Spouse name: Not on file   Number of children: Not on file   Years of education: Not on file   Highest education  level: Not on file  Occupational History   Not on file  Tobacco Use   Smoking status: Former   Smokeless tobacco: Never   Tobacco comments:    on and off in the past, quit in 2002  Vaping Use   Vaping Use: Never used  Substance and Sexual Activity   Alcohol use: Yes    Comment: occ   Drug use: Never   Sexual activity: Not on file  Other Topics Concern   Not on file  Social History Narrative   Work or School: Biochemist, clinical - [employer]      Home Situation: none      Spiritual Beliefs: Christian      Lifestyle: no regular exercise; so so            Social Determinants of Radio broadcast assistant Strain: Not on file  Food Insecurity: Not on file  Transportation Needs: Not on file  Physical Activity: Not on file  Stress: Not on file  Social Connections: Not on file  Intimate Partner Violence: Not on file    Family History:  Family History  Problem Relation Age of Onset   Colon cancer Father 71   Colon cancer Mother 26   Heart attack Mother    Dementia Mother    Colon polyps Neg Hx    Esophageal cancer Neg Hx    Stomach cancer Neg Hx    Rectal cancer Neg Hx     Medications:   Current Outpatient Medications on File Prior to Visit  Medication Sig Dispense Refill   acetaminophen (TYLENOL) 325 MG tablet Take 650 mg by mouth every 6 (six) hours as needed for moderate pain or headache.     atorvastatin (LIPITOR) 80 MG tablet Take 1 tablet (80 mg total) by mouth daily at 6 PM. 90 tablet 1   clopidogrel (PLAVIX) 75 MG tablet Take 1 tablet (75 mg total) by mouth daily. 60 tablet 3   escitalopram (LEXAPRO) 10 MG tablet TAKE 1 TABLET (10 MG TOTAL) BY MOUTH DAILY. 90 tablet 1   losartan (COZAAR) 25 MG tablet Take 1 tablet (25 mg total) by mouth daily. 90 tablet 1   Multiple Vitamin (MULTIVITAMIN) tablet Take 1 tablet by mouth daily.     topiramate (TOPAMAX) 25 MG tablet Take 1 tablet (25 mg total) by mouth 2 (two) times daily. 180 tablet 3   valACYclovir (VALTREX) 500 MG  tablet Take 1/2 tablet (250 mg total) by mouth daily. 90 tablet 3   No current facility-administered medications on file prior to visit.    Allergies:   Allergies  Allergen Reactions   Ciprofloxacin Swelling   Levaquin [Levofloxacin] Anaphylaxis    Hives and throat swelling    Amoxil [Amoxicillin]  Rash    Extreme itching limbs and trunk    Dynacin [Minocycline] Rash   Sulfamethazine Rash      OBJECTIVE:  Physical Exam  There were no vitals filed for this visit.   There is no height or weight on file to calculate BMI. No results found.  General: well developed, well nourished, very pleasant middle-age Caucasian female, seated, in no evident distress Head: head normocephalic and atraumatic.   Neck: supple with no carotid or supraclavicular bruits Cardiovascular: regular rate and rhythm, no murmurs Musculoskeletal: No abnormality Skin:  no rash/petichiae Vascular:  Normal pulses all extremities   Neurologic Exam Mental Status: Awake and fully alert.   Fluent speech and language.  Oriented to place and time. Recent and remote memory intact. Attention span, concentration and fund of knowledge appropriate. Mood and affect appropriate.  Cranial Nerves: Pupils equal, briskly reactive to light. Extraocular movements full without nystagmus. Visual fields full to confrontation. Hearing intact. Facial sensation intact. Face, tongue, palate moves normally and symmetrically.  Motor: Normal bulk and tone. Normal strength in all tested extremity muscles Sensory.: intact to touch , pinprick , position and vibratory sensation.  Coordination: Rapid alternating movements normal in all extremities. Finger-to-nose and heel-to-shin performed accurately bilaterally. Gait and Station: Arises from chair without difficulty. Stance is normal. Gait demonstrates normal stride length and balance without use of AD. Tandem walk and heel toe without difficulty Reflexes: 1+ and symmetric. Toes downgoing.         ASSESSMENT: Sarah Simpson is a 56 y.o. 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 Vascular risk factors include HTN, HLD, remote seizure history, migraine headaches, former tobacco use and obesity.      PLAN:  Cryptogenic stroke: Strokelike episode: Residual deficit: post stroke dysesthesias and headache  Continue topiramate 25 mg twice daily -refill provided Referral placed to cardiac electrophysiology to further discuss placement of loop recorder for prior cryptogenic stroke (previously on hold Continue clopidogrel 75 mg daily  and atorvastatin 80 mg daily for secondary stroke prevention - advised refills for these medications will need to be obtained by PCP Discussed secondary stroke prevention measures and importance of close PCP follow up for aggressive stroke risk factor management including BP goal<130/90 and HLD with LDL goal<70  Bilateral hand and feet paresthesias: Chronic issue of unknown etiology reporting worsening for stroke.  Greatly improved after use of topamax - continue topirmate '25mg'$  twice daily. Pt wishes to hold off on further evaluation at this time Decreased vision OU: chronic issue reporting worsening poststroke.  Referral placed to ophthalmology Remote seizure history: EEG 01/25/2021 unremarkable. Not currently on AEDs.  No indication at this time.  Continue to monitor.    Follow up in 6 months or call earlier if needed    CC:  PCP: Kerin Perna, NP    I spent 34 minutes of face-to-face and non-face-to-face time with patient.  This included previsit chart review, lab review, study review, order entry, electronic health record documentation, patient education and discussion regarding history of prior stroke and possible etiologies and indication for further evaluation for cardiac monitoring, residual deficits and ongoing use of  topiramate, secondary stroke prevention measures and aggressive stroke risk factor management, as well as discussed all above topics as noted and answered all other questions to patient satisfaction   [name], [facility]  [facility] [address] [place], [place] [address]  Phone  [phone] Fax [phone] Note: This document was prepared with digital dictation and possible smart phrase technology. Any transcriptional errors that result from this process are unintentional.

## 2021-11-18 ENCOUNTER — Other Ambulatory Visit (HOSPITAL_COMMUNITY): Payer: Self-pay

## 2021-11-18 ENCOUNTER — Other Ambulatory Visit: Payer: Self-pay | Admitting: Adult Health

## 2021-11-18 ENCOUNTER — Ambulatory Visit: Payer: Managed Care, Other (non HMO) | Admitting: Adult Health

## 2021-11-18 ENCOUNTER — Encounter: Payer: Self-pay | Admitting: Adult Health

## 2021-11-18 VITALS — BP 119/77 | HR 66 | Ht 66.0 in | Wt 220.0 lb

## 2021-11-18 DIAGNOSIS — G629 Polyneuropathy, unspecified: Secondary | ICD-10-CM | POA: Diagnosis not present

## 2021-11-18 DIAGNOSIS — I639 Cerebral infarction, unspecified: Secondary | ICD-10-CM | POA: Diagnosis not present

## 2021-11-18 DIAGNOSIS — G2581 Restless legs syndrome: Secondary | ICD-10-CM | POA: Diagnosis not present

## 2021-11-18 DIAGNOSIS — Z9189 Other specified personal risk factors, not elsewhere classified: Secondary | ICD-10-CM

## 2021-11-18 DIAGNOSIS — I4891 Unspecified atrial fibrillation: Secondary | ICD-10-CM

## 2021-11-18 MED ORDER — TOPIRAMATE 50 MG PO TABS
50.0000 mg | ORAL_TABLET | Freq: Two times a day (BID) | ORAL | 5 refills | Status: DC
  Filled 2021-11-18: qty 60, 30d supply, fill #0
  Filled 2022-01-06: qty 60, 30d supply, fill #1
  Filled 2022-02-13: qty 60, 30d supply, fill #2

## 2021-11-18 MED ORDER — CLOPIDOGREL BISULFATE 75 MG PO TABS
75.0000 mg | ORAL_TABLET | Freq: Every day | ORAL | 3 refills | Status: DC
  Filled 2021-11-18 (×2): qty 90, 90d supply, fill #0
  Filled 2022-02-20: qty 90, 90d supply, fill #1
  Filled 2022-05-15: qty 90, 90d supply, fill #2
  Filled 2022-09-01: qty 90, 90d supply, fill #3

## 2021-11-18 NOTE — Patient Instructions (Addendum)
Your Plan:  Increase topamax to '50mg'$  twice daily - can increase night time dose to '50mg'$  for the next few days then can increase morning dose to '50mg'$  as well after that   Restart plavix and stop aspirin, continue atorvastatin  You will be called to schedule a 30 day cardiac monitor to evaluate for any abnormal heart rhythms that could have contributed to your stroke  We will check some lab work today  You will be called to schedule a sleep consult to ensure you do not have sleep apnea as this can increase your stroke risk    Follow up in 6 months or call earlier if needed      Thank you for coming to see [place] at [facility]. I hope we have been able to provide you high quality care today.  You may receive a patient satisfaction survey over the next few weeks. We would appreciate your feedback and comments so that we may continue to improve ourselves and the health of our patients.

## 2021-11-19 LAB — VITAMIN B12: Vitamin B-12: 518 pg/mL (ref 232–1245)

## 2021-11-19 LAB — IRON,TIBC AND FERRITIN PANEL
Ferritin: 26 ng/mL (ref 15–150)
Iron Saturation: 20 % (ref 15–55)
Iron: 88 ug/dL (ref 27–159)
Total Iron Binding Capacity: 431 ug/dL (ref 250–450)
UIBC: 343 ug/dL (ref 131–425)

## 2021-11-19 LAB — THYROID PANEL WITH TSH
Free Thyroxine Index: 1.5 (ref 1.2–4.9)
T3 Uptake Ratio: 23 % — ABNORMAL LOW (ref 24–39)
T4, Total: 6.7 ug/dL (ref 4.5–12.0)
TSH: 1.28 u[IU]/mL (ref 0.450–4.500)

## 2021-11-22 ENCOUNTER — Other Ambulatory Visit (HOSPITAL_COMMUNITY): Payer: Self-pay

## 2021-12-01 ENCOUNTER — Telehealth: Payer: Self-pay | Admitting: Adult Health

## 2021-12-01 NOTE — Telephone Encounter (Signed)
Called patient and advised her to call cardiology office and noted to her she had a my chart sent to her 11/18/21 with all instructions. Patient verbalized understanding, appreciation.

## 2021-12-01 NOTE — Telephone Encounter (Signed)
Pt would like a call from the nurse discuss if can come by the office to be shown how to use the monitor for my heart.

## 2021-12-02 ENCOUNTER — Ambulatory Visit (INDEPENDENT_AMBULATORY_CARE_PROVIDER_SITE_OTHER): Payer: Managed Care, Other (non HMO)

## 2021-12-02 DIAGNOSIS — I4891 Unspecified atrial fibrillation: Secondary | ICD-10-CM

## 2021-12-02 DIAGNOSIS — I639 Cerebral infarction, unspecified: Secondary | ICD-10-CM

## 2021-12-09 ENCOUNTER — Encounter: Payer: Self-pay | Admitting: Neurology

## 2021-12-09 ENCOUNTER — Ambulatory Visit: Payer: Managed Care, Other (non HMO) | Admitting: Neurology

## 2021-12-09 VITALS — BP 101/64 | HR 58 | Ht 66.0 in | Wt 222.2 lb

## 2021-12-09 DIAGNOSIS — G2581 Restless legs syndrome: Secondary | ICD-10-CM

## 2021-12-09 DIAGNOSIS — R351 Nocturia: Secondary | ICD-10-CM

## 2021-12-09 DIAGNOSIS — R0683 Snoring: Secondary | ICD-10-CM | POA: Diagnosis not present

## 2021-12-09 DIAGNOSIS — I639 Cerebral infarction, unspecified: Secondary | ICD-10-CM | POA: Diagnosis not present

## 2021-12-09 DIAGNOSIS — G4761 Periodic limb movement disorder: Secondary | ICD-10-CM

## 2021-12-09 DIAGNOSIS — G4719 Other hypersomnia: Secondary | ICD-10-CM

## 2021-12-09 DIAGNOSIS — E669 Obesity, unspecified: Secondary | ICD-10-CM

## 2021-12-09 NOTE — Patient Instructions (Signed)

## 2021-12-13 ENCOUNTER — Telehealth: Payer: Self-pay | Admitting: Neurology

## 2021-12-19 ENCOUNTER — Other Ambulatory Visit (INDEPENDENT_AMBULATORY_CARE_PROVIDER_SITE_OTHER): Payer: Self-pay | Admitting: Primary Care

## 2021-12-19 DIAGNOSIS — Z760 Encounter for issue of repeat prescription: Secondary | ICD-10-CM

## 2021-12-19 DIAGNOSIS — A6000 Herpesviral infection of urogenital system, unspecified: Secondary | ICD-10-CM

## 2021-12-19 MED ORDER — VALACYCLOVIR HCL 500 MG PO TABS
250.0000 mg | ORAL_TABLET | Freq: Every day | ORAL | 3 refills | Status: DC
  Filled 2021-12-19: qty 45, 90d supply, fill #0
  Filled 2022-03-20: qty 45, 90d supply, fill #1
  Filled 2022-07-03: qty 45, 90d supply, fill #2
  Filled 2022-09-25: qty 45, 90d supply, fill #3
  Filled 2022-12-18: qty 45, 90d supply, fill #4

## 2021-12-20 ENCOUNTER — Other Ambulatory Visit (HOSPITAL_COMMUNITY): Payer: Self-pay

## 2021-12-30 ENCOUNTER — Ambulatory Visit (INDEPENDENT_AMBULATORY_CARE_PROVIDER_SITE_OTHER): Payer: Managed Care, Other (non HMO) | Admitting: Primary Care

## 2021-12-30 NOTE — Telephone Encounter (Signed)
[employer] denied the NPSG.  HST- no auth req spoke to [place] ref # [id number].  I spoke with the patient she informed me she is wearing a heart monitor right now once she gets that removed she stated she wcb to schedule the HST.

## 2021-12-31 ENCOUNTER — Telehealth: Payer: Self-pay | Admitting: Neurology

## 2021-12-31 NOTE — Telephone Encounter (Signed)
I received text message from answering service about this patient requesting a call.  I spoke to her.  She is currently wearing a 30-day heart monitor but she is out of it is chest pads.  I advised her to look at the box in which the monitor was sent and call the appropriate cardiology office which sent the monitor to her.  Unfortunately we do not carry those strips in our office and hence we will not able to help her in that regards.  She voiced understanding

## 2022-01-06 ENCOUNTER — Other Ambulatory Visit (HOSPITAL_COMMUNITY): Payer: Self-pay

## 2022-01-06 ENCOUNTER — Other Ambulatory Visit (INDEPENDENT_AMBULATORY_CARE_PROVIDER_SITE_OTHER): Payer: Self-pay | Admitting: Primary Care

## 2022-01-06 DIAGNOSIS — I639 Cerebral infarction, unspecified: Secondary | ICD-10-CM

## 2022-01-06 DIAGNOSIS — Z760 Encounter for issue of repeat prescription: Secondary | ICD-10-CM

## 2022-01-06 DIAGNOSIS — I10 Essential (primary) hypertension: Secondary | ICD-10-CM

## 2022-01-07 ENCOUNTER — Other Ambulatory Visit (HOSPITAL_COMMUNITY): Payer: Self-pay

## 2022-01-07 ENCOUNTER — Other Ambulatory Visit (INDEPENDENT_AMBULATORY_CARE_PROVIDER_SITE_OTHER): Payer: Self-pay | Admitting: Primary Care

## 2022-01-07 DIAGNOSIS — Z760 Encounter for issue of repeat prescription: Secondary | ICD-10-CM

## 2022-01-07 DIAGNOSIS — I639 Cerebral infarction, unspecified: Secondary | ICD-10-CM

## 2022-01-07 DIAGNOSIS — I10 Essential (primary) hypertension: Secondary | ICD-10-CM

## 2022-01-07 MED ORDER — ATORVASTATIN CALCIUM 80 MG PO TABS
80.0000 mg | ORAL_TABLET | Freq: Every day | ORAL | 0 refills | Status: DC
  Filled 2022-01-07 – 2022-01-13 (×2): qty 30, 30d supply, fill #0

## 2022-01-07 MED ORDER — LOSARTAN POTASSIUM 25 MG PO TABS
25.0000 mg | ORAL_TABLET | Freq: Every day | ORAL | 0 refills | Status: DC
  Filled 2022-01-07 – 2022-01-13 (×2): qty 30, 30d supply, fill #0

## 2022-01-10 ENCOUNTER — Other Ambulatory Visit (HOSPITAL_COMMUNITY): Payer: Self-pay

## 2022-01-12 ENCOUNTER — Other Ambulatory Visit (HOSPITAL_COMMUNITY): Payer: Self-pay

## 2022-01-12 ENCOUNTER — Other Ambulatory Visit (INDEPENDENT_AMBULATORY_CARE_PROVIDER_SITE_OTHER): Payer: Self-pay | Admitting: Adult Health

## 2022-01-12 DIAGNOSIS — Z760 Encounter for issue of repeat prescription: Secondary | ICD-10-CM

## 2022-01-12 DIAGNOSIS — I10 Essential (primary) hypertension: Secondary | ICD-10-CM

## 2022-01-12 DIAGNOSIS — I639 Cerebral infarction, unspecified: Secondary | ICD-10-CM

## 2022-01-13 ENCOUNTER — Other Ambulatory Visit: Payer: Self-pay

## 2022-01-13 ENCOUNTER — Encounter: Payer: Self-pay | Admitting: Adult Health

## 2022-01-13 ENCOUNTER — Other Ambulatory Visit (HOSPITAL_COMMUNITY): Payer: Self-pay

## 2022-01-13 NOTE — Telephone Encounter (Signed)
PCP recently filled. She can look at [facility] website to find a provider that is excepting new patients and schedule right online. Can provide additional refill down the road if indicated but would highly encourage she works on finding a PCP. Thank you.

## 2022-01-14 ENCOUNTER — Telehealth: Payer: Self-pay | Admitting: *Deleted

## 2022-01-14 DIAGNOSIS — I639 Cerebral infarction, unspecified: Secondary | ICD-10-CM

## 2022-01-14 NOTE — Telephone Encounter (Signed)
-----   Message from Jettie Booze, MD sent at 01/11/2022 11:02 PM EDT ----- Please see referral  from Dr. Leonie Man for loop recorder.  Would refer to EP with Dr. Leonie Man being the referring Dr.   Saundra Shelling ----- Message ----- From: Garvin Fila, MD Sent: 01/10/2022   5:47 PM EDT To: Jettie Booze, MD  Agree with plan for loop recorder if patient is willing ----- Message ----- From: Jettie Booze, MD Sent: 01/08/2022  10:44 PM EDT To: Garvin Fila, MD

## 2022-01-14 NOTE — Telephone Encounter (Signed)
I placed call to patient to see if she would like to proceed with EP referral.  Left message to call office

## 2022-01-17 ENCOUNTER — Other Ambulatory Visit (HOSPITAL_COMMUNITY): Payer: Self-pay

## 2022-01-17 ENCOUNTER — Other Ambulatory Visit (INDEPENDENT_AMBULATORY_CARE_PROVIDER_SITE_OTHER): Payer: Self-pay | Admitting: Primary Care

## 2022-01-17 DIAGNOSIS — Z760 Encounter for issue of repeat prescription: Secondary | ICD-10-CM

## 2022-01-17 DIAGNOSIS — F331 Major depressive disorder, recurrent, moderate: Secondary | ICD-10-CM

## 2022-01-17 MED ORDER — ESCITALOPRAM OXALATE 10 MG PO TABS
10.0000 mg | ORAL_TABLET | Freq: Every day | ORAL | 0 refills | Status: DC
  Filled 2022-01-17: qty 30, 30d supply, fill #0

## 2022-01-28 NOTE — Telephone Encounter (Signed)
Left message to call office

## 2022-02-09 ENCOUNTER — Encounter (INDEPENDENT_AMBULATORY_CARE_PROVIDER_SITE_OTHER): Payer: Self-pay | Admitting: Primary Care

## 2022-02-09 ENCOUNTER — Other Ambulatory Visit (HOSPITAL_COMMUNITY): Payer: Self-pay

## 2022-02-09 ENCOUNTER — Ambulatory Visit (INDEPENDENT_AMBULATORY_CARE_PROVIDER_SITE_OTHER): Payer: Managed Care, Other (non HMO) | Admitting: Primary Care

## 2022-02-09 VITALS — BP 121/80 | HR 63 | Temp 98.1°F | Ht 66.0 in | Wt 221.4 lb

## 2022-02-09 DIAGNOSIS — Z23 Encounter for immunization: Secondary | ICD-10-CM

## 2022-02-09 DIAGNOSIS — I10 Essential (primary) hypertension: Secondary | ICD-10-CM

## 2022-02-09 DIAGNOSIS — I639 Cerebral infarction, unspecified: Secondary | ICD-10-CM

## 2022-02-09 DIAGNOSIS — Z760 Encounter for issue of repeat prescription: Secondary | ICD-10-CM

## 2022-02-09 DIAGNOSIS — Z6835 Body mass index (BMI) 35.0-35.9, adult: Secondary | ICD-10-CM

## 2022-02-09 DIAGNOSIS — F331 Major depressive disorder, recurrent, moderate: Secondary | ICD-10-CM

## 2022-02-09 MED ORDER — LOSARTAN POTASSIUM 25 MG PO TABS
25.0000 mg | ORAL_TABLET | Freq: Every day | ORAL | 1 refills | Status: DC
  Filled 2022-02-09: qty 90, 90d supply, fill #0
  Filled 2022-05-01: qty 90, 90d supply, fill #1

## 2022-02-09 MED ORDER — ATORVASTATIN CALCIUM 80 MG PO TABS
80.0000 mg | ORAL_TABLET | Freq: Every day | ORAL | 1 refills | Status: DC
  Filled 2022-02-09: qty 90, 90d supply, fill #0
  Filled 2022-05-01: qty 90, 90d supply, fill #1

## 2022-02-09 MED ORDER — ESCITALOPRAM OXALATE 10 MG PO TABS
10.0000 mg | ORAL_TABLET | Freq: Every day | ORAL | 1 refills | Status: DC
  Filled 2022-02-09 – 2022-02-20 (×2): qty 90, 90d supply, fill #0
  Filled 2022-05-15: qty 90, 90d supply, fill #1

## 2022-02-09 NOTE — Progress Notes (Signed)
Sarah Simpson is a 56 y.o. female presents for hypertension evaluation, Denies shortness of breath, headaches, chest pain or lower extremity edema, sudden onset, vision changes, unilateral weakness, dizziness, paresthesias   Patient reports adherence with medications.  Dietary habits include:  Dash diet (trying to do better) Exercise habits include:walking Family / Social history: None   Past Medical History:  Diagnosis Date   ADD (attention deficit disorder)    Allergy    Anemia    Arthritis    OA back    Cyst of right ovary    Depression    Heart murmur    History of kidney stones    Hypertension    Neuromuscular disorder ([place])    RLS   Restless leg syndrome    Seizures ([place])    in her 20's- none since-? etiology    Past Surgical History:  Procedure Laterality Date   APPENDECTOMY  2008   BUBBLE STUDY  09/18/2020   Procedure: BUBBLE STUDY;  Surgeon: Fay Records, MD;  Location: [facility] ENDOSCOPY;  Service: Cardiovascular;;   BUNIONECTOMY  2009   x 2   TEE WITHOUT CARDIOVERSION N/A 09/18/2020   Procedure: TRANSESOPHAGEAL ECHOCARDIOGRAM (TEE);  Surgeon: Fay Records, MD;  Location: [facility] ENDOSCOPY;  Service: Cardiovascular;  Laterality: N/A;   WISDOM TOOTH EXTRACTION     Allergies  Allergen Reactions   Ciprofloxacin Swelling   Levaquin [Levofloxacin] Anaphylaxis    Hives and throat swelling    Amoxil [Amoxicillin] Rash    Extreme itching limbs and trunk    Dynacin [Minocycline] Rash   Sulfamethazine Rash   Current Outpatient Medications on File Prior to Visit  Medication Sig Dispense Refill   acetaminophen (TYLENOL) 325 MG tablet Take 650 mg by mouth as needed for moderate pain or headache.     atorvastatin (LIPITOR) 80 MG tablet Take 1 tablet (80 mg total) by mouth daily at 6 PM. 30 tablet 0   clopidogrel (PLAVIX) 75 MG tablet Take 1 tablet by mouth daily. 90 tablet 3   diphenhydrAMINE (BENADRYL) 25 MG tablet Take 12.5 mg by mouth at  bedtime.     escitalopram (LEXAPRO) 10 MG tablet TAKE 1 TABLET BY MOUTH DAILY. 30 tablet 0   losartan (COZAAR) 25 MG tablet Take 1 tablet (25 mg total) by mouth daily. 30 tablet 0   Melatonin 10 MG TABS Take 10 mg by mouth at bedtime.     Multiple Vitamin (MULTIVITAMIN) tablet Take 1 tablet by mouth daily.     Pseudoeph-Doxylamine-DM-APAP (NYQUIL PO) Take by mouth.     topiramate (TOPAMAX) 50 MG tablet Take 1 tablet by mouth 2 times daily. 60 tablet 5   valACYclovir (VALTREX) 500 MG tablet Take 1/2 tablet by mouth daily. 90 tablet 3   No current facility-administered medications on file prior to visit.   Social History   Socioeconomic History   Marital status: Single    Spouse name: Not on file   Number of children: Not on file   Years of education: Not on file   Highest education level: Not on file  Occupational History   Not on file  Tobacco Use   Smoking status: Former   Smokeless tobacco: Never   Tobacco comments:    on and off in the past, quit in 2002  Vaping Use   Vaping Use: Never used  Substance and Sexual Activity   Alcohol use: Yes    Comment: occ   Drug use: Never  Sexual activity: Not on file  Other Topics Concern   Not on file  Social History Narrative   Work or School: Biochemist, clinical - [employer]      Home Situation: none      Spiritual Beliefs: Christian      Lifestyle: no regular exercise; so so            Social Determinants of Radio broadcast assistant Strain: Not on file  Food Insecurity: Not on file  Transportation Needs: Not on file  Physical Activity: Not on file  Stress: Not on file  Social Connections: Not on file  Intimate Partner Violence: Not on file   Family History  Problem Relation Age of Onset   Colon cancer Mother 61   Heart attack Mother    Dementia Mother    Colon cancer Father 94   Colon polyps Neg Hx    Esophageal cancer Neg Hx    Stomach cancer Neg Hx    Rectal cancer Neg Hx    Sleep apnea Neg Hx       OBJECTIVE:  Vitals:   02/09/22 1041  BP: 121/80  Pulse: 63  Temp: 98.1 F (36.7 C)  TempSrc: Oral  SpO2: 94%  Weight: 221 lb 6.4 oz (100.4 kg)  Height: _0  (1.676 m)   Physical exam: General: Vital signs reviewed.  Patient is well-developed and well-nourished, obese female in no acute distress and cooperative with exam. Head: Normocephalic and atraumatic. Eyes: EOMI, conjunctivae normal, no scleral icterus. Neck: Supple, trachea midline, normal ROM, no JVD, masses, thyromegaly, or carotid bruit present. Cardiovascular: RRR, S1 normal, S2 normal, no murmurs, gallops, or rubs. Pulmonary/Chest: Clear to auscultation bilaterally, no wheezes, rales, or rhonchi. Abdominal: Soft, non-tender, non-distended, BS +, no masses, organomegaly, or guarding present. Musculoskeletal: No joint deformities, erythema, or stiffness, ROM full and nontender. Extremities: No lower extremity edema bilaterally,  pulses symmetric and intact bilaterally. No cyanosis or clubbing. Neurological: A&O x3, Strength is normal Skin: Warm, dry and intact. No rashes or erythema. Psychiatric: Normal mood and affect. speech and behavior is normal. Cognition and memory are normal.     ROS Comprehensive ROS Pertinent positive and negative noted in HPI   Last 3 Office BP readings: BP Readings from Last 3 Encounters:  02/09/22 121/80  12/09/21 101/64  11/18/21 119/77    BMET    Component Value Date/Time   NA 141 04/01/2021 0912   K 4.3 04/01/2021 0912   CL 109 (H) 04/01/2021 0912   CO2 22 04/01/2021 0912   GLUCOSE 114 (H) 04/01/2021 0912   GLUCOSE 150 (H) 11/28/2020 2329   BUN 12 04/01/2021 0912   CREATININE 0.63 04/01/2021 0912   CALCIUM 9.2 04/01/2021 0912   GFRNONAA >60 11/28/2020 2316   GFRAA >60 02/05/2017 1525    Renal function: CrCl cannot be calculated (Patient's most recent lab result is older than the maximum 21 days allowed.).  Clinical ASCVD: Yes  The ASCVD Risk score (Arnett DK,  et al., 2019) failed to calculate for the following reasons:   The patient has a prior MI or stroke diagnosis  ASCVD risk factors include- [place]   ASSESSMENT & PLAN: Sarah Simpson was seen today for hypertension and medication refill.  Diagnoses and all orders for this visit:   Benign essential hypertension Well control  blood pressure controlling including reduced dietary sodium, increased exercise, weight reduction and adequate sleep. Also, educated patient about the risk for cardiovascular events, stroke and heart attack. Also counseled patient  about the importance of medication adherence. If you participate in smoking, it is important to stop using tobacco as this will increase the risks associated with uncontrolled blood pressure.   Goal BP:  For patients younger than 60: Goal BP < 130/80. For patients 60 and older: Goal BP < 140/90. For patients with diabetes: Goal BP < 130/80. Your most recent BP: 121/80  Minimize salt intake. Minimize alcohol intake -     CMP14+EGFR  Medication refill -     losartan (COZAAR) 25 MG tablet; Take 1 tablet (25 mg total) by mouth daily. -     escitalopram (LEXAPRO) 10 MG tablet; TAKE 1 TABLET BY MOUTH DAILY. -     atorvastatin (LIPITOR) 80 MG tablet; Take 1 tablet (80 mg total) by mouth daily at 6 PM.  Moderate recurrent major depression (HCC) -     escitalopram (LEXAPRO) 10 MG tablet; TAKE 1 TABLET BY MOUTH DAILY.  history of (cerebrovascular accident) ([place]) Followed by neurology -     atorvastatin (LIPITOR) 80 MG tablet; Take 1 tablet (80 mg total) by mouth daily at 6 PM. -     Lipid panel; Future  Need for shingles vaccine -     Varicella-zoster vaccine subcutaneous   Class 2 severe obesity due to excess calories with serious comorbidity and body mass index (BMI) of 35.0 to 35.9 in adult [facility]) Obesity is 30-39 indicating an excess in caloric intake or underlining conditions.  Comorbidities are present hypertension and a history of CVA.  Lifestyle modifications of diet and exercise may reduce obesity.    This note has been created with Surveyor, quantity. Any transcriptional errors are unintentional.   Kerin Perna, NP 02/09/2022, 10:51 AM

## 2022-02-09 NOTE — Patient Instructions (Addendum)
Recombinant Zoster (Shingles) Vaccine: What You Need to Know 1. Why get vaccinated? Recombinant zoster (shingles) vaccine can prevent shingles. Shingles (also called herpes zoster, or just zoster) is a painful skin rash, usually with blisters. In addition to the rash, shingles can cause fever, headache, chills, or upset stomach. Rarely, shingles can lead to complications such as pneumonia, hearing problems, blindness, brain inflammation (encephalitis), or death. The risk of shingles increases with age. The most common complication of shingles is long-term nerve pain called postherpetic neuralgia (PHN). PHN occurs in the areas where the shingles rash was and can last for months or years after the rash goes away. The pain from PHN can be severe and debilitating. The risk of PHN increases with age. An older adult with shingles is more likely to develop PHN and have longer lasting and more severe pain than a younger person. People with weakened immune systems also have a higher risk of getting shingles and complications from the disease. Shingles is caused by varicella-zoster virus, the same virus that causes chickenpox. After you have chickenpox, the virus stays in your body and can cause shingles later in life. Shingles cannot be passed from one person to another, but the virus that causes shingles can spread and cause chickenpox in someone who has never had chickenpox or has never received chickenpox vaccine. 2. Recombinant shingles vaccine Recombinant shingles vaccine provides strong protection against shingles. By preventing shingles, recombinant shingles vaccine also protects against PHN and other complications. Recombinant shingles vaccine is recommended for: Adults 68 years and older Adults 19 years and older who have a weakened immune system because of disease or treatments Shingles vaccine is given as a two-dose series. For most people, the second dose should be given 2 to 6 months after the first  dose. Some people who have or will have a weakened immune system can get the second dose 1 to 2 months after the first dose. Ask your health care provider for guidance. People who have had shingles in the past and people who have received varicella (chickenpox) vaccine are recommended to get recombinant shingles vaccine. The vaccine is also recommended for people who have already gotten another type of shingles vaccine, the live shingles vaccine. There is no live virus in recombinant shingles vaccine. Shingles vaccine may be given at the same time as other vaccines. 3. Talk with your health care provider Tell your vaccination provider if the person getting the vaccine: Has had an allergic reaction after a previous dose of recombinant shingles vaccine, or has any severe, life-threatening allergies Is currently experiencing an episode of shingles Is pregnant In some cases, your health care provider may decide to postpone shingles vaccination until a future visit. People with minor illnesses, such as a cold, may be vaccinated. People who are moderately or severely ill should usually wait until they recover before getting recombinant shingles vaccine. Your health care provider can give you more information. 4. Risks of a vaccine reaction A sore arm with mild or moderate pain is very common after recombinant shingles vaccine. Redness and swelling can also happen at the site of the injection. Tiredness, muscle pain, headache, shivering, fever, stomach pain, and nausea are common after recombinant shingles vaccine. These side effects may temporarily prevent a vaccinated person from doing regular activities. Symptoms usually go away on their own in 2 to 3 days. You should still get the second dose of recombinant shingles vaccine even if you had one of these reactions after the first dose. Guillain-Barr  syndrome (GBS), a serious nervous system disorder, has been reported very rarely after recombinant zoster  vaccine. People sometimes faint after medical procedures, including vaccination. Tell your provider if you feel dizzy or have vision changes or ringing in the ears. As with any medicine, there is a very remote chance of a vaccine causing a severe allergic reaction, other serious injury, or death. 5. What if there is a serious problem? An allergic reaction could occur after the vaccinated person leaves the clinic. If you see signs of a severe allergic reaction (hives, swelling of the face and throat, difficulty breathing, a fast heartbeat, dizziness, or weakness), call 9-1-1 and get the person to the nearest hospital. For other signs that concern you, call your health care provider. Adverse reactions should be reported to the Vaccine Adverse Event Reporting System (VAERS). Your health care provider will usually file this report, or you can do it yourself. Visit the VAERS website at www.vaers.SamedayNews.es or call (847) 603-3619. VAERS is only for reporting reactions, and VAERS staff members do not give medical advice. 6. How can I learn more? Ask your health care provider. Call your local or state health department. Visit the website of the Food and Drug Administration (FDA) for vaccine package inserts and additional information at http://lopez-wang.org/. Contact the Centers for Disease Control and Prevention (CDC): Call 289-413-8684 (1-800-CDC-INFO) or Visit CDC's website at http://hunter.com/. Source: CDC Vaccine Information Statement Recombinant Zoster Vaccine (07/24/2020) This same material is available at http://www.wolf.info/ for no charge. This information is not intended to replace advice given to you by your health care provider. Make sure you discuss any questions you have with your health care provider. Document Revised: 05/05/2021 Document Reviewed: 08/07/2020 Elsevier Patient Education  [place].

## 2022-02-10 ENCOUNTER — Other Ambulatory Visit (HOSPITAL_COMMUNITY): Payer: Self-pay

## 2022-02-14 ENCOUNTER — Other Ambulatory Visit: Payer: Self-pay | Admitting: Adult Health

## 2022-02-14 ENCOUNTER — Other Ambulatory Visit (HOSPITAL_COMMUNITY): Payer: Self-pay

## 2022-02-14 DIAGNOSIS — G629 Polyneuropathy, unspecified: Secondary | ICD-10-CM

## 2022-02-14 DIAGNOSIS — G2581 Restless legs syndrome: Secondary | ICD-10-CM

## 2022-02-14 MED ORDER — TOPIRAMATE 50 MG PO TABS
50.0000 mg | ORAL_TABLET | Freq: Two times a day (BID) | ORAL | 3 refills | Status: DC
  Filled 2022-02-14: qty 180, 90d supply, fill #0
  Filled 2022-05-15: qty 180, 90d supply, fill #1
  Filled 2022-09-01: qty 180, 90d supply, fill #2

## 2022-02-16 NOTE — Telephone Encounter (Signed)
Patient has not returned call.  Will place referral.

## 2022-02-21 ENCOUNTER — Other Ambulatory Visit (HOSPITAL_COMMUNITY): Payer: Self-pay

## 2022-02-22 ENCOUNTER — Other Ambulatory Visit (HOSPITAL_COMMUNITY): Payer: Self-pay

## 2022-03-03 ENCOUNTER — Encounter: Payer: Self-pay | Admitting: Interventional Cardiology

## 2022-03-21 ENCOUNTER — Other Ambulatory Visit (HOSPITAL_COMMUNITY): Payer: Self-pay

## 2022-03-23 ENCOUNTER — Other Ambulatory Visit (HOSPITAL_COMMUNITY): Payer: Self-pay

## 2022-05-02 ENCOUNTER — Other Ambulatory Visit (HOSPITAL_COMMUNITY): Payer: Self-pay

## 2022-05-10 IMAGING — DX DG CHEST 2V
2 series · 2 of 2 positions shown · non-contrast
Comparison: 02/16/2018

CLINICAL DATA: Cough, chest congestion

EXAM:
CHEST - 2 VIEW

[chest pa]
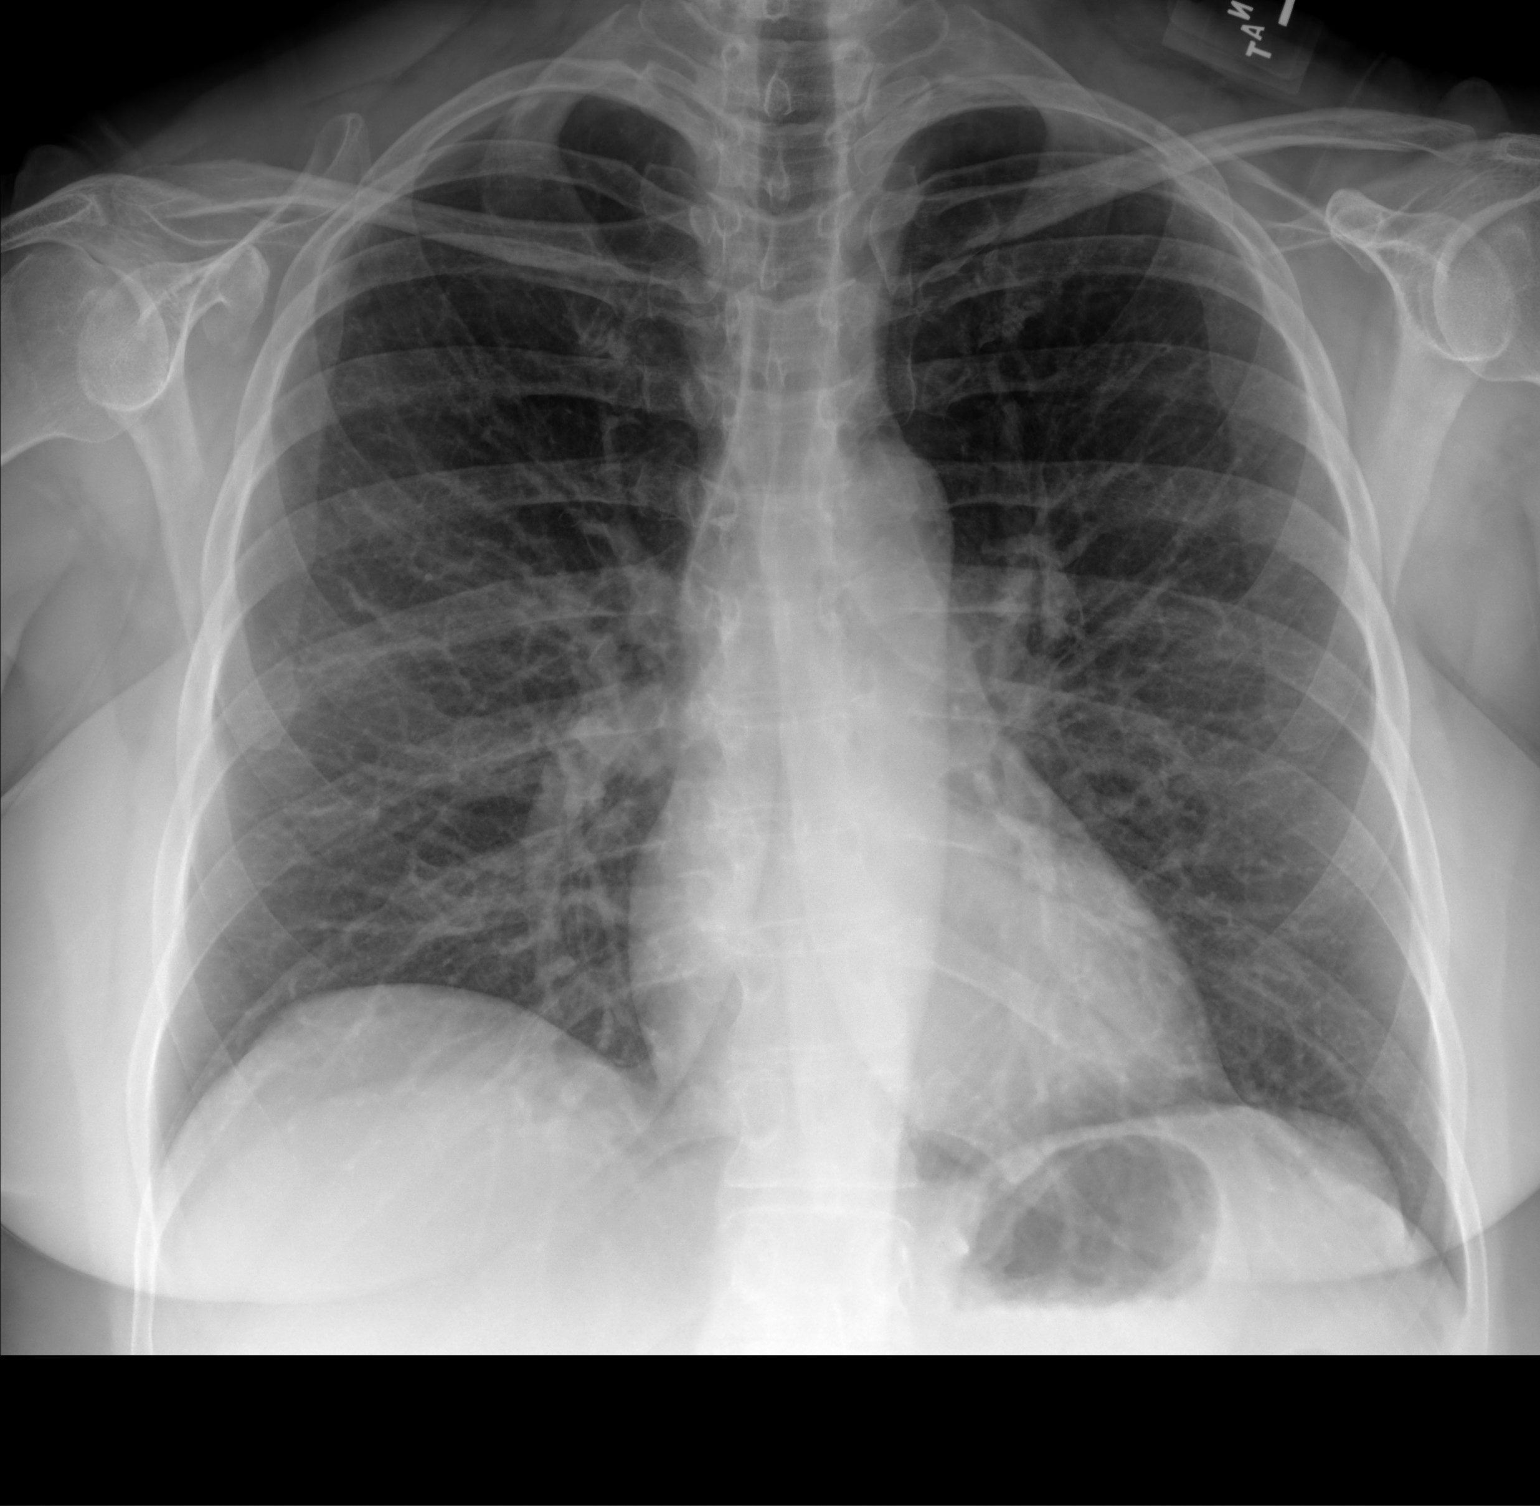

[chest lat]
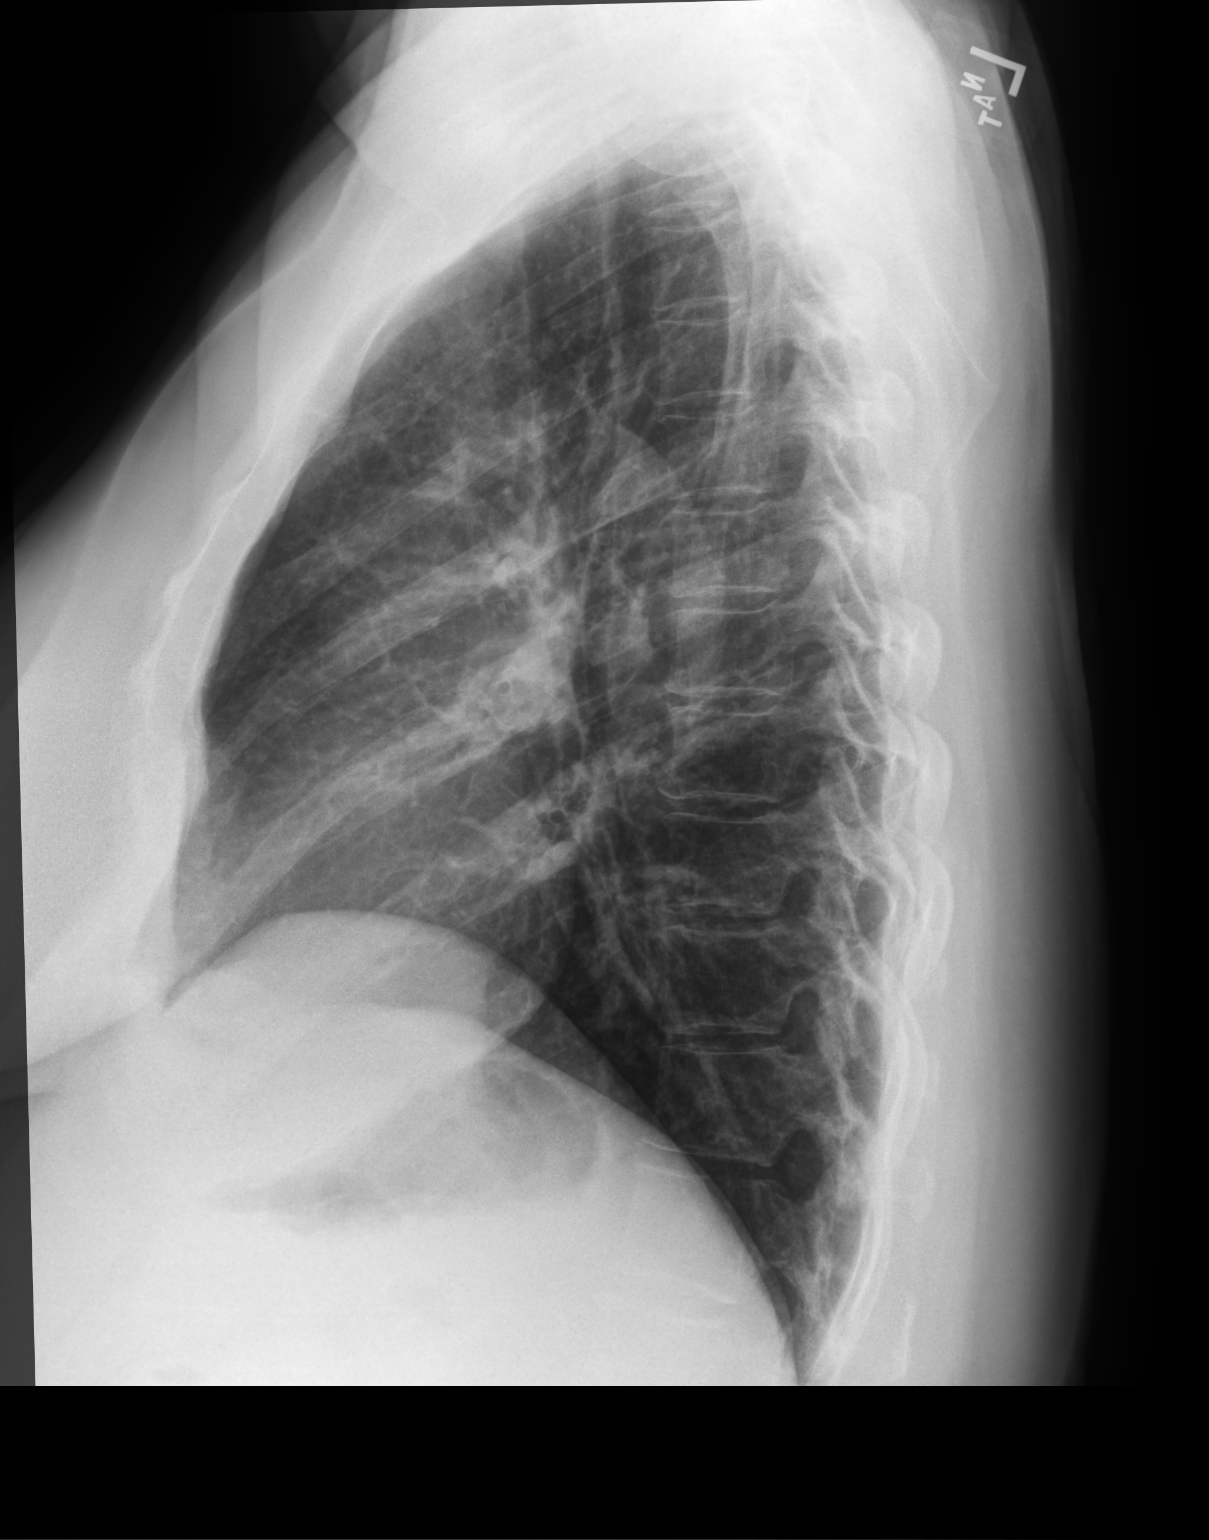

[2 of 2 positions shown; findings below may reference images not displayed]

FINDINGS: The heart size and mediastinal contours are within normal limits.
Both lungs are clear. The visualized skeletal structures are
unremarkable.
IMPRESSION: No active cardiopulmonary disease.

## 2022-05-15 ENCOUNTER — Other Ambulatory Visit (HOSPITAL_COMMUNITY): Payer: Self-pay

## 2022-05-16 ENCOUNTER — Other Ambulatory Visit (HOSPITAL_COMMUNITY): Payer: Self-pay

## 2022-05-24 NOTE — Progress Notes (Unsigned)
[name] [address] [place]. [place] [address] [phone]       STROKE FOLLOW UP NOTE  [name] Date of Birth:  02-09-1966 Medical Record Number:  [id number]   Reason for Referral: stroke follow up    SUBJECTIVE:   CHIEF COMPLAINT:  No chief complaint on file.    HPI:   Update 05/25/2022 JM: Patient returns for 74-monthstroke follow-up.  Overall stable without new stroke/TIA symptoms.  Reports residual right-sided dysesthesias, stable.  Remains on topiramate 50 mg twice daily for poststroke pain and chronic neuropathy.  She was evaluated by our sleep specialist Dr. ARexene Albertsback in June but has not yet proceeded with sleep study ***.   Completed cardiac monitor which was negative for A-fib, referral placed to EP for ILR evaluation but was unable to be contacted to schedule.   Remains on Plavix and atorvastatin Blood pressure well controlled       History provided for reference purposes only Update 11/17/2021 JM: Patient returns for stroke follow-up after prior visit 6 months ago.  She has been stable from stroke standpoint without new stroke/TIA symptoms.  Reports residual right sided dysthesia's which have been stable. Will also have occasional bilateral hand and feet neuropathy which is chronic and can fluctuate but does believe it has slightly worsened over the past few months.  She continues to work at [employer] [place] majority of the day. Neuropathy does not specifically worsen with any activity or movement. Neuropathy is not debilitating.  She has remained on topiramate 25 mg twice daily, denies side effects.  She questions possibly increasing dosage.  Does have chronic issues with sleeping, uses OTC sleeping aides. She has had prior sleep study likely more than 10 years ago (unable to view via epic), does not believe it showed sleep apnea but did show restless leg syndrome.  She will have occasional difficulty with RLS  at night and what sounds like PLMD.  Does have day time fatigue, snores, insomnia, and nightly nocturia.   Was previously on Plavix but had difficulty obtaining refill therefore she has since been on aspirin 81 mg daily, denies side effects.  Compliant on atorvastatin, denies side effects.  Blood pressure today 119/77.    No further concerns at this time.  Update 05/20/2021 JM: Returns for stroke follow-up after prior visit with Dr. SLeonie Manapprox 5 mo ago for strokelike episode s/p tPA on 11/28/2020 and prior stroke 09/15/2020.  She has been stable from stroke standpoint without new stroke/TIA symptoms.  At prior visit, was switched from gabapentin to topiramate for poststroke paresthesias with significant improvement of tingling. Occasional "twinge" but doesn't last long. She had not had any additional severe headaches. Occasional mild headaches. EEG completed on 01/25/2021 to rule out seizures with remote history which was unremarkable. Reports continued poor vision chronically but worsened post stroke -previously discussed evaluation by ophthalmology which she now wishes to pursue as she is insured.  She is working at [employer]  Remains on Plavix and atorvastatin -denies side effects.  Blood pressure today 122/84 on losartan 25 mg daily.  No further concerns at this time  Update 12/14/2020 Dr. SLeonie Man She is seen today for follow-up after recent hospital admission from 11/28/2020 for strokelike symptoms.  She presented with sudden onset of right-sided weakness and numbness.  CT head was unremarkable CT angiogram of the head and neck did not show significant large vessel stenosis.  She received IV tPA and  did well.  An MRI scan of the brain was obtained which was negative for acute stroke.  LDL cholesterol was 39 mg percent hemoglobin A1c was 5.7.  Echocardiogram so not repeated as she had one in April 2022 TEE which was normal.  She had an EEG done this admission which was negative for  seizure activity.  Patient physical exam was consistent with functional components including lack of activation of the right cheek when asked to smile but she was able to puff her cheeks symmetrically or blow air with these and activated facial muscles when talking.  She was felt to have strokelike episode secondary to conversion reaction versus atypical migraine.  Patient states she is done well since discharge.  She has increase her gabapentin to 300 mg in the morning, afternoon and 600 at night but see still not happy and is having right-sided facial paresthesias.  Headaches seem under better control.  She is on aspirin and Plavix tolerating them well with minor bruising and no bleeding.  Blood pressures well controlled.  She is tolerating Lipitor well without muscle aches and pains.   Initial visit 10/22/2020 JM: [name] is being seen for hospital follow-up accompanied by her friend, Richard.  Reports residual blurred vision with reading and writing. Reports some visual impairment prior to her stroke having to use reading glasses but has had greater difficulty with reading even with using her reading glasses since her stroke.  She has returned back to almost all prior activities.  She has not worked since the onset of [place].  She is currently uninsured and in the process of applying for [employer] assistance program.  She also reports almost daily right sided headaches which have been present since her stroke.  She does have history of migraines but has not experienced any migraines since 01/2020 - these headaches are not consistent with her known migraines.  Reports continued constant bilateral hand and feet numbness/tingling.  Has been experiencing the symptoms for years but typically come and go but since her stroke they have been constant.  She will occasionally experience painful sensation.  She was prescribed gabapentin 100 mg 3 times daily at hospital discharge but she has been tolerating  but does not believe it has been beneficial  She has remained on DAPT despite 3-week recommendation but denies bleeding or bruising Remains on atorvastatin 80 mg daily without myalgias Blood pressure today 125/83  No further concerns at this time  Stroke admission 09/15/2020 56 year old female with a history of hypertension, seizure like episodes in her 58s, heart murmur, migraine headaches,, tobacco use, obesity, chronic parasthesias bilat hands, depression, RLS, ADD who presented on 09/15/2020 with facial droop and speech difficulties that started 3/28.  Personally reviewed hospitalization pertinent progress notes, lab work and imaging with summary provided.  Evaluated by Dr. Leonie Man with stroke work-up revealing acute subcortical infarct in the region of the caudate tail on the left due to large cortical infarct likely of cryptogenic etiology.  Recommended DAPT for 3 weeks and aspirin alone.  TEE negative for PFO or cardiac source of embolism identified.  Hypercoagulable labs negative. Loop recorder not placed prior to d/c. Hormonal therapy for postmenopausal hot flashes discontinued.  LDL 133 -recommend initiating atorvastatin 80 mg daily.  Complaints of bilateral paresthesias/headaches/rashes unclear etiology with brain and C-spine imaging no acute findings to explain her symptoms.  Vasculitis labs negative.  No prior stroke history.  Acute subcortical infarct in the region of the caudate tail on the left due  to large subcortical infarct likely of cryptogenic etiology   CT Code Stroke: A small age-indeterminate lacunar infarct is questioned within the left pons. There is no acute intracranial hemorrhage. MRI brain Diffusion abnormality in the caudate tail on the left most consistent with stroke. MRA head: Truncated examination. Small acute infarct of the left caudate tail. Normal intracranial MRA. Carotid Doppler Nearly normal bilaterally, no high grade stenosis 2D Echo EF 60-65%, No thrombus, no  wall motion abnormality or shunt found.  Bilat LE Doppler no evidence of DVT Hypercoagulable labs negative TEE EF 60 to 65%.  Negative for PFO.  No evidence of cardiac source of embolism identified LDL 133 HgbA1c 5.7 No antiplatelet or anticoagulant medications prior to admission Recommend DAPT with ASA '81mg'$  and Plavix '75mg'$  x 3 weeks then ASA '81mg'$  monotherapy Therapy recommendations:  none Disposition:   Home on 09/18/2020       ROS:   14 system review of systems performed and negative with exception of those listed in HPI  PMH:  Past Medical History:  Diagnosis Date   ADD (attention deficit disorder)    Allergy    Anemia    Arthritis    OA back    Cyst of right ovary    Depression    Heart murmur    History of kidney stones    Hypertension    Neuromuscular disorder ([place])    RLS   Restless leg syndrome    Seizures ([place])    in her 20's- none since-? etiology     PSH:  Past Surgical History:  Procedure Laterality Date   APPENDECTOMY  2008   BUBBLE STUDY  09/18/2020   Procedure: BUBBLE STUDY;  Surgeon: Fay Records, MD;  [facility]: [facility] ENDOSCOPY;  Service: Cardiovascular;;   BUNIONECTOMY  2009   x 2   TEE WITHOUT CARDIOVERSION N/A 09/18/2020   Procedure: TRANSESOPHAGEAL ECHOCARDIOGRAM (TEE);  Surgeon: Fay Records, MD;  Location: [facility] ENDOSCOPY;  Service: Cardiovascular;  Laterality: N/A;   WISDOM TOOTH EXTRACTION      Social History:  Social History   Socioeconomic History   Marital status: Single    Spouse name: Not on file   Number of children: Not on file   Years of education: Not on file   Highest education level: Not on file  Occupational History   Not on file  Tobacco Use   Smoking status: Former   Smokeless tobacco: Never   Tobacco comments:    on and off in the past, quit in 2002  Vaping Use   Vaping Use: Never used  Substance and Sexual Activity   Alcohol use: Yes    Comment: occ   Drug use: Never   Sexual activity: Not on file  Other Topics  Concern   Not on file  Social History Narrative   Work or School: Biochemist, clinical - [employer]      Home Situation: none      Spiritual Beliefs: Christian      Lifestyle: no regular exercise; so so            Social Determinants of Radio broadcast assistant Strain: Not on file  Food Insecurity: Not on file  Transportation Needs: Not on file  Physical Activity: Not on file  Stress: Not on file  Social Connections: Not on file  Intimate Partner Violence: Not on file    Family History:  Family History  Problem Relation Age of Onset   Colon cancer Mother 66  Heart attack Mother    Dementia Mother    Colon cancer Father 51   Colon polyps Neg Hx    Esophageal cancer Neg Hx    Stomach cancer Neg Hx    Rectal cancer Neg Hx    Sleep apnea Neg Hx     Medications:   Current Outpatient Medications on File Prior to Visit  Medication Sig Dispense Refill   acetaminophen (TYLENOL) 325 MG tablet Take 650 mg by mouth as needed for moderate pain or headache.     atorvastatin (LIPITOR) 80 MG tablet Take 1 tablet (80 mg total) by mouth daily at 6 PM. 90 tablet 1   clopidogrel (PLAVIX) 75 MG tablet Take 1 tablet by mouth daily. 90 tablet 3   diphenhydrAMINE (BENADRYL) 25 MG tablet Take 12.5 mg by mouth at bedtime.     escitalopram (LEXAPRO) 10 MG tablet TAKE 1 TABLET BY MOUTH DAILY. 90 tablet 1   losartan (COZAAR) 25 MG tablet Take 1 tablet (25 mg total) by mouth daily. 90 tablet 1   Melatonin 10 MG TABS Take 10 mg by mouth at bedtime.     Multiple Vitamin (MULTIVITAMIN) tablet Take 1 tablet by mouth daily.     Pseudoeph-Doxylamine-DM-APAP (NYQUIL PO) Take by mouth.     topiramate (TOPAMAX) 50 MG tablet Take 1 tablet by mouth 2 times daily. 180 tablet 3   valACYclovir (VALTREX) 500 MG tablet Take 1/2 tablet by mouth daily. 90 tablet 3   No current facility-administered medications on file prior to visit.    Allergies:   Allergies  Allergen Reactions   Ciprofloxacin Swelling    Levaquin [Levofloxacin] Anaphylaxis    Hives and throat swelling    Amoxil [Amoxicillin] Rash    Extreme itching limbs and trunk    Dynacin [Minocycline] Rash   Sulfamethazine Rash      OBJECTIVE:  Physical Exam  There were no vitals filed for this visit.  There is no height or weight on file to calculate BMI. No results found.   General: well developed, well nourished, very pleasant middle-age Caucasian female, seated, in no evident distress Head: head normocephalic and atraumatic.   Neck: supple with no carotid or supraclavicular bruits Cardiovascular: regular rate and rhythm, no murmurs Musculoskeletal: No abnormality Skin:  no rash/petichiae Vascular:  Normal pulses all extremities   Neurologic Exam Mental Status: Awake and fully alert.   Fluent speech and language.  Oriented to place and time. Recent and remote memory intact. Attention span, concentration and fund of knowledge appropriate. Mood and affect appropriate.  Cranial Nerves: Pupils equal, briskly reactive to light. Extraocular movements full without nystagmus. Visual fields full to confrontation. Hearing intact. Facial sensation intact. Face, tongue, palate moves normally and symmetrically.  Motor: Normal bulk and tone. Normal strength in all tested extremity muscles Sensory.: intact to touch , pinprick , position and vibratory sensation except subjective right-sided numbness and intermittent bilateral hand and foot numbness Coordination: Rapid alternating movements normal in all extremities. Finger-to-nose and heel-to-shin performed accurately bilaterally. Gait and Station: Arises from chair without difficulty. Stance is normal. Gait demonstrates normal stride length and balance without use of AD. Tandem walk and heel toe without difficulty Reflexes: 1+ and symmetric. Toes downgoing.        ASSESSMENT: Sarah Simpson is a 56 y.o. 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 Vascular risk factors include HTN, HLD, remote seizure history, migraine  headaches, former tobacco use and obesity.      PLAN:  Cryptogenic stroke: Strokelike episode: Residual deficit: post stroke dysesthesias  Increase topiramate to 50 mg twice daily Cardiac monitor negative for A-fib. Referral previously placed to EP for ILR evaluation *** Continue clopidogrel 75 mg daily and atorvastatin 80 mg daily for secondary stroke prevention -provided refill for Plavix but as Plavix and atorvastatin will be lifelong medications, request these are managed by PCP Discussed secondary stroke prevention measures and importance of close PCP follow up for aggressive stroke risk factor management including BP goal<130/90 and HLD with LDL goal<70  Bilateral hand and feet paresthesias: Chronic issue of unknown etiology reporting worsening post stroke.  Greatly improved initially after starting topiramate but has been gradually worsening.  Increase topiramate as noted above.  Obtain B12 and TSH. She does have pre-DM which may be a contributing factor.  She would like to hold off on EMG/NCV at this present time.  At risk for sleep apnea: Evaluated by Dr. Rexene Alberts 11/2021 for possible underlying sleep apnea but has not yet pursued sleep study.  She does report RLS and PLMD, lab work for reversible causes unremarkable.  Will obtain iron panel with hx of iron deficiency possibly contributing.  Symptoms not overly bothersome therefore will hold off on medication management.  C/o insomnia, snoring, daytime fatigue and nocturia.  She is currently being treated for HTN.  Remote seizure history: EEG 01/25/2021 unremarkable. Not currently on ASMs.  No indication at this time.  Continue to monitor.    Follow up in 6 months or call earlier if needed    CC:  PCP: Kerin Perna, NP    I spent 34 minutes of face-to-face and non-face-to-face  time with patient.  This included previsit chart review, lab review, study review, order entry, electronic health record documentation, patient education and discussion regarding history of prior stroke and possible etiologies and indication for further evaluation for cardiac monitoring, residual deficits and ongoing use of topiramate, secondary stroke prevention measures and aggressive stroke risk factor management, as well as discussed all above topics as noted and answered all other questions to patient satisfaction   [name], AGNP-BC  [facility] [address] [place], [place] [address]  Phone [phone] Fax [phone] Note: This document was prepared with digital dictation and possible smart phrase technology. Any transcriptional errors that result from this process are unintentional.

## 2022-05-25 ENCOUNTER — Ambulatory Visit: Payer: Managed Care, Other (non HMO) | Admitting: Adult Health

## 2022-05-25 ENCOUNTER — Other Ambulatory Visit (HOSPITAL_COMMUNITY): Payer: Self-pay

## 2022-05-25 ENCOUNTER — Encounter: Payer: Self-pay | Admitting: Adult Health

## 2022-05-25 VITALS — BP 117/70 | HR 62 | Ht 66.0 in | Wt 226.0 lb

## 2022-05-25 DIAGNOSIS — Z9189 Other specified personal risk factors, not elsewhere classified: Secondary | ICD-10-CM | POA: Diagnosis not present

## 2022-05-25 DIAGNOSIS — I639 Cerebral infarction, unspecified: Secondary | ICD-10-CM | POA: Diagnosis not present

## 2022-05-25 DIAGNOSIS — G629 Polyneuropathy, unspecified: Secondary | ICD-10-CM | POA: Diagnosis not present

## 2022-05-25 MED ORDER — GABAPENTIN 300 MG PO CAPS
300.0000 mg | ORAL_CAPSULE | Freq: Two times a day (BID) | ORAL | 0 refills | Status: DC
  Filled 2022-05-25: qty 60, 30d supply, fill #0

## 2022-05-25 NOTE — Patient Instructions (Addendum)
Recommend starting gabapentin '300mg'$  twice daily   Recommend starting at '300mg'$  nightly for a few days then add morning dose. If difficulty tolerating morning dose, can take 2 capsules at night  Continue topamax '50mg'$  twice daily for now - can consider decreasing once at a good dose of gabapentin  Please contact us if you would like to proceed with loop recorder implant  Please contact office when you are ready to proceed with sleep study     Follow up in 3 months or call earlier if needed

## 2022-07-04 ENCOUNTER — Other Ambulatory Visit (HOSPITAL_COMMUNITY): Payer: Self-pay

## 2022-07-27 ENCOUNTER — Other Ambulatory Visit: Payer: Self-pay | Admitting: Adult Health

## 2022-07-28 ENCOUNTER — Other Ambulatory Visit (HOSPITAL_COMMUNITY): Payer: Self-pay

## 2022-07-28 MED ORDER — GABAPENTIN 300 MG PO CAPS
300.0000 mg | ORAL_CAPSULE | Freq: Two times a day (BID) | ORAL | 2 refills | Status: DC
  Filled 2022-07-28: qty 60, 30d supply, fill #0
  Filled 2022-09-25: qty 60, 30d supply, fill #1

## 2022-08-11 ENCOUNTER — Other Ambulatory Visit (HOSPITAL_COMMUNITY): Payer: Self-pay

## 2022-08-11 ENCOUNTER — Other Ambulatory Visit (INDEPENDENT_AMBULATORY_CARE_PROVIDER_SITE_OTHER): Payer: Self-pay | Admitting: Primary Care

## 2022-08-11 DIAGNOSIS — Z760 Encounter for issue of repeat prescription: Secondary | ICD-10-CM

## 2022-08-11 DIAGNOSIS — I639 Cerebral infarction, unspecified: Secondary | ICD-10-CM

## 2022-08-11 DIAGNOSIS — I10 Essential (primary) hypertension: Secondary | ICD-10-CM

## 2022-08-11 NOTE — Telephone Encounter (Signed)
Requested medications are due for refill today.  yes  Requested medications are on the active medications list.  yes  Last refill. 02/09/2022 #90 1 rf for both  Future visit scheduled.   yes  Notes to clinic.  Labs are expired.    Requested Prescriptions  Pending Prescriptions Disp Refills   losartan (COZAAR) 25 MG tablet 90 tablet 1    Sig: Take 1 tablet (25 mg total) by mouth daily.     Cardiovascular:  Angiotensin Receptor Blockers Failed - 08/11/2022 11:22 AM      Failed - Cr in normal range and within 180 days    Creatinine, Ser  Date Value Ref Range Status  04/01/2021 0.63 0.57 - 1.00 mg/dL Final         Failed - K in normal range and within 180 days    Potassium  Date Value Ref Range Status  04/01/2021 4.3 3.5 - 5.2 mmol/L Final         Failed - Valid encounter within last 6 months    Recent Outpatient Visits           6 months ago Benign essential hypertension   [place] [facility] Kerin Perna, NP   1 year ago Need for prophylactic vaccination against Streptococcus pneumoniae (pneumococcus)   [place] [facility] Kerin Perna, NP   1 year ago Benign essential hypertension   [place] [facility] Kerin Perna, NP   1 year ago Genital herpes simplex, unspecified site   [place], [place], NP   1 year ago Need for Tdap vaccination   [place], [place], NP       Future Appointments             In 6 days Kerin Perna, NP [place] - Patient is not pregnant      Passed - Last BP in normal range    BP Readings from Last 1 Encounters:  05/25/22 117/70          atorvastatin (LIPITOR) 80 MG tablet 90 tablet 1    Sig: Take 1 tablet (80 mg total) by mouth daily at 6 PM.     Cardiovascular:  Antilipid - Statins Failed - 08/11/2022 11:22 AM       Failed - Lipid Panel in normal range within the last 12 months    Cholesterol, Total  Date Value Ref Range Status  04/01/2021 108 100 - 199 mg/dL Final   LDL Chol Calc (NIH)  Date Value Ref Range Status  04/01/2021 46 0 - 99 mg/dL Final   HDL  Date Value Ref Range Status  04/01/2021 42 >39 mg/dL Final   Triglycerides  Date Value Ref Range Status  04/01/2021 110 0 - 149 mg/dL Final         Passed - Patient is not pregnant      Passed - Valid encounter within last 12 months    Recent Outpatient Visits           6 months ago Benign essential hypertension   [place] [facility] Kerin Perna, NP   1 year ago Need for prophylactic vaccination against Streptococcus pneumoniae (pneumococcus)   [place] [facility] Kerin Perna, NP   1 year ago Benign essential hypertension   [place] [facility] Kerin Perna, NP   1 year ago Genital herpes simplex, unspecified site   [place], [place], NP   1 year ago Need for Tdap vaccination   [place] [facility] Kerin Perna, NP       Future Appointments             In 6 days Oletta Lamas, [place], NP [place]

## 2022-08-12 ENCOUNTER — Other Ambulatory Visit (INDEPENDENT_AMBULATORY_CARE_PROVIDER_SITE_OTHER): Payer: Self-pay

## 2022-08-12 ENCOUNTER — Other Ambulatory Visit: Payer: Self-pay

## 2022-08-12 DIAGNOSIS — Z760 Encounter for issue of repeat prescription: Secondary | ICD-10-CM

## 2022-08-12 DIAGNOSIS — I639 Cerebral infarction, unspecified: Secondary | ICD-10-CM

## 2022-08-12 DIAGNOSIS — I10 Essential (primary) hypertension: Secondary | ICD-10-CM

## 2022-08-12 MED ORDER — ATORVASTATIN CALCIUM 80 MG PO TABS
80.0000 mg | ORAL_TABLET | Freq: Every day | ORAL | 0 refills | Status: DC
  Filled 2022-08-12: qty 30, 30d supply, fill #0

## 2022-08-12 MED ORDER — LOSARTAN POTASSIUM 25 MG PO TABS
25.0000 mg | ORAL_TABLET | Freq: Every day | ORAL | 0 refills | Status: DC
  Filled 2022-08-12: qty 30, 30d supply, fill #0

## 2022-08-17 ENCOUNTER — Ambulatory Visit (INDEPENDENT_AMBULATORY_CARE_PROVIDER_SITE_OTHER): Payer: Managed Care, Other (non HMO) | Admitting: Primary Care

## 2022-08-31 ENCOUNTER — Ambulatory Visit (INDEPENDENT_AMBULATORY_CARE_PROVIDER_SITE_OTHER): Payer: Managed Care, Other (non HMO) | Admitting: Primary Care

## 2022-09-01 ENCOUNTER — Other Ambulatory Visit (INDEPENDENT_AMBULATORY_CARE_PROVIDER_SITE_OTHER): Payer: Self-pay | Admitting: Primary Care

## 2022-09-01 DIAGNOSIS — Z760 Encounter for issue of repeat prescription: Secondary | ICD-10-CM

## 2022-09-01 DIAGNOSIS — F331 Major depressive disorder, recurrent, moderate: Secondary | ICD-10-CM

## 2022-09-02 ENCOUNTER — Other Ambulatory Visit (HOSPITAL_COMMUNITY): Payer: Self-pay

## 2022-09-02 ENCOUNTER — Other Ambulatory Visit: Payer: Self-pay

## 2022-09-02 MED ORDER — ESCITALOPRAM OXALATE 10 MG PO TABS
10.0000 mg | ORAL_TABLET | Freq: Every day | ORAL | 1 refills | Status: DC
  Filled 2022-09-02: qty 90, 90d supply, fill #0
  Filled 2022-11-20: qty 90, 90d supply, fill #1

## 2022-09-07 ENCOUNTER — Ambulatory Visit: Payer: Managed Care, Other (non HMO) | Admitting: Adult Health

## 2022-09-20 ENCOUNTER — Other Ambulatory Visit (INDEPENDENT_AMBULATORY_CARE_PROVIDER_SITE_OTHER): Payer: Self-pay | Admitting: Primary Care

## 2022-09-20 DIAGNOSIS — Z760 Encounter for issue of repeat prescription: Secondary | ICD-10-CM

## 2022-09-20 DIAGNOSIS — I639 Cerebral infarction, unspecified: Secondary | ICD-10-CM

## 2022-09-20 DIAGNOSIS — I10 Essential (primary) hypertension: Secondary | ICD-10-CM

## 2022-09-20 NOTE — Progress Notes (Deleted)
[name] [address] [place]. [place] [address] [phone]       STROKE FOLLOW UP NOTE  [name] Date of Birth:  [birth year] Medical Record Number:  [id number]   Reason for Referral: stroke follow up    SUBJECTIVE:   CHIEF COMPLAINT:  No chief complaint on file.    HPI:   Update 09/21/2022 JM: Patient returns for 42-month follow-up.  Stable from stroke standpoint, no new stroke/TIA symptoms.  Right-sided dysesthesias stable.  Was started on gabapentin at prior visit in addition to topiramate ***.  Compliant on Plavix and atorvastatin.  Blood pressure well-controlled.  Has not yet further pursued sleep study or further cardiac monitoring.      History provided for reference purposes only Update 05/25/2022 JM: Patient returns for 51-month stroke follow-up.  Overall stable without new stroke/TIA symptoms.  Reports residual right-sided dysesthesias, stable.  Remains on topiramate 50 mg twice daily for poststroke pain and chronic neuropathy.  She continues to experience nerve pain and numbness especially in the morning, tolerable but can interfere with daily activity.  Denies any worsening since prior visit.  She has also been having a flareup of sciatic pain, believes she possibly twisted wrong when getting up Christmas decorations.  She was evaluated by our sleep specialist Dr. Rexene Simpson back in June but has not yet proceeded with sleep study due to financial reasons.   Completed cardiac monitor which was negative for A-fib, referral placed to EP for ILR evaluation but was unable to be contacted to schedule. She plans on reaching out to her insurance to check on price and proceed if able at that time  Remains on Plavix and atorvastatin Blood pressure well controlled   Update 11/17/2021 JM: Patient returns for stroke follow-up after prior visit 6 months ago.  She has been stable from stroke standpoint without new stroke/TIA symptoms.  Reports residual  right sided dysthesia's which have been stable. Will also have occasional bilateral hand and feet neuropathy which is chronic and can fluctuate but does believe it has slightly worsened over the past few months.  She continues to work at [employer] where she walks majority of the day. Neuropathy does not specifically worsen with any activity or movement. Neuropathy is not debilitating.  She has remained on topiramate 25 mg twice daily, denies side effects.  She questions possibly increasing dosage.  Does have chronic issues with sleeping, uses OTC sleeping aides. She has had prior sleep study likely more than 10 years ago (unable to view via epic), does not believe it showed sleep apnea but did show restless leg syndrome.  She will have occasional difficulty with RLS at night and what sounds like PLMD.  Does have day time fatigue, snores, insomnia, and nightly nocturia.   Was previously on Plavix but had difficulty obtaining refill therefore she has since been on aspirin 81 mg daily, denies side effects.  Compliant on atorvastatin, denies side effects.  Blood pressure today 119/77.    No further concerns at this time.  Update 05/20/2021 JM: Returns for stroke follow-up after prior visit with Dr. Leonie Simpson approx 5 mo ago for strokelike episode s/p tPA on 11/28/2020 and prior stroke 09/15/2020.  She has been stable from stroke standpoint without new stroke/TIA symptoms.  At prior visit, was switched from gabapentin to topiramate for poststroke paresthesias with significant improvement of tingling. Occasional "twinge" but doesn't last long. She had not had any additional severe headaches. Occasional mild headaches. EEG completed on 01/25/2021  to rule out seizures with remote history which was unremarkable. Reports continued poor vision chronically but worsened post stroke -previously discussed evaluation by ophthalmology which she now wishes to pursue as she is insured.  She is working at [employer]  in [place].  Remains on Plavix and atorvastatin -denies side effects.  Blood pressure today 122/84 on losartan 25 mg daily.  No further concerns at this time  Update 12/14/2020 Dr. Leonie Simpson: She is seen today for follow-up after recent hospital admission from 11/28/2020 for strokelike symptoms.  She presented with sudden onset of right-sided weakness and numbness.  CT head was unremarkable CT angiogram of the head and neck did not show significant large vessel stenosis.  She received IV tPA and did well.  An MRI scan of the brain was obtained which was negative for acute stroke.  LDL cholesterol was 39 mg percent hemoglobin A1c was 5.7.  Echocardiogram so not repeated as she had one in April 2022 TEE which was normal.  She had an EEG done this admission which was negative for seizure activity.  Patient physical exam was consistent with functional components including lack of activation of the right cheek when asked to smile but she was able to puff her cheeks symmetrically or blow air with these and activated facial muscles when talking.  She was felt to have strokelike episode secondary to conversion reaction versus atypical migraine.  Patient states she is done well since discharge.  She has increase her gabapentin to 300 mg in the morning, afternoon and 600 at night but see still not happy and is having right-sided facial paresthesias.  Headaches seem under better control.  She is on aspirin and Plavix tolerating them well with minor bruising and no bleeding.  Blood pressures well controlled.  She is tolerating Lipitor well without muscle aches and pains.   Initial visit 10/22/2020 JM: [name] is being seen for hospital follow-up accompanied by her friend, Sarah Simpson.  Reports residual blurred vision with reading and writing. Reports some visual impairment prior to her stroke having to use reading glasses but has had greater difficulty with reading even with using her reading glasses since her stroke.  She has  returned back to almost all prior activities.  She has not worked since the onset of [place].  She is currently uninsured and in the process of applying for [employer] assistance program.  She also reports almost daily right sided headaches which have been present since her stroke.  She does have history of migraines but has not experienced any migraines since 01/2020 - these headaches are not consistent with her known migraines.  Reports continued constant bilateral hand and feet numbness/tingling.  Has been experiencing the symptoms for years but typically come and go but since her stroke they have been constant.  She will occasionally experience painful sensation.  She was prescribed gabapentin 100 mg 3 times daily at hospital discharge but she has been tolerating but does not believe it has been beneficial  She has remained on DAPT despite 3-week recommendation but denies bleeding or bruising Remains on atorvastatin 80 mg daily without myalgias Blood pressure today 125/83  No further concerns at this time  Stroke admission 09/15/2020 56 year old female with a history of hypertension, seizure like episodes in her 33s, heart murmur, migraine headaches,, tobacco use, obesity, chronic parasthesias bilat hands, depression, RLS, ADD who presented on 09/15/2020 with facial droop and speech difficulties that started 3/28.  Personally reviewed hospitalization pertinent progress notes, lab work  and imaging with summary provided.  Evaluated by Dr. Leonie Simpson with stroke work-up revealing acute subcortical infarct in the region of the caudate tail on the left due to large cortical infarct likely of cryptogenic etiology.  Recommended DAPT for 3 weeks and aspirin alone.  TEE negative for PFO or cardiac source of embolism identified.  Hypercoagulable labs negative. Loop recorder not placed prior to d/c. Hormonal therapy for postmenopausal hot flashes discontinued.  LDL 133 -recommend initiating atorvastatin 80 mg daily.   Complaints of bilateral paresthesias/headaches/rashes unclear etiology with brain and C-spine imaging no acute findings to explain her symptoms.  Vasculitis labs negative.  No prior stroke history.  Acute subcortical infarct in the region of the caudate tail on the left due to large subcortical infarct likely of cryptogenic etiology   CT Code Stroke: A small age-indeterminate lacunar infarct is questioned within the left pons. There is no acute intracranial hemorrhage. MRI brain Diffusion abnormality in the caudate tail on the left most consistent with stroke. MRA head: Truncated examination. Small acute infarct of the left caudate tail. Normal intracranial MRA. Carotid Doppler Nearly normal bilaterally, no high grade stenosis 2D Echo EF 60-65%, No thrombus, no wall motion abnormality or shunt found.  Bilat LE Doppler no evidence of DVT Hypercoagulable labs negative TEE EF 60 to 65%.  Negative for PFO.  No evidence of cardiac source of embolism identified LDL 133 HgbA1c 5.7 No antiplatelet or anticoagulant medications prior to admission Recommend DAPT with ASA 81mg  and Plavix 75mg  x 3 weeks then ASA 81mg  monotherapy Therapy recommendations:  none Disposition:   Home on 09/18/2020       ROS:   14 system review of systems performed and negative with exception of those listed in HPI  PMH:  Past Medical History:  Diagnosis Date   ADD (attention deficit disorder)    Allergy    Anemia    Arthritis    OA back    Cyst of right ovary    Depression    Heart murmur    History of kidney stones    Hypertension    Neuromuscular disorder ([place])    RLS   Restless leg syndrome    Seizures ([place])    in her 20's- none since-? etiology     PSH:  Past Surgical History:  Procedure Laterality Date   APPENDECTOMY  2008   BUBBLE STUDY  09/18/2020   Procedure: BUBBLE STUDY;  Surgeon: Fay Records, MD;  Location: [facility] ENDOSCOPY;  Service: Cardiovascular;;   BUNIONECTOMY  2009   x 2   TEE  WITHOUT CARDIOVERSION N/A 09/18/2020   Procedure: TRANSESOPHAGEAL ECHOCARDIOGRAM (TEE);  Surgeon: Fay Records, MD;  Location: [facility] ENDOSCOPY;  Service: Cardiovascular;  Laterality: N/A;   WISDOM TOOTH EXTRACTION      Social History:  Social History   Socioeconomic History   Marital status: Single    Spouse name: Not on file   Number of children: Not on file   Years of education: Not on file   Highest education level: Not on file  Occupational History   Not on file  Tobacco Use   Smoking status: Former   Smokeless tobacco: Never   Tobacco comments:    on and off in the past, quit in 2002  Vaping Use   Vaping Use: Never used  Substance and Sexual Activity   Alcohol use: Yes    Comment: occ   Drug use: Never   Sexual activity: Not on file  Other Topics  Concern   Not on file  Social History Narrative   Work or School: Biochemist, clinical - [employer]      Home Situation: none      Spiritual Beliefs: Christian      Lifestyle: no regular exercise; so so            Social Determinants of Radio broadcast assistant Strain: Not on file  Food Insecurity: Not on file  Transportation Needs: Not on file  Physical Activity: Not on file  Stress: Not on file  Social Connections: Not on file  Intimate Partner Violence: Not on file    Family History:  Family History  Problem Relation Age of Onset   Colon cancer Mother 45   Heart attack Mother    Dementia Mother    Colon cancer Father 9   Colon polyps Neg Hx    Esophageal cancer Neg Hx    Stomach cancer Neg Hx    Rectal cancer Neg Hx    Sleep apnea Neg Hx     Medications:   Current Outpatient Medications on File Prior to Visit  Medication Sig Dispense Refill   acetaminophen (TYLENOL) 325 MG tablet Take 650 mg by mouth as needed for moderate pain or headache.     atorvastatin (LIPITOR) 80 MG tablet Take 1 tablet (80 mg total) by mouth daily at 6 PM. 30 tablet 0   clopidogrel (PLAVIX) 75 MG tablet Take 1 tablet by mouth daily.  90 tablet 3   diphenhydrAMINE (BENADRYL) 25 MG tablet Take 12.5 mg by mouth at bedtime.     escitalopram (LEXAPRO) 10 MG tablet Take 1 tablet (10 mg total) by mouth daily. 90 tablet 1   gabapentin (NEURONTIN) 300 MG capsule Take 1 capsule (300 mg total) by mouth 2 (two) times daily. 60 capsule 2   losartan (COZAAR) 25 MG tablet Take 1 tablet (25 mg total) by mouth daily. 30 tablet 0   Melatonin 10 MG TABS Take 10 mg by mouth at bedtime.     Multiple Vitamin (MULTIVITAMIN) tablet Take 1 tablet by mouth daily.     Pseudoeph-Doxylamine-DM-APAP (NYQUIL PO) Take by mouth.     topiramate (TOPAMAX) 50 MG tablet Take 1 tablet by mouth 2 times daily. 180 tablet 3   valACYclovir (VALTREX) 500 MG tablet Take 1/2 tablet by mouth daily. 90 tablet 3   No current facility-administered medications on file prior to visit.    Allergies:   Allergies  Allergen Reactions   Ciprofloxacin Swelling   Levaquin [Levofloxacin] Anaphylaxis    Hives and throat swelling    Amoxil [Amoxicillin] Rash    Extreme itching limbs and trunk    Dynacin [Minocycline] Rash   Sulfamethazine Rash      OBJECTIVE:  Physical Exam  There were no vitals filed for this visit.  There is no height or weight on file to calculate BMI. No results found.   General: well developed, well nourished, very pleasant middle-age Caucasian female, seated, in no evident distress Head: head normocephalic and atraumatic.   Neck: supple with no carotid or supraclavicular bruits Cardiovascular: regular rate and rhythm, no murmurs Musculoskeletal: No abnormality Skin:  no rash/petichiae Vascular:  Normal pulses all extremities   Neurologic Exam Mental Status: Awake and fully alert.   Fluent speech and language.  Oriented to place and time. Recent and remote memory intact. Attention span, concentration and fund of knowledge appropriate. Mood and affect appropriate.  Cranial Nerves: Pupils equal, briskly reactive to light. Extraocular  movements full without nystagmus. Visual fields full to confrontation. Hearing intact. Facial sensation intact. Face, tongue, palate moves normally and symmetrically.  Motor: Normal bulk and tone. Normal strength in all tested extremity muscles Sensory.: intact to touch , pinprick , position and vibratory sensation except subjective right-sided numbness and intermittent bilateral hand and foot numbness Coordination: Rapid alternating movements normal in all extremities. Finger-to-nose and heel-to-shin performed accurately bilaterally. Gait and Station: Arises from chair without difficulty. Stance is normal. Gait demonstrates normal stride length and balance without use of AD. Tandem walk and heel toe without difficulty Reflexes: 1+ and symmetric. Toes downgoing.        ASSESSMENT: Sarah Simpson is a 57 y.o. 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 Vascular risk factors include HTN, HLD, remote seizure history, migraine headaches, former tobacco use and obesity.     PLAN:  Cryptogenic stroke: Strokelike episode: Residual deficit: post stroke dysesthesias  Cardiac monitor negative for A-fib. Referral previously placed to EP for ILR evaluation, she will call office when able to proceed with evaluation, new referral will need to be made Continue clopidogrel 75 mg daily and atorvastatin 80 mg daily for secondary stroke prevention Discussed secondary stroke prevention measures and importance of close PCP follow up for aggressive stroke risk factor management including BP goal<130/90 and HLD with LDL goal<70  Bilateral hand and feet paresthesias: Chronic issue of unknown etiology reporting worsening post stroke.  Due to persistent symptoms, recommend initiating gabapentin 300 mg twice daily and continue on topiramate 50 mg twice daily for now but can consider discontinuing once at stable  dose of gabapentin. She would like to hold off on EMG/NCV at this present time.  At risk for sleep apnea: Evaluated by Dr. Rexene Simpson 11/2021 for possible underlying sleep apnea but has not yet pursued sleep study due to financial reasons. She will call office once interested in pursuing   Remote seizure history: EEG 01/25/2021 unremarkable. Not currently on ASMs.  No indication at this time.  Continue to monitor.    Follow up in 3 months or call earlier if needed    CC:  PCP: Kerin Perna, NP    I spent 36 minutes of face-to-face and non-face-to-face time with patient.  This included previsit chart review, lab review, study review, order entry, electronic health record documentation, patient education and discussion regarding above diagnoses and treatment plan and answered all the questions to patient's satisfaction   [name], [facility]  [facility] [address] [place], [place] [address]  Phone [phone] Fax [phone] Note: This document was prepared with digital dictation and possible smart phrase technology. Any transcriptional errors that result from this process are unintentional.

## 2022-09-21 ENCOUNTER — Other Ambulatory Visit (HOSPITAL_COMMUNITY): Payer: Self-pay

## 2022-09-21 ENCOUNTER — Ambulatory Visit: Payer: Managed Care, Other (non HMO) | Admitting: Adult Health

## 2022-09-21 ENCOUNTER — Encounter: Payer: Self-pay | Admitting: Adult Health

## 2022-09-21 MED ORDER — LOSARTAN POTASSIUM 25 MG PO TABS
25.0000 mg | ORAL_TABLET | Freq: Every day | ORAL | 0 refills | Status: DC
  Filled 2022-09-21 – 2022-09-22 (×2): qty 30, 30d supply, fill #0

## 2022-09-21 MED ORDER — ATORVASTATIN CALCIUM 80 MG PO TABS
80.0000 mg | ORAL_TABLET | Freq: Every day | ORAL | 0 refills | Status: DC
  Filled 2022-09-21 – 2022-09-22 (×2): qty 30, 30d supply, fill #0

## 2022-09-22 ENCOUNTER — Other Ambulatory Visit: Payer: Self-pay

## 2022-09-25 ENCOUNTER — Other Ambulatory Visit (HOSPITAL_COMMUNITY): Payer: Self-pay

## 2022-09-26 ENCOUNTER — Other Ambulatory Visit (HOSPITAL_COMMUNITY): Payer: Self-pay

## 2022-09-26 ENCOUNTER — Other Ambulatory Visit: Payer: Self-pay

## 2022-10-10 NOTE — Telephone Encounter (Signed)
Shanda Bumps placed a referral for patient back in 2022 to [facility] eye care. Patient didn't compele at that time. During last visit, Shanda Bumps recommend pt see Opthalmology but didn't place new referral. In Jessica's absence, are you willing to refer patient to [facility] eye care?

## 2022-10-13 ENCOUNTER — Telehealth: Payer: Managed Care, Other (non HMO) | Admitting: Physician Assistant

## 2022-10-13 ENCOUNTER — Other Ambulatory Visit (HOSPITAL_COMMUNITY): Payer: Self-pay

## 2022-10-13 DIAGNOSIS — J019 Acute sinusitis, unspecified: Secondary | ICD-10-CM | POA: Diagnosis not present

## 2022-10-13 DIAGNOSIS — B9789 Other viral agents as the cause of diseases classified elsewhere: Secondary | ICD-10-CM | POA: Diagnosis not present

## 2022-10-13 MED ORDER — PREDNISONE 10 MG (21) PO TBPK
ORAL_TABLET | ORAL | 0 refills | Status: DC
  Filled 2022-10-13: qty 21, 6d supply, fill #0

## 2022-10-13 MED ORDER — IPRATROPIUM BROMIDE 0.03 % NA SOLN
2.0000 | Freq: Two times a day (BID) | NASAL | 0 refills | Status: DC
  Filled 2022-10-13: qty 30, 43d supply, fill #0

## 2022-10-13 NOTE — Progress Notes (Signed)
E-Visit for Sinus Problems  We are sorry that you are not feeling well.  Here is how we plan to help!  Based on what you have shared with me it looks like you have sinusitis.  Sinusitis is inflammation and infection in the sinus cavities of the head.  Based on your presentation I believe you most likely have Acute Viral Sinusitis.This is an infection most likely caused by a virus. There is not specific treatment for viral sinusitis other than to help you with the symptoms until the infection runs its course.  You may use an oral decongestant such as Mucinex D or if you have glaucoma or high blood pressure use plain Mucinex. Saline nasal spray help and can safely be used as often as needed for congestion, I have prescribed: Ipratropium Bromide nasal spray 0.03% 2 sprays in eah nostril 2-3 times a day. I have also sent in a steroid pack to take as directed.  Some authorities believe that zinc sprays or the use of Echinacea may shorten the course of your symptoms.  Sinus infections are not as easily transmitted as other respiratory infection, however we still recommend that you avoid close contact with loved ones, especially the very young and elderly.  Remember to wash your hands thoroughly throughout the day as this is the number one way to prevent the spread of infection!  Home Care: Only take medications as instructed by your medical team. Do not take these medications with alcohol. A steam or ultrasonic humidifier can help congestion.  You can place a towel over your head and breathe in the steam from hot water coming from a faucet. Avoid close contacts especially the very young and the elderly. Cover your mouth when you cough or sneeze. Always remember to wash your hands.  Get Help Right Away If: You develop worsening fever or sinus pain. You develop a severe head ache or visual changes. Your symptoms persist after you have completed your treatment plan.  Make sure you Understand these  instructions. Will watch your condition. Will get help right away if you are not doing well or get worse.   Thank you for choosing an e-visit.  Your e-visit answers were reviewed by a board certified advanced clinical practitioner to complete your personal care plan. Depending upon the condition, your plan could have included both over the counter or prescription medications.  Please review your pharmacy choice. Make sure the pharmacy is open so you can pick up prescription now. If there is a problem, you may contact your provider through [employer] and have the prescription routed to another pharmacy.  Your safety is important to [place]. If you have drug allergies check your prescription carefully.   For the next 24 hours you can use MyChart to ask questions about today's visit, request a non-urgent call back, or ask for a work or school excuse. You will get an email in the next two days asking about your experience. I hope that your e-visit has been valuable and will speed your recovery.

## 2022-10-13 NOTE — Progress Notes (Signed)
I have spent 5 minutes in review of e-visit questionnaire, review and updating patient chart, medical decision making and response to patient.   Brenleigh Collet Cody Zymere Patlan, PA-C    

## 2022-10-14 ENCOUNTER — Other Ambulatory Visit: Payer: Self-pay | Admitting: Neurology

## 2022-10-14 DIAGNOSIS — H539 Unspecified visual disturbance: Secondary | ICD-10-CM

## 2022-10-17 ENCOUNTER — Telehealth: Payer: Self-pay | Admitting: Neurology

## 2022-10-17 ENCOUNTER — Telehealth: Payer: Self-pay | Admitting: Adult Health

## 2022-10-17 NOTE — Telephone Encounter (Signed)
Ophthalmology referral faxed to [facility] (fax# [phone], phone# [phone]) 

## 2022-10-17 NOTE — Telephone Encounter (Signed)
Patient sent Sarah Simpson a message a few weeks ago. She is in need of a referral to see Dr. Lavona Mound for an exam. This was suggested but they can't get her in until October unless she has a referral from our office. Patient is out of contacts and can't see. Pt can be contacted by phone or if the referral can be sent through mychart she will be ok with that.

## 2022-10-18 NOTE — Telephone Encounter (Addendum)
Per TE yesterday, Sarah Simpson sent referral prior to patient calling. I had also sent a MC message to patient yesterday morning informing her Dr Pearlean Brownie placed the referral in JM absence.

## 2022-10-23 ENCOUNTER — Other Ambulatory Visit (HOSPITAL_COMMUNITY): Payer: Self-pay

## 2022-10-24 ENCOUNTER — Other Ambulatory Visit (HOSPITAL_COMMUNITY): Payer: Self-pay

## 2022-10-25 ENCOUNTER — Other Ambulatory Visit: Payer: Self-pay

## 2022-10-25 ENCOUNTER — Other Ambulatory Visit (HOSPITAL_COMMUNITY): Payer: Self-pay

## 2022-10-25 MED ORDER — ATORVASTATIN CALCIUM 80 MG PO TABS
80.0000 mg | ORAL_TABLET | Freq: Every day | ORAL | 0 refills | Status: DC
  Filled 2022-10-25: qty 30, 30d supply, fill #0

## 2022-10-25 MED ORDER — LOSARTAN POTASSIUM 25 MG PO TABS
25.0000 mg | ORAL_TABLET | Freq: Every day | ORAL | 0 refills | Status: DC
  Filled 2022-10-25: qty 30, 30d supply, fill #0

## 2022-10-26 ENCOUNTER — Ambulatory Visit: Payer: Managed Care, Other (non HMO) | Admitting: Adult Health

## 2022-10-26 ENCOUNTER — Encounter: Payer: Self-pay | Admitting: Adult Health

## 2022-10-26 ENCOUNTER — Other Ambulatory Visit (HOSPITAL_COMMUNITY): Payer: Self-pay

## 2022-10-26 VITALS — BP 100/67 | HR 71 | Ht 65.5 in | Wt 219.4 lb

## 2022-10-26 DIAGNOSIS — I639 Cerebral infarction, unspecified: Secondary | ICD-10-CM

## 2022-10-26 DIAGNOSIS — G629 Polyneuropathy, unspecified: Secondary | ICD-10-CM | POA: Diagnosis not present

## 2022-10-26 MED ORDER — TOPIRAMATE 50 MG PO TABS
75.0000 mg | ORAL_TABLET | Freq: Two times a day (BID) | ORAL | 3 refills | Status: DC
Start: 2022-10-26 — End: 2023-05-03
  Filled 2022-10-26 – 2022-12-04 (×2): qty 270, 90d supply, fill #0

## 2022-10-26 NOTE — Progress Notes (Signed)
PATIENT: Sarah Simpson DOB: 1965/07/22  REASON FOR VISIT: follow up HISTORY FROM: patient PRIMARY NEUROLOGIST: Dr. Demetrios Loll  Chief Complaint  Patient presents with   Follow-up    Rm 8, alone.  Doing ok, no concerns.   3 trips and falls (did not mention last time).       HISTORY OF PRESENT ILLNESS: Today 10/26/22  Sarah Simpson is a 57 y.o. female who has been followed in this office for Stroke and neuropathy. Returns today for follow-up.  Patient denies any strokelike symptoms.  She remains on Plavix.  Follows closely with her primary care for stroke risk factor management.  Reports that she has had 3 falls due to tripping.  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  Often times she is looking backwards or talking to the clients. continues to have dyskinesias on the right side post stroke.  She remains  Topamax 50 mg twice a day.  Couldn't tolerate gabapentin due to drowsiness  HISTORY Update 05/25/2022 JM: Patient returns for 87-month stroke follow-up.  Overall stable without new stroke/TIA symptoms.  Reports residual right-sided dysesthesias, stable.  Remains on topiramate 50 mg twice daily for poststroke pain and chronic neuropathy.  She continues to experience nerve pain and numbness especially in the morning, tolerable but can interfere with daily activity.  Denies any worsening since prior visit.  She has also been having a flareup of sciatic pain, believes she possibly twisted wrong when getting up Christmas decorations.   She was evaluated by our sleep specialist Dr. Frances Furbish back in June but has not yet proceeded with sleep study due to financial reasons.    Completed cardiac monitor which was negative for A-fib, referral placed to EP for ILR evaluation but was unable to be contacted to schedule. She plans on reaching out to her insurance to check on price and proceed if able at that time   Remains on Plavix and  atorvastatin Blood pressure well controlled    REVIEW OF SYSTEMS: Out of a complete 14 system review of symptoms, the patient complains only of the following symptoms, and all other reviewed systems are negative.  ALLERGIES: Allergies  Allergen Reactions   Ciprofloxacin Swelling   Levaquin [Levofloxacin] Anaphylaxis    Hives and throat swelling    Amoxil [Amoxicillin] Rash    Extreme itching limbs and trunk    Dynacin [Minocycline] Rash   Sulfamethazine Rash    HOME MEDICATIONS: Outpatient Medications Prior to Visit  Medication Sig Dispense Refill   acetaminophen (TYLENOL) 325 MG tablet Take 650 mg by mouth as needed for moderate pain or headache.     atorvastatin (LIPITOR) 80 MG tablet Take 1 tablet (80 mg total) by mouth daily 6 pm 30 tablet 0   clopidogrel (PLAVIX) 75 MG tablet Take 1 tablet by mouth daily. 90 tablet 3   escitalopram (LEXAPRO) 10 MG tablet Take 1 tablet (10 mg total) by mouth daily. 90 tablet 1   gabapentin (NEURONTIN) 300 MG capsule Take 1 capsule (300 mg total) by mouth 2 (two) times daily. 60 capsule 2   ipratropium (ATROVENT) 0.03 % nasal spray Place 2 sprays into both nostrils every 12 (twelve) hours. 30 mL 0   losartan (COZAAR) 25 MG tablet Take 1 tablet (25 mg total) by mouth daily. 30 tablet 0   Melatonin 10 MG TABS Take 10 mg by mouth at bedtime.     Multiple Vitamin (MULTIVITAMIN) tablet Take 1  tablet by mouth daily.     Pseudoeph-Doxylamine-DM-APAP (NYQUIL PO) Take by mouth.     topiramate (TOPAMAX) 50 MG tablet Take 1 tablet by mouth 2 times daily. 180 tablet 3   valACYclovir (VALTREX) 500 MG tablet Take 1/2 tablet by mouth daily. 90 tablet 3   diphenhydrAMINE (BENADRYL) 25 MG tablet Take 12.5 mg by mouth at bedtime.     predniSONE (STERAPRED UNI-PAK 21 TAB) 10 MG (21) TBPK tablet Take following package directions 21 tablet 0   No facility-administered medications prior to visit.    PAST MEDICAL HISTORY: Past Medical History:  Diagnosis Date    ADD (attention deficit disorder)    Allergy    Anemia    Arthritis    OA back    Cyst of right ovary    Depression    Heart murmur    History of kidney stones    Hypertension    Neuromuscular disorder (HCC)    RLS   Restless leg syndrome    Seizures (HCC)    in her 20's- none since-? etiology     PAST SURGICAL HISTORY: Past Surgical History:  Procedure Laterality Date   APPENDECTOMY  2008   BUBBLE STUDY  09/18/2020   Procedure: BUBBLE STUDY;  Surgeon: Pricilla Riffle, MD;  Location: [facility] ENDOSCOPY;  Service: Cardiovascular;;   BUNIONECTOMY  2009   x 2   TEE WITHOUT CARDIOVERSION N/A 09/18/2020   Procedure: TRANSESOPHAGEAL ECHOCARDIOGRAM (TEE);  Surgeon: Pricilla Riffle, MD;  Location[facility] ENDOSCOPY;  Service: Cardiovascular;  Laterality: N/A;   WISDOM TOOTH EXTRACTION      FAMILY HISTORY: Family History  Problem Relation Age of Onset   Colon cancer Mother 60   Heart attack Mother    Dementia Mother    Colon cancer Father 55   Colon polyps Neg Hx    Esophageal cancer Neg Hx    Stomach cancer Neg Hx    Rectal cancer Neg Hx    Sleep apnea Neg Hx     SOCIAL HISTORY: Social History   Socioeconomic History   Marital status: Single    Spouse name: Not on file   Number of children: Not on file   Years of education: Not on file   Highest education level: Not on file  Occupational History   Not on file  Tobacco Use   Smoking status: Former   Smokeless tobacco: Never   Tobacco comments:    on and off in the past, quit in 2002  Vaping Use   Vaping Use: Never used  Substance and Sexual Activity   Alcohol use: Yes    Comment: occ   Drug use: Never   Sexual activity: Not on file  Other Topics Concern   Not on file  Social History Narrative   Work or School: Tax adviser - [employer]      Home Situation: none      Spiritual Beliefs: Christian      Lifestyle: no regular exercise; so so            Social Determinants of Corporate investment banker Strain: Not on file   Food Insecurity: Not on file  Transportation Needs: Not on file  Physical Activity: Not on file  Stress: Not on file  Social Connections: Not on file  Intimate Partner Violence: Not on file      PHYSICAL EXAM  Vitals:   10/26/22 0841  BP: 100/67  Pulse: 71  Weight: 219 lb 6.4 oz (99.5  kg)  Height: 5' 5.5" (1.664 m)   Body mass index is 35.95 kg/m.  Generalized: Well developed, in no acute distress   Neurological examination  Mentation: Alert oriented to time, place, history taking. Follows all commands speech and language fluent Cranial nerve II-XII: Pupils were equal round reactive to light. Extraocular movements were full, visual field were full on confrontational test. Facial sensation and strength were normal. Uvula tongue midline. Head turning and shoulder shrug  were normal and symmetric. Motor: The motor testing reveals 5 over 5 strength of all 4 extremities. Good symmetric motor tone is noted throughout.  Sensory: Sensory testing is intact to soft touch on all 4 extremities. No evidence of extinction is noted.  Coordination: Cerebellar testing reveals good finger-nose-finger and heel-to-shin bilaterally.  Gait and station: Gait is normal. Tandem gait not attempted Romberg is negative. No drift is seen.  Reflexes: Deep tendon reflexes are symmetric and normal bilaterally.   DIAGNOSTIC DATA (LABS, IMAGING, TESTING) - I reviewed patient records, labs, notes, testing and imaging myself where available.  Lab Results  Component Value Date   WBC 7.2 11/28/2020   HGB 14.3 11/28/2020   HCT 42.0 11/28/2020   MCV 93.0 11/28/2020   PLT 238 11/28/2020      Component Value Date/Time   NA 141 04/01/2021 0912   K 4.3 04/01/2021 0912   CL 109 (H) 04/01/2021 0912   CO2 22 04/01/2021 0912   GLUCOSE 114 (H) 04/01/2021 0912   GLUCOSE 150 (H) 11/28/2020 2329   BUN 12 04/01/2021 0912   CREATININE 0.63 04/01/2021 0912   CALCIUM 9.2 04/01/2021 0912   PROT 6.5 04/01/2021 0912    ALBUMIN 4.4 04/01/2021 0912   AST 19 04/01/2021 0912   ALT 22 04/01/2021 0912   ALKPHOS 60 04/01/2021 0912   BILITOT 0.6 04/01/2021 0912   GFRNONAA >60 11/28/2020 2316   GFRAA >60 02/05/2017 1525   Lab Results  Component Value Date   CHOL 108 04/01/2021   HDL 42 04/01/2021   LDLCALC 46 04/01/2021   TRIG 110 04/01/2021   CHOLHDL 2.6 04/01/2021   Lab Results  Component Value Date   HGBA1C 5.9 (A) 07/01/2021   Lab Results  Component Value Date   VITAMINB12 518 11/18/2021   Lab Results  Component Value Date   TSH 1.280 11/18/2021      ASSESSMENT AND PLAN 57 y.o. year old female  has a past medical history of ADD (attention deficit disorder), Allergy, Anemia, Arthritis, Cyst of right ovary, Depression, Heart murmur, History of kidney stones, Hypertension, Neuromuscular disorder (HCC), Restless leg syndrome, and Seizures (HCC). here with:  1.  History of stroke 2.  Neuropathy/dyskinesia  Continue Plavix 75 mg daily for stroke prevention Maintain strict control of blood pressure and cholesterol Increase Topamax to 75 mg twice a day Did advise in the future we could potentially try a lower dose of gabapentin (100 mg) Discussed physical therapy however she does not feel that this would be beneficial.  Did advise that she could do balance exercises at home-such as yoga or Pilates. Follow-up in 6 months with Shanda Bumps or sooner if needed     Butch Penny, MSN, NP-C 10/26/2022, 8:51 AM [facility] [address], [address] [place], [place] [address] [phone]

## 2022-10-26 NOTE — Patient Instructions (Addendum)
Your Plan:  Continue Plavix for stroke prevention Maintain strict control of BP and Cholesterol   Increase Topamax 75 mg twice a day In the future can consider lower dose of gabapentin-- 100 mg  If your symptoms worsen or you develop new symptoms please let us know.    Thank you for coming to see [place] at [facility]. I hope we have been able to provide you high quality care today.  You may receive a patient satisfaction survey over the next few weeks. We would appreciate your feedback and comments so that we may continue to improve ourselves and the health of our patients.

## 2022-11-20 ENCOUNTER — Other Ambulatory Visit (HOSPITAL_COMMUNITY): Payer: Self-pay

## 2022-11-20 ENCOUNTER — Other Ambulatory Visit: Payer: Self-pay | Admitting: Adult Health

## 2022-11-20 DIAGNOSIS — I639 Cerebral infarction, unspecified: Secondary | ICD-10-CM

## 2022-11-21 ENCOUNTER — Other Ambulatory Visit (HOSPITAL_COMMUNITY): Payer: Self-pay

## 2022-11-21 ENCOUNTER — Other Ambulatory Visit: Payer: Self-pay

## 2022-11-21 MED ORDER — CLOPIDOGREL BISULFATE 75 MG PO TABS
75.0000 mg | ORAL_TABLET | Freq: Every day | ORAL | 3 refills | Status: DC
Start: 2022-11-21 — End: 2023-12-29
  Filled 2022-11-21 – 2022-12-01 (×2): qty 90, 90d supply, fill #0
  Filled 2023-02-19: qty 90, 90d supply, fill #1
  Filled 2023-06-04: qty 90, 90d supply, fill #2
  Filled 2023-09-03: qty 90, 90d supply, fill #3

## 2022-11-21 MED ORDER — LOSARTAN POTASSIUM 25 MG PO TABS
25.0000 mg | ORAL_TABLET | Freq: Every day | ORAL | 0 refills | Status: DC
  Filled 2022-11-21 – 2022-11-22 (×2): qty 30, 30d supply, fill #0

## 2022-11-21 MED ORDER — ATORVASTATIN CALCIUM 80 MG PO TABS
80.0000 mg | ORAL_TABLET | Freq: Every day | ORAL | 0 refills | Status: DC
  Filled 2022-11-21: qty 30, 30d supply, fill #0

## 2022-11-22 ENCOUNTER — Other Ambulatory Visit (HOSPITAL_COMMUNITY): Payer: Self-pay

## 2022-11-22 ENCOUNTER — Other Ambulatory Visit: Payer: Self-pay

## 2022-12-02 ENCOUNTER — Other Ambulatory Visit (HOSPITAL_COMMUNITY): Payer: Self-pay

## 2022-12-05 ENCOUNTER — Other Ambulatory Visit (HOSPITAL_COMMUNITY): Payer: Self-pay

## 2022-12-05 ENCOUNTER — Other Ambulatory Visit: Payer: Self-pay

## 2022-12-18 ENCOUNTER — Other Ambulatory Visit (HOSPITAL_COMMUNITY): Payer: Self-pay

## 2022-12-19 ENCOUNTER — Other Ambulatory Visit (HOSPITAL_COMMUNITY): Payer: Self-pay

## 2022-12-19 ENCOUNTER — Other Ambulatory Visit: Payer: Self-pay

## 2022-12-19 ENCOUNTER — Other Ambulatory Visit: Payer: Self-pay | Admitting: Adult Health

## 2022-12-19 MED ORDER — LOSARTAN POTASSIUM 25 MG PO TABS
25.0000 mg | ORAL_TABLET | Freq: Every day | ORAL | 0 refills | Status: DC
  Filled 2022-12-19: qty 30, 30d supply, fill #0

## 2022-12-19 MED ORDER — ATORVASTATIN CALCIUM 80 MG PO TABS
80.0000 mg | ORAL_TABLET | Freq: Every day | ORAL | 0 refills | Status: DC
  Filled 2022-12-19: qty 30, 30d supply, fill #0

## 2022-12-20 ENCOUNTER — Other Ambulatory Visit (HOSPITAL_COMMUNITY): Payer: Self-pay

## 2023-01-22 ENCOUNTER — Other Ambulatory Visit (HOSPITAL_COMMUNITY): Payer: Self-pay

## 2023-01-23 ENCOUNTER — Other Ambulatory Visit (HOSPITAL_COMMUNITY): Payer: Self-pay

## 2023-01-23 MED ORDER — LOSARTAN POTASSIUM 25 MG PO TABS
25.0000 mg | ORAL_TABLET | Freq: Every day | ORAL | 0 refills | Status: DC
  Filled 2023-01-23: qty 30, 30d supply, fill #0

## 2023-01-29 ENCOUNTER — Other Ambulatory Visit (HOSPITAL_COMMUNITY): Payer: Self-pay

## 2023-01-30 ENCOUNTER — Other Ambulatory Visit (HOSPITAL_COMMUNITY): Payer: Self-pay

## 2023-01-30 MED ORDER — ATORVASTATIN CALCIUM 80 MG PO TABS
80.0000 mg | ORAL_TABLET | Freq: Every day | ORAL | 0 refills | Status: DC
  Filled 2023-01-30: qty 30, 30d supply, fill #0

## 2023-02-19 ENCOUNTER — Other Ambulatory Visit (HOSPITAL_COMMUNITY): Payer: Self-pay

## 2023-02-20 ENCOUNTER — Other Ambulatory Visit (HOSPITAL_COMMUNITY): Payer: Self-pay

## 2023-02-21 ENCOUNTER — Other Ambulatory Visit (HOSPITAL_COMMUNITY): Payer: Self-pay

## 2023-02-21 MED ORDER — LOSARTAN POTASSIUM 25 MG PO TABS
25.0000 mg | ORAL_TABLET | Freq: Every day | ORAL | 0 refills | Status: DC
  Filled 2023-02-21: qty 30, 30d supply, fill #0

## 2023-02-21 MED ORDER — ESCITALOPRAM OXALATE 10 MG PO TABS
10.0000 mg | ORAL_TABLET | Freq: Every day | ORAL | 1 refills | Status: DC
  Filled 2023-02-21: qty 90, 90d supply, fill #0
  Filled 2023-06-04: qty 90, 90d supply, fill #1

## 2023-02-21 MED ORDER — ATORVASTATIN CALCIUM 80 MG PO TABS
80.0000 mg | ORAL_TABLET | Freq: Every day | ORAL | 0 refills | Status: DC
  Filled 2023-02-21: qty 30, 30d supply, fill #0

## 2023-04-02 ENCOUNTER — Other Ambulatory Visit (INDEPENDENT_AMBULATORY_CARE_PROVIDER_SITE_OTHER): Payer: Self-pay | Admitting: Nurse Practitioner

## 2023-04-02 ENCOUNTER — Other Ambulatory Visit (HOSPITAL_COMMUNITY): Payer: Self-pay

## 2023-04-02 DIAGNOSIS — Z760 Encounter for issue of repeat prescription: Secondary | ICD-10-CM

## 2023-04-02 DIAGNOSIS — A6000 Herpesviral infection of urogenital system, unspecified: Secondary | ICD-10-CM

## 2023-04-03 ENCOUNTER — Other Ambulatory Visit (HOSPITAL_COMMUNITY): Payer: Self-pay

## 2023-04-03 MED ORDER — ATORVASTATIN CALCIUM 80 MG PO TABS
80.0000 mg | ORAL_TABLET | Freq: Every evening | ORAL | 0 refills | Status: DC
  Filled 2023-04-03: qty 30, 30d supply, fill #0

## 2023-04-03 MED ORDER — LOSARTAN POTASSIUM 25 MG PO TABS
25.0000 mg | ORAL_TABLET | Freq: Every day | ORAL | 0 refills | Status: DC
  Filled 2023-04-03: qty 30, 30d supply, fill #0

## 2023-04-03 NOTE — Telephone Encounter (Signed)
Requested Prescriptions  Refused Prescriptions Disp Refills   valACYclovir (VALTREX) 500 MG tablet 90 tablet 3    Sig: Take 1/2 tablet by mouth daily.     Antimicrobials:  Antiviral Agents - Anti-Herpetic Failed - 04/02/2023 12:17 PM      Failed - Valid encounter within last 12 months    Recent Outpatient Visits           1 year ago Benign essential hypertension   [place] [facility] Grayce Sessions, NP   1 year ago Need for prophylactic vaccination against Streptococcus pneumoniae (pneumococcus)   [place] [facility] Grayce Sessions, NP   2 years ago Benign essential hypertension   [place] [facility] Grayce Sessions, NP   2 years ago Genital herpes simplex, unspecified site   [place] [facility] Grayce Sessions, NP   2 years ago Need for Tdap vaccination   [place] [facility] Grayce Sessions, NP

## 2023-04-22 ENCOUNTER — Encounter: Payer: Self-pay | Admitting: Adult Health

## 2023-04-22 ENCOUNTER — Emergency Department (HOSPITAL_BASED_OUTPATIENT_CLINIC_OR_DEPARTMENT_OTHER)
Admission: EM | Admit: 2023-04-22 | Discharge: 2023-04-22 | Disposition: A | Discharge status: Home / Self Care | Payer: Managed Care, Other (non HMO) | Attending: Emergency Medicine | Admitting: Emergency Medicine

## 2023-04-22 ENCOUNTER — Emergency Department (HOSPITAL_BASED_OUTPATIENT_CLINIC_OR_DEPARTMENT_OTHER): Payer: Managed Care, Other (non HMO)

## 2023-04-22 ENCOUNTER — Other Ambulatory Visit: Payer: Self-pay

## 2023-04-22 ENCOUNTER — Encounter (HOSPITAL_BASED_OUTPATIENT_CLINIC_OR_DEPARTMENT_OTHER): Payer: Self-pay | Admitting: Emergency Medicine

## 2023-04-22 DIAGNOSIS — R200 Anesthesia of skin: Secondary | ICD-10-CM | POA: Diagnosis present

## 2023-04-22 DIAGNOSIS — J069 Acute upper respiratory infection, unspecified: Secondary | ICD-10-CM | POA: Diagnosis not present

## 2023-04-22 DIAGNOSIS — Z7901 Long term (current) use of anticoagulants: Secondary | ICD-10-CM | POA: Insufficient documentation

## 2023-04-22 DIAGNOSIS — R531 Weakness: Secondary | ICD-10-CM | POA: Diagnosis not present

## 2023-04-22 LAB — CBG MONITORING, ED: Glucose-Capillary: 116 mg/dL — ABNORMAL HIGH (ref 70–99)

## 2023-04-22 MED ORDER — ALBUTEROL SULFATE HFA 108 (90 BASE) MCG/ACT IN AERS
2.0000 | INHALATION_SPRAY | Freq: Once | RESPIRATORY_TRACT | Status: AC
  Administered 2023-04-22: 2 via RESPIRATORY_TRACT
  Filled 2023-04-22: qty 6.7

## 2023-04-22 NOTE — ED Provider Notes (Signed)
[place] EMERGENCY DEPARTMENT AT [facility] Provider Note   CSN: [id number] Arrival date & time: 04/22/23  1837     History  Chief Complaint  Patient presents with   Facial numbness    Sarah Simpson is a 57 y.o. female.  Patient to ED for evaluation of a numb feeling that affects her entire face. Symptoms started this afternoon while at work. She reports developing flu-like symptoms earlier in the week with cough, generalized weakness, congestion, low grade fever. No vomiting or diarrhea. She has a history of CVA and was concerned. No facial droop, vision change, speech difficulty, weakness.   The history is provided by the patient. No language interpreter was used.       Home Medications Prior to Admission medications   Medication Sig Start Date End Date Taking? Authorizing Provider  acetaminophen (TYLENOL) 325 MG tablet Take 650 mg by mouth as needed for moderate pain or headache.    [provider]  atorvastatin (LIPITOR) 80 MG tablet Take 1 tablet (80 mg) by mouth daily at 6 PM. 04/03/23     clopidogrel (PLAVIX) 75 MG tablet Take 1 tablet by mouth daily. 11/21/22   Ihor Austin, NP  escitalopram (LEXAPRO) 10 MG tablet Take 1 tablet (10 mg total) by mouth daily. 02/21/23     ipratropium (ATROVENT) 0.03 % nasal spray Place 2 sprays into both nostrils every 12 (twelve) hours. 10/13/22   Waldon Merl, PA-C  losartan (COZAAR) 25 MG tablet Take 1 tablet (25 mg total) by mouth daily. 04/03/23     Melatonin 10 MG TABS Take 10 mg by mouth at bedtime.    [provider]  Multiple Vitamin (MULTIVITAMIN) tablet Take 1 tablet by mouth daily.    [provider]  Pseudoeph-Doxylamine-DM-APAP (NYQUIL PO) Take by mouth.    [provider]  topiramate (TOPAMAX) 50 MG tablet Take 1.5 tablets (75 mg total) by mouth 2 (two) times daily. 10/26/22   Butch Penny, NP  valACYclovir (VALTREX) 500 MG tablet Take 1/2 tablet by mouth daily. 12/19/21    Claiborne Rigg, NP      Allergies    Ciprofloxacin, Levaquin [levofloxacin], Amoxil [amoxicillin], Dynacin [minocycline], and Sulfamethazine    Review of Systems   Review of Systems  Physical Exam Updated Vital Signs BP 132/85 (BP Location: Right Arm)   Pulse 73   Temp 98 F (36.7 C)   Resp 18   Ht 5' 6.5" (1.689 m)   Wt 100.7 kg   SpO2 97%   BMI 35.29 kg/m  Physical Exam Vitals and nursing note reviewed.  Constitutional:      Appearance: Normal appearance.  HENT:     Head: Normocephalic.     Mouth/Throat:     Mouth: Mucous membranes are moist.  Neck:     Vascular: No carotid bruit.  Cardiovascular:     Rate and Rhythm: Normal rate and regular rhythm.     Heart sounds: No murmur heard. Pulmonary:     Effort: Pulmonary effort is normal.     Breath sounds: Wheezing (End expiratory wheezing) and rhonchi (diffuse, bilateral) present.  Musculoskeletal:        General: Normal range of motion.     Cervical back: Normal range of motion and neck supple.  Skin:    General: Skin is warm and dry.  Neurological:     General: No focal deficit present.     Mental Status: She is alert and oriented to person, place, and time.  GCS: GCS eye subscore is 4. GCS verbal subscore is 5. GCS motor subscore is 6.     Cranial Nerves: Cranial nerves 2-12 are intact. No facial asymmetry.     Sensory: Sensation is intact.     Motor: No weakness, abnormal muscle tone or pronator drift.     Coordination: Coordination is intact.     Deep Tendon Reflexes:     Reflex Scores:      Patellar reflexes are 2+ on the right side and 2+ on the left side.    ED Results / Procedures / Treatments   Labs (all labs ordered are listed, but only abnormal results are displayed) Labs Reviewed  CBG MONITORING, ED - Abnormal; Notable for the following components:      Result Value   Glucose-Capillary 116 (*)    All other components within normal limits    EKG None  Radiology DG Chest Port 1  View  Result Date: 04/22/2023 CLINICAL DATA:  Cough EXAM: PORTABLE CHEST 1 VIEW COMPARISON:  03/01/2021 FINDINGS: The heart size and mediastinal contours are within normal limits. Slightly low lung volumes. Increased bibasilar interstitial markings. No pleural effusion or pneumothorax. The visualized skeletal structures are unremarkable. IMPRESSION: Increased bibasilar interstitial markings, which may reflect atelectasis versus atypical/viral infection. Electronically Signed   By: Duanne Guess D.O.   On: 04/22/2023 19:58    Procedures Procedures    Medications Ordered in ED Medications  albuterol (VENTOLIN HFA) 108 (90 Base) MCG/ACT inhaler 2 puff (2 puffs Inhalation Given 04/22/23 2003)    ED Course/ Medical Decision Making/ A&P Clinical Course as of 04/22/23 2035  Sat Apr 22, 2023  1937 Patient c/o a numbness to entire face from jaw line to hair line. H/O CVA. With bilateral symptoms, no other ss/sx of stroke, doubt acute CVA. She has had a URI this week and has mild wheezing and cough. Will obtain chest xray. Will also provide 2 puffs Albuterol inhaler for symptom relief.  [SU]  2030 CXR c/w atypical vs viral infection, favor viral without fever. Albuterol provided with some relief of wheezing, less cough. Discussed with Dr. Rubin Payor. She is felt appropriate for discharge home. Return precautions discussed. Follow up neurology for which she has an already scheduled appointment.  [SU]    Clinical Course User Index [SU] Elpidio Anis, PA-C                                 Medical Decision Making Amount and/or Complexity of Data Reviewed Radiology: ordered.           Final Clinical Impression(s) / ED Diagnoses Final diagnoses:  Viral upper respiratory tract infection  Facial numbness    Rx / DC Orders ED Discharge Orders     None         Danne Harbor 04/22/23 2035    Benjiman Core, MD 04/22/23 2204

## 2023-04-22 NOTE — Discharge Instructions (Signed)
Use the Albuterol inhaler 2 puffs every 3-4 hours if this helps with cough and wheezing.   Follow up with neurology as scheduled for recheck of facial numbness.   Return to the ED with any new or concerning symptoms.

## 2023-04-22 NOTE — ED Triage Notes (Signed)
Pt Sarah Simpson c/o sudden onset of facial numbness "in entire face," generalized weakness at 1330 today. No slurred speech, aphasia, ataxia.  Denies visual changes  States she was sick last week with chills, fever, congestion but came today because she was worried because of hx CVA.  PMH neuropathy

## 2023-04-22 NOTE — Progress Notes (Signed)
RT Note: Pt. Educated on use of Albuterol Inhaler with/without Spacer device, has used before, able to use independently, all questions answered.

## 2023-05-03 ENCOUNTER — Other Ambulatory Visit: Payer: Self-pay

## 2023-05-03 ENCOUNTER — Encounter: Payer: Self-pay | Admitting: Adult Health

## 2023-05-03 ENCOUNTER — Ambulatory Visit (INDEPENDENT_AMBULATORY_CARE_PROVIDER_SITE_OTHER): Payer: Managed Care, Other (non HMO) | Admitting: Adult Health

## 2023-05-03 ENCOUNTER — Other Ambulatory Visit (HOSPITAL_COMMUNITY): Payer: Self-pay

## 2023-05-03 VITALS — BP 117/70 | HR 60 | Ht 66.0 in | Wt 224.0 lb

## 2023-05-03 DIAGNOSIS — R7309 Other abnormal glucose: Secondary | ICD-10-CM

## 2023-05-03 DIAGNOSIS — E538 Deficiency of other specified B group vitamins: Secondary | ICD-10-CM

## 2023-05-03 DIAGNOSIS — R202 Paresthesia of skin: Secondary | ICD-10-CM

## 2023-05-03 DIAGNOSIS — R413 Other amnesia: Secondary | ICD-10-CM

## 2023-05-03 DIAGNOSIS — I639 Cerebral infarction, unspecified: Secondary | ICD-10-CM | POA: Diagnosis not present

## 2023-05-03 DIAGNOSIS — E559 Vitamin D deficiency, unspecified: Secondary | ICD-10-CM

## 2023-05-03 DIAGNOSIS — G629 Polyneuropathy, unspecified: Secondary | ICD-10-CM

## 2023-05-03 MED ORDER — GABAPENTIN 100 MG PO CAPS
200.0000 mg | ORAL_CAPSULE | Freq: Every day | ORAL | 5 refills | Status: DC
  Filled 2023-05-03 – 2023-05-09 (×2): qty 60, 30d supply, fill #0

## 2023-05-03 MED ORDER — TOPIRAMATE 50 MG PO TABS
50.0000 mg | ORAL_TABLET | Freq: Two times a day (BID) | ORAL | 5 refills | Status: DC
  Filled 2023-05-03 – 2023-05-09 (×2): qty 60, 30d supply, fill #0

## 2023-05-03 NOTE — Progress Notes (Addendum)
[name] [address] [place]. [place] [address] [phone]       STROKE FOLLOW UP NOTE  [name] Date of Birth:  Jan 02, 1966 Medical Record Number:  [id number]   Reason for Referral: stroke follow up    SUBJECTIVE:   CHIEF COMPLAINT:  Chief Complaint  Patient presents with   Follow-up    Rm 3 alone Pt is well, reports she was having some facial numbness but it has currently resolved. Otherwise she is stable, no new concerns.      HPI:   Update 05/03/2023 JM: Patient returns for follow-up visit.  Previously seen by Aundra Millet, NP 6 months ago, reported recent falls due to tripping and continued right sided dysesthesias, topiramate dosage increased to 75 mg twice daily.   She did experience facial numbness bilaterally on 11/2, due to stroke history, she was concerned of recurrent stroke therefore proceeded to ED at [facility]  Low suspicion for stroke related as numbness affected entire face from jawline to hairline and no other associated symptoms.  Did complain of URI symptoms, CXR c/w atypical vs viral infection and discharged home with PCP follow-up.  Numbness resolved by the following day without any reoccurrence.  Continued right sided dysesthesias, can interfere with activity at times, increased dose of topiramate without any benefit.  Continued bilateral hand and foot numbness, able without worsening.  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 She does sleep poorly, started taking a friends prescription of gabapentin 100 mg nightly with helps with sleep and neuropathy, previously on gabapentin 300mg  but due to fatigue, was discontinued.  She has been tolerating lower dose well.  She also takes  melatonin and Benadryl to assist with sleep.  Denies new stroke/TIA symptoms.  Compliant on Plavix and atorvastatin.  Routinely follows with PCP for stroke risk factor management.      History provided for reference purposes only Update 10/26/2022 MM: Sarah Simpson is a 57 y.o. female who has been followed in this office for Stroke and neuropathy. Returns today for follow-up.  Patient denies any strokelike symptoms.  She remains on Plavix.  Follows closely with her primary care for stroke risk factor management.  Reports that she has had 3 falls due to tripping.  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  Often times she is looking backwards or talking to the clients. continues to have dyskinesias on the right side post stroke.  She remains  Topamax 50 mg twice a day. Couldn't tolerate gabapentin due to drowsiness   Update 05/25/2022 JM: Patient returns for 64-month stroke follow-up.  Overall stable without new stroke/TIA symptoms.  Reports residual right-sided dysesthesias, stable.  Remains on topiramate 50 mg twice daily for poststroke pain and chronic neuropathy.  She continues to experience nerve pain and numbness especially in the morning, tolerable but can interfere with daily activity.  Denies any worsening since prior visit.  She has also been having a flareup of sciatic pain, believes she possibly twisted wrong when getting up Christmas decorations.  She was evaluated by our sleep specialist Dr. Frances Furbish back in June but has not yet proceeded with sleep study due to financial reasons.   Completed cardiac monitor which was negative for  A-fib, referral placed to EP for ILR evaluation but was unable to be contacted to schedule. She plans on reaching out to her insurance to check on price and proceed if able at that time  Remains on Plavix and atorvastatin Blood pressure well controlled   Update 11/17/2021 JM: Patient returns for stroke  follow-up after prior visit 6 months ago.  She has been stable from stroke standpoint without new stroke/TIA symptoms.  Reports residual right sided dysthesia's which have been stable. Will also have occasional bilateral hand and feet neuropathy which is chronic and can fluctuate but does believe it has slightly worsened over the past few months.  She continues to work at [employer] where she walks majority of the day. Neuropathy does not specifically worsen with any activity or movement. Neuropathy is not debilitating.  She has remained on topiramate 25 mg twice daily, denies side effects.  She questions possibly increasing dosage.  Does have chronic issues with sleeping, uses OTC sleeping aides. She has had prior sleep study likely more than 10 years ago (unable to view via epic), does not believe it showed sleep apnea but did show restless leg syndrome.  She will have occasional difficulty with RLS at night and what sounds like PLMD.  Does have day time fatigue, snores, insomnia, and nightly nocturia.   Was previously on Plavix but had difficulty obtaining refill therefore she has since been on aspirin 81 mg daily, denies side effects.  Compliant on atorvastatin, denies side effects.  Blood pressure today 119/77.    No further concerns at this time.   Update 05/20/2021 JM: Returns for stroke follow-up after prior visit with Dr. Pearlean Brownie approx 5 mo ago for strokelike episode s/p tPA on 11/28/2020 and prior stroke 09/15/2020.  She has been stable from stroke standpoint without new stroke/TIA symptoms.  At prior visit, was switched from gabapentin to topiramate for poststroke paresthesias with significant improvement of tingling. Occasional "twinge" but doesn't last long. She had not had any additional severe headaches. Occasional mild headaches. EEG completed on 01/25/2021 to rule out seizures with remote history which was unremarkable. Reports continued poor vision chronically but worsened post stroke  -previously discussed evaluation by ophthalmology which she now wishes to pursue as she is insured.  She is working at [employer] in [place].  Remains on Plavix and atorvastatin -denies side effects.  Blood pressure today 122/84 on losartan 25 mg daily.  No further concerns at this time   Update 12/14/2020 Dr. Pearlean Brownie: She is seen today for follow-up after recent hospital admission from 11/28/2020 for strokelike symptoms.  She presented with sudden onset of right-sided weakness and numbness.  CT head was unremarkable CT angiogram of the head and neck did not show significant large vessel stenosis.  She received IV tPA and did well.  An MRI scan of the brain was obtained which was negative for acute stroke.  LDL cholesterol was 39 mg percent hemoglobin A1c was 5.7.  Echocardiogram so not repeated as she had one in April 2022 TEE which was normal.  She had an EEG done this admission which was negative for seizure activity.  Patient physical exam was consistent with functional components including lack of activation of the right cheek when asked to smile but she was able to puff her cheeks symmetrically or blow air with these and activated facial muscles when talking.  She was felt to have strokelike episode secondary to conversion reaction versus atypical migraine.  Patient states she is  done well since discharge.  She has increase her gabapentin to 300 mg in the morning, afternoon and 600 at night but see still not happy and is having right-sided facial paresthesias.  Headaches seem under better control.  She is on aspirin and Plavix tolerating them well with minor bruising and no bleeding.  Blood pressures well controlled.  She is tolerating Lipitor well without muscle aches and pains.   Initial visit 10/22/2020 JM: Sarah Simpson is being seen for hospital follow-up accompanied by her friend, Richard.  Reports residual blurred vision with reading and writing. Reports some visual impairment prior to her  stroke having to use reading glasses but has had greater difficulty with reading even with using her reading glasses since her stroke.  She has returned back to almost all prior activities.  She has not worked since the onset of COVID.  She is currently uninsured and in the process of applying for [employer] assistance program.  She also reports almost daily right sided headaches which have been present since her stroke.  She does have history of migraines but has not experienced any migraines since 01/2020 - these headaches are not consistent with her known migraines.  Reports continued constant bilateral hand and feet numbness/tingling.  Has been experiencing the symptoms for years but typically come and go but since her stroke they have been constant.  She will occasionally experience painful sensation.  She was prescribed gabapentin 100 mg 3 times daily at hospital discharge but she has been tolerating but does not believe it has been beneficial  She has remained on DAPT despite 3-week recommendation but denies bleeding or bruising Remains on atorvastatin 80 mg daily without myalgias Blood pressure today 125/83  No further concerns at this time   Stroke admission 09/15/2020 57 year old female with a history of hypertension, seizure like episodes in her 35s, heart murmur, migraine headaches,, tobacco use, obesity, chronic parasthesias bilat hands, depression, RLS, ADD who presented on 09/15/2020 with facial droop and speech difficulties that started 3/28.  Personally reviewed hospitalization pertinent progress notes, lab work and imaging with summary provided.  Evaluated by Dr. Pearlean Brownie with stroke work-up revealing acute subcortical infarct in the region of the caudate tail on the left due to large cortical infarct likely of cryptogenic etiology.  Recommended DAPT for 3 weeks and aspirin alone.  TEE negative for PFO or cardiac source of embolism identified.  Hypercoagulable labs negative. Loop  recorder not placed prior to d/c. Hormonal therapy for postmenopausal hot flashes discontinued.  LDL 133 -recommend initiating atorvastatin 80 mg daily.  Complaints of bilateral paresthesias/headaches/rashes unclear etiology with brain and C-spine imaging no acute findings to explain her symptoms.  Vasculitis labs negative.  No prior stroke history.  Acute subcortical infarct in the region of the caudate tail on the left due to large subcortical infarct likely of cryptogenic etiology   CT Code Stroke: A small age-indeterminate lacunar infarct is questioned within the left pons. There is no acute intracranial hemorrhage. MRI brain Diffusion abnormality in the caudate tail on the left most consistent with stroke. MRA head: Truncated examination. Small acute infarct of the left caudate tail. Normal intracranial MRA. Carotid Doppler Nearly normal bilaterally, no high grade stenosis 2D Echo EF 60-65%, No thrombus, no wall motion abnormality or shunt found.  Bilat LE Doppler no evidence of DVT Hypercoagulable labs negative TEE EF 60 to 65%.  Negative for PFO.  No evidence of cardiac source of embolism identified LDL 133 HgbA1c 5.7 No antiplatelet or  anticoagulant medications prior to admission Recommend DAPT with ASA 81mg  and Plavix 75mg  x 3 weeks then ASA 81mg  monotherapy Therapy recommendations:  none Disposition:   Home on 09/18/2020       ROS:   14 system review of systems performed and negative with exception of those listed in HPI  PMH:  Past Medical History:  Diagnosis Date   ADD (attention deficit disorder)    Allergy    Anemia    Arthritis    OA back    Cyst of right ovary    Depression    Heart murmur    History of kidney stones    Hypertension    Neuromuscular disorder (HCC)    RLS   Restless leg syndrome    Seizures (HCC)    in her 20's- none since-? etiology     PSH:  Past Surgical History:  Procedure Laterality Date   APPENDECTOMY  2008   BUBBLE STUDY   09/18/2020   Procedure: BUBBLE STUDY;  Surgeon: Pricilla Riffle, MD;  Location: [facility] ENDOSCOPY;  Service: Cardiovascular;;   BUNIONECTOMY  2009   x 2   TEE WITHOUT CARDIOVERSION N/A 09/18/2020   Procedure: TRANSESOPHAGEAL ECHOCARDIOGRAM (TEE);  Surgeon: Pricilla Riffle, MD;  Location: [facility] ENDOSCOPY;  Service: Cardiovascular;  Laterality: N/A;   WISDOM TOOTH EXTRACTION      Social History:  Social History   Socioeconomic History   Marital status: Single    Spouse name: Not on file   Number of children: Not on file   Years of education: Not on file   Highest education level: Not on file  Occupational History   Not on file  Tobacco Use   Smoking status: Former   Smokeless tobacco: Never   Tobacco comments:    on and off in the past, quit in 2002  Vaping Use   Vaping status: Never Used  Substance and Sexual Activity   Alcohol use: Yes    Comment: occ   Drug use: Never   Sexual activity: Not on file  Other Topics Concern   Not on file  Social History Narrative   Work or School: Tax adviser - [employer]      Home Situation: none      Spiritual Beliefs: Christian      Lifestyle: no regular exercise; so so            Social Determinants of Corporate investment banker Strain: Not on file  Food Insecurity: Not on file  Transportation Needs: Not on file  Physical Activity: Not on file  Stress: Not on file  Social Connections: Not on file  Intimate Partner Violence: Not on file    Family History:  Family History  Problem Relation Age of Onset   Colon cancer Mother 5   Heart attack Mother    Dementia Mother    Colon cancer Father 60   Colon polyps Neg Hx    Esophageal cancer Neg Hx    Stomach cancer Neg Hx    Rectal cancer Neg Hx    Sleep apnea Neg Hx     Medications:   Current Outpatient Medications on File Prior to Visit  Medication Sig Dispense Refill   acetaminophen (TYLENOL) 325 MG tablet Take 650 mg by mouth as needed for moderate pain or headache.      atorvastatin (LIPITOR) 80 MG tablet Take 1 tablet (80 mg) by mouth daily at 6 PM. 30 tablet 0   clopidogrel (PLAVIX) 75 MG  tablet Take 1 tablet by mouth daily. 90 tablet 3   escitalopram (LEXAPRO) 10 MG tablet Take 1 tablet (10 mg total) by mouth daily. 90 tablet 1   losartan (COZAAR) 25 MG tablet Take 1 tablet (25 mg total) by mouth daily. 30 tablet 0   Melatonin 10 MG TABS Take 10 mg by mouth at bedtime.     Multiple Vitamin (MULTIVITAMIN) tablet Take 1 tablet by mouth daily.     valACYclovir (VALTREX) 500 MG tablet Take 1/2 tablet by mouth daily. 90 tablet 3   No current facility-administered medications on file prior to visit.    Allergies:   Allergies  Allergen Reactions   Ciprofloxacin Swelling   Levaquin [Levofloxacin] Anaphylaxis    Hives and throat swelling    Amoxil [Amoxicillin] Rash    Extreme itching limbs and trunk    Dynacin [Minocycline] Rash   Sulfamethazine Rash      OBJECTIVE:  Physical Exam  Vitals:   05/03/23 0823  BP: 117/70  Pulse: 60  Weight: 224 lb (101.6 kg)  Height: 5\' 6"  (1.676 m)   Body mass index is 36.15 kg/m. No results found.  General: well developed, well nourished, very pleasant middle-age Caucasian female, seated, in no evident distress  Neurologic Exam Mental Status: Awake and fully alert.   Fluent speech and language.  Oriented to place and time. Recent memory mildly impaired and remote memory intact. Attention span, concentration and fund of knowledge mostly appropriate, occasional delayed recall. Mood and affect appropriate.  Cranial Nerves: Pupils equal, briskly reactive to light. Extraocular movements full without nystagmus. Visual fields full to confrontation. Hearing intact. Facial sensation intact. Face, tongue, palate moves normally and symmetrically.  Motor: Normal bulk and tone. Normal strength in all tested extremity muscles Sensory.: intact to touch , pinprick , position and vibratory sensation except subjective  right-sided numbness and intermittent bilateral hand and foot numbness Coordination: Rapid alternating movements normal in all extremities. Finger-to-nose and heel-to-shin performed accurately bilaterally. Gait and Station: Arises from chair without difficulty. Stance is normal. Gait demonstrates normal stride length and balance without use of AD. Tandem walk and heel toe without difficulty Reflexes: 1+ and symmetric. Toes downgoing.      05/03/2023    9:26 AM  Montreal Cognitive Assessment   Visuospatial/ Executive (0/5) 5  Naming (0/3) 3  Attention: Read list of digits (0/2) 2  Attention: Read list of letters (0/1) 1  Attention: Serial 7 subtraction starting at 100 (0/3) 3  Language: Repeat phrase (0/2) 2  Language : Fluency (0/1) 0  Abstraction (0/2) 2  Delayed Recall (0/5) 3  Orientation (0/6) 6  Total 27        ASSESSMENT: Sarah Simpson is a 57 y.o. 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 Vascular risk factors include HTN, HLD, remote seizure history, migraine headaches, former tobacco use and obesity.  Main concern today is in regards to short-term memory loss which worsened over the summer.  Episode of complete facial numbness 11/2    PLAN:  Mild cognitive impairment MOCA today 27/30 (within normal limits) Suspect post stroke, possibly worsening over the summer in setting of higher topiramate dosage (was increased back in May), question underlying depression/anxiety contributing  Recommend reducing topiramate dosage back to 50 mg twice daily, if after 2-3 weeks post stroke paresthesias and neuropathy stable, can further reduce to 25 mg twice daily.  If noted benefit in  regards to memory and no change in neuropathy, can discontinue.  If she notices improvement of memory but worsening neuropathy, advised to call for alternative options Will check lab work today for  look for reversible causes If memory complaints persist, can consider repeat MRI brain Family history of dementia (mother passed at age 83 after dx of dementia 6 months prior)  Cryptogenic stroke: Strokelike episode: Residual deficit: post stroke dysesthesias, will update prescription for gabapentin 100-200mg  nightly  Cardiac monitor negative for A-fib Continue clopidogrel 75 mg daily and atorvastatin 80 mg daily for secondary stroke prevention Discussed secondary stroke prevention measures and importance of close PCP follow up for aggressive stroke risk factor management including BP goal<130/90 and HLD with LDL goal<70  Bilateral hand and feet paresthesias:  Chronic issue of unknown etiology reporting worsening post stroke.   Overall stable at this time Continue to monitor  Consider EMG/NCV if symptoms worsen  Facial numbness: Bilateral facial numbness from hairline to jaw, resolved by the following day, no associated pain or facial weakness Possibly in setting of URI with increased sinus congestion  Low suspicion stroke as no other neurological deficits/symptoms and affecting face bilaterally Continue to monitor for now, advised to call with any reoccurrence  Remote seizure history:  EEG 01/25/2021 unremarkable.  Not currently on ASMs.  No indication at this time.   Continue to monitor.    Follow up in 6 months or call earlier if needed    CC:  PCP: Shon Hale, MD    I spent 45 minutes of face-to-face and non-face-to-face time with patient.  This included previsit chart review, lab review, study review, order entry, electronic health record documentation, patient education and discussion regarding above diagnoses and treatment plan and answered all the questions to patient's satisfaction  Ihor Austin, [facility]  [facility] [address] [place], [place] [address]  Phone [phone] Fax [phone] Note: This document was  prepared with digital dictation and possible smart phrase technology. Any transcriptional errors that result from this process are unintentional.

## 2023-05-03 NOTE — Patient Instructions (Addendum)
Your Plan:  Will send in a prescription for gabapentin 200mg  nightly to help with sleep and neuropathy  Decrease topamax down to 50mg  twice daily to see if this helps improve memory, if memory improves and your neuropathy is stable, can further reduce down to 25 mg (half tab) twice daily after 2-3 weeks. If neuropathy remains stable without worsening, can completely stop after an additional 2-3 weeks. If neuropathy worsens off topamax but memory improves, please let me know to discuss other treatment options  We will check lab work today to look for other causes of memory loss  Continue Plavix and atorvastatin as well as routine follow-up with PCP for stroke risk factor management     Follow-up in 6 months or call earlier if needed     Thank you for coming to see [place] at [facility]. I hope we have been able to provide you high quality care today.  You may receive a patient satisfaction survey over the next few weeks. We would appreciate your feedback and comments so that we may continue to improve ourselves and the health of our patients.

## 2023-05-04 ENCOUNTER — Encounter: Payer: Self-pay | Admitting: Adult Health

## 2023-05-04 LAB — VITAMIN D 25 HYDROXY (VIT D DEFICIENCY, FRACTURES): Vit D, 25-Hydroxy: 27.8 ng/mL — ABNORMAL LOW (ref 30.0–100.0)

## 2023-05-04 LAB — THYROID PANEL WITH TSH
Free Thyroxine Index: 1.3 (ref 1.2–4.9)
T3 Uptake Ratio: 21 % — ABNORMAL LOW (ref 24–39)
T4, Total: 6.2 ug/dL (ref 4.5–12.0)
TSH: 1.39 u[IU]/mL (ref 0.450–4.500)

## 2023-05-04 LAB — VITAMIN B12: Vitamin B-12: 516 pg/mL (ref 232–1245)

## 2023-05-04 LAB — HEMOGLOBIN A1C
Est. average glucose Bld gHb Est-mCnc: 151 mg/dL
Hgb A1c MFr Bld: 6.9 % — ABNORMAL HIGH (ref 4.8–5.6)

## 2023-05-08 ENCOUNTER — Other Ambulatory Visit: Payer: Self-pay

## 2023-05-09 ENCOUNTER — Other Ambulatory Visit (HOSPITAL_COMMUNITY): Payer: Self-pay

## 2023-05-10 ENCOUNTER — Other Ambulatory Visit (HOSPITAL_COMMUNITY): Payer: Self-pay

## 2023-05-10 ENCOUNTER — Other Ambulatory Visit: Payer: Self-pay

## 2023-05-10 MED ORDER — LOSARTAN POTASSIUM 25 MG PO TABS
25.0000 mg | ORAL_TABLET | Freq: Every day | ORAL | 0 refills | Status: DC
  Filled 2023-05-10: qty 30, 30d supply, fill #0

## 2023-05-10 MED ORDER — ATORVASTATIN CALCIUM 80 MG PO TABS
80.0000 mg | ORAL_TABLET | Freq: Every day | ORAL | 0 refills | Status: DC
  Filled 2023-05-10: qty 30, 30d supply, fill #0

## 2023-05-10 MED ORDER — VALACYCLOVIR HCL 500 MG PO TABS
250.0000 mg | ORAL_TABLET | Freq: Every day | ORAL | 3 refills | Status: DC
  Filled 2023-05-10: qty 45, 90d supply, fill #0
  Filled 2023-08-06: qty 45, 90d supply, fill #1
  Filled 2023-10-29: qty 45, 90d supply, fill #2
  Filled 2024-01-28 – 2024-02-04 (×2): qty 45, 90d supply, fill #3

## 2023-05-11 ENCOUNTER — Other Ambulatory Visit (HOSPITAL_COMMUNITY): Payer: Self-pay

## 2023-05-11 MED ORDER — METFORMIN HCL ER 500 MG PO TB24
500.0000 mg | ORAL_TABLET | Freq: Every day | ORAL | 3 refills | Status: DC
  Filled 2023-05-11: qty 180, 90d supply, fill #0
  Filled 2023-08-20: qty 180, 90d supply, fill #1
  Filled 2023-11-19: qty 180, 90d supply, fill #2
  Filled 2024-03-31: qty 180, 90d supply, fill #3

## 2023-05-12 ENCOUNTER — Other Ambulatory Visit (HOSPITAL_COMMUNITY): Payer: Self-pay

## 2023-05-23 ENCOUNTER — Encounter: Payer: Self-pay | Admitting: Adult Health

## 2023-05-23 DIAGNOSIS — R413 Other amnesia: Secondary | ICD-10-CM

## 2023-05-23 NOTE — Telephone Encounter (Signed)
Can refer to speech therapy for cognitive therapy and also recommend evaluation with neuropsychology for neurocognitive evaluation. If patient agrees, please place order. Please send to location with adequate appointment times, last I knew Dr. Kieth Brightly was scheduling out over 6 months so please do not send to him. Thank you.

## 2023-06-04 ENCOUNTER — Other Ambulatory Visit (HOSPITAL_COMMUNITY): Payer: Self-pay

## 2023-06-05 ENCOUNTER — Other Ambulatory Visit (HOSPITAL_COMMUNITY): Payer: Self-pay

## 2023-06-05 ENCOUNTER — Other Ambulatory Visit: Payer: Self-pay

## 2023-06-05 MED ORDER — ATORVASTATIN CALCIUM 80 MG PO TABS
80.0000 mg | ORAL_TABLET | Freq: Every day | ORAL | 0 refills | Status: DC
  Filled 2023-06-05: qty 30, 30d supply, fill #0

## 2023-06-05 MED ORDER — LOSARTAN POTASSIUM 25 MG PO TABS
25.0000 mg | ORAL_TABLET | Freq: Every day | ORAL | 0 refills | Status: DC
  Filled 2023-06-05: qty 30, 30d supply, fill #0

## 2023-06-07 ENCOUNTER — Other Ambulatory Visit: Payer: Self-pay | Admitting: Adult Health

## 2023-06-07 ENCOUNTER — Telehealth: Payer: Self-pay | Admitting: Adult Health

## 2023-06-07 DIAGNOSIS — R413 Other amnesia: Secondary | ICD-10-CM

## 2023-06-07 NOTE — Telephone Encounter (Signed)
Referral for neuropsychology fax to [facility]. Phone: [phone], [phone]

## 2023-07-07 ENCOUNTER — Other Ambulatory Visit (HOSPITAL_COMMUNITY): Payer: Self-pay

## 2023-07-10 ENCOUNTER — Other Ambulatory Visit (HOSPITAL_COMMUNITY): Payer: Self-pay

## 2023-07-11 ENCOUNTER — Other Ambulatory Visit (HOSPITAL_COMMUNITY): Payer: Self-pay

## 2023-07-11 ENCOUNTER — Other Ambulatory Visit: Payer: Self-pay

## 2023-07-11 MED ORDER — LOSARTAN POTASSIUM 25 MG PO TABS
25.0000 mg | ORAL_TABLET | Freq: Every day | ORAL | 0 refills | Status: DC
  Filled 2023-07-11: qty 30, 30d supply, fill #0

## 2023-07-11 MED ORDER — ATORVASTATIN CALCIUM 80 MG PO TABS
80.0000 mg | ORAL_TABLET | Freq: Every day | ORAL | 0 refills | Status: DC
  Filled 2023-07-11: qty 30, 30d supply, fill #0

## 2023-08-06 ENCOUNTER — Other Ambulatory Visit (HOSPITAL_COMMUNITY): Payer: Self-pay

## 2023-08-07 ENCOUNTER — Other Ambulatory Visit (HOSPITAL_COMMUNITY): Payer: Self-pay

## 2023-08-07 MED ORDER — LOSARTAN POTASSIUM 25 MG PO TABS
25.0000 mg | ORAL_TABLET | Freq: Every day | ORAL | 0 refills | Status: DC
  Filled 2023-08-07: qty 30, 30d supply, fill #0

## 2023-08-07 MED ORDER — ATORVASTATIN CALCIUM 80 MG PO TABS
80.0000 mg | ORAL_TABLET | Freq: Every day | ORAL | 0 refills | Status: DC
  Filled 2023-08-07: qty 30, 30d supply, fill #0

## 2023-08-21 ENCOUNTER — Other Ambulatory Visit (HOSPITAL_COMMUNITY): Payer: Self-pay

## 2023-09-03 ENCOUNTER — Other Ambulatory Visit (HOSPITAL_COMMUNITY): Payer: Self-pay

## 2023-09-04 ENCOUNTER — Other Ambulatory Visit: Payer: Self-pay

## 2023-09-04 ENCOUNTER — Other Ambulatory Visit (HOSPITAL_COMMUNITY): Payer: Self-pay

## 2023-09-04 MED ORDER — ESCITALOPRAM OXALATE 10 MG PO TABS
10.0000 mg | ORAL_TABLET | Freq: Every day | ORAL | 3 refills | Status: AC
  Filled 2023-09-04: qty 90, 90d supply, fill #0
  Filled 2023-12-10: qty 90, 90d supply, fill #1
  Filled 2023-12-19 – 2024-03-06 (×2): qty 90, 90d supply, fill #2
  Filled 2024-06-09 – 2024-06-11 (×2): qty 90, 90d supply, fill #3

## 2023-09-04 MED ORDER — LOSARTAN POTASSIUM 25 MG PO TABS
25.0000 mg | ORAL_TABLET | Freq: Every day | ORAL | 0 refills | Status: DC
  Filled 2023-09-04: qty 30, 30d supply, fill #0

## 2023-09-04 MED ORDER — ATORVASTATIN CALCIUM 80 MG PO TABS
80.0000 mg | ORAL_TABLET | Freq: Every day | ORAL | 0 refills | Status: DC
  Filled 2023-09-04: qty 30, 30d supply, fill #0

## 2023-09-05 ENCOUNTER — Other Ambulatory Visit (HOSPITAL_COMMUNITY): Payer: Self-pay

## 2023-09-05 MED ORDER — OZEMPIC (0.25 OR 0.5 MG/DOSE) 2 MG/3ML ~~LOC~~ SOPN
0.2500 mg | PEN_INJECTOR | SUBCUTANEOUS | 0 refills | Status: DC
  Filled 2023-09-05: qty 3, 42d supply, fill #0

## 2023-10-05 ENCOUNTER — Other Ambulatory Visit: Payer: Self-pay

## 2023-10-05 ENCOUNTER — Other Ambulatory Visit (HOSPITAL_COMMUNITY): Payer: Self-pay

## 2023-10-05 MED ORDER — ATORVASTATIN CALCIUM 80 MG PO TABS
80.0000 mg | ORAL_TABLET | Freq: Every day | ORAL | 3 refills | Status: AC
  Filled 2023-10-05: qty 90, 90d supply, fill #0
  Filled 2024-01-01: qty 90, 90d supply, fill #1
  Filled 2024-03-31: qty 90, 90d supply, fill #2
  Filled 2024-07-07: qty 90, 90d supply, fill #3

## 2023-10-05 MED ORDER — LOSARTAN POTASSIUM 25 MG PO TABS
25.0000 mg | ORAL_TABLET | Freq: Every day | ORAL | 3 refills | Status: AC
  Filled 2023-10-05: qty 90, 90d supply, fill #0
  Filled 2024-01-01: qty 90, 90d supply, fill #1
  Filled 2024-04-16: qty 90, 90d supply, fill #2
  Filled 2024-07-07: qty 90, 90d supply, fill #3

## 2023-10-06 ENCOUNTER — Other Ambulatory Visit (HOSPITAL_COMMUNITY): Payer: Self-pay

## 2023-10-15 ENCOUNTER — Other Ambulatory Visit (HOSPITAL_COMMUNITY): Payer: Self-pay

## 2023-10-16 ENCOUNTER — Other Ambulatory Visit (HOSPITAL_COMMUNITY): Payer: Self-pay

## 2023-10-18 ENCOUNTER — Other Ambulatory Visit (HOSPITAL_COMMUNITY): Payer: Self-pay

## 2023-10-19 ENCOUNTER — Other Ambulatory Visit (HOSPITAL_COMMUNITY): Payer: Self-pay

## 2023-10-19 MED ORDER — OZEMPIC (0.25 OR 0.5 MG/DOSE) 2 MG/3ML ~~LOC~~ SOPN
PEN_INJECTOR | SUBCUTANEOUS | 0 refills | Status: DC
  Filled 2023-10-19: qty 3, 28d supply, fill #0

## 2023-10-29 ENCOUNTER — Other Ambulatory Visit (HOSPITAL_COMMUNITY): Payer: Self-pay

## 2023-10-30 ENCOUNTER — Other Ambulatory Visit (HOSPITAL_COMMUNITY): Payer: Self-pay

## 2023-11-15 NOTE — Progress Notes (Unsigned)
 [name] [address] [place]. [place] [address] [phone]       STROKE FOLLOW UP NOTE  [name] Date of Birth:  Oct 04, 1965 Medical Record Number:  [id number]   Reason for Referral: stroke follow up    SUBJECTIVE:   CHIEF COMPLAINT:  No chief complaint on file.    HPI:   Update 11/16/2023 JM: Patient returns for 59-month follow-up visit.  After prior visit, patient sent MyChart message with concerns of continued cognitive difficulties and was even placed on improvement plan in 05/2023.  She was referred to neuropsychology for further testing as MMSE in office essentially normal.  She did note some improvement of cognitive symptoms after reducing gabapentin  and eliminating topiramate  and nightly Benadryl  and thankfully removed from improvement plan although she still feels as though having some problems with memory and cognition.  Underwent neurocognitive testing 3/26 by Dr. Micael Adas noted testing grossly within expectations, some variability with regards to working memory but this was mild and did not rise to level of cognitive diagnosis and possibly related to pre-existing ADHD, depression, sleep disturbance and vitamin D  deficiency.  Recommended consideration of treatment for ADHD such as Strattera and consider sleep study to assess for sleep apnea or other sleep disorders.    No new stroke/TIA symptoms.  Continued right sided dysesthesias but no worsening after discontinuing topiramate .  Reports compliance on Plavix  and atorvastatin .  Routinely follows with PCP Dr. Donia Furlough for stroke risk factor management.  Lab work after prior visit showed slightly low vitamin D  and A1c at 6.9, she was advised to follow-up with PCP for further treatment recommendations.  B12 and thyroid  panel largely within normal limits.  She was started on metformin  in 04/2023 and semaglutide  08/2023      History provided for reference purposes only Update 05/03/2023  JM: Patient returns for follow-up visit.  Previously seen by [name], NP 6 months ago, reported recent falls due to tripping and continued right sided dysesthesias, topiramate  dosage increased to 75 mg twice daily.   She did experience facial numbness bilaterally on 11/2, due to stroke history, she was concerned of recurrent stroke therefore proceeded to ED at [facility].  Low suspicion for stroke related as numbness affected entire face from jawline to hairline and no other associated symptoms.  Did complain of URI symptoms, CXR c/w atypical vs viral infection and discharged home with PCP follow-up.  Numbness resolved by the following day without any reoccurrence.  Continued right sided dysesthesias, can interfere with activity at times, increased dose of topiramate  without any benefit.  Continued bilateral hand and foot numbness, able without worsening.  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 She does sleep poorly, started taking a friends prescription of gabapentin  100 mg nightly with helps with sleep and neuropathy, previously on gabapentin  300mg  but due to fatigue, was discontinued.  She has been tolerating lower dose well.  She also takes melatonin and Benadryl  to assist with sleep.  Denies new stroke/TIA symptoms.  Compliant on Plavix  and atorvastatin .  Routinely follows with PCP for stroke risk factor management.   Update 10/26/2022 MM: [name] is a 58 y.o. female who has been followed in this office for Stroke and neuropathy. Returns today for follow-up.  Patient denies any strokelike  symptoms.  She remains on Plavix .  Follows closely with her primary care for stroke risk factor management.  Reports that she has had 3 falls due to tripping.   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  Often times she is looking backwards or talking to the clients. continues to have dyskinesias on the right side post stroke.  She remains  Topamax  50 mg twice a day. Couldn't tolerate gabapentin  due to drowsiness   Update 05/25/2022 JM: Patient returns for 59-month stroke follow-up.  Overall stable without new stroke/TIA symptoms.  Reports residual right-sided dysesthesias, stable.  Remains on topiramate  50 mg twice daily for poststroke pain and chronic neuropathy.  She continues to experience nerve pain and numbness especially in the morning, tolerable but can interfere with daily activity.  Denies any worsening since prior visit.  She has also been having a flareup of sciatic pain, believes she possibly twisted wrong when getting up Christmas decorations.  She was evaluated by our sleep specialist Dr. Omar Bibber back in June but has not yet proceeded with sleep study due to financial reasons.   Completed cardiac monitor which was negative for A-fib, referral placed to EP for ILR evaluation but was unable to be contacted to schedule. She plans on reaching out to her insurance to check on price and proceed if able at that time  Remains on Plavix  and atorvastatin  Blood pressure well controlled   Update 11/17/2021 JM: Patient returns for stroke follow-up after prior visit 6 months ago.  She has been stable from stroke standpoint without new stroke/TIA symptoms.  Reports residual right sided dysthesia's which have been stable. Will also have occasional bilateral hand and feet neuropathy which is chronic and can fluctuate but does believe it has slightly worsened over the past few months.  She continues to work at [employer] where she walks majority of the day. Neuropathy does not specifically worsen with any activity or movement. Neuropathy is not debilitating.  She has remained on topiramate  25 mg twice  daily, denies side effects.  She questions possibly increasing dosage.  Does have chronic issues with sleeping, uses OTC sleeping aides. She has had prior sleep study likely more than 10 years ago (unable to view via epic), does not believe it showed sleep apnea but did show restless leg syndrome.  She will have occasional difficulty with RLS at night and what sounds like PLMD.  Does have day time fatigue, snores, insomnia, and nightly nocturia.   Was previously on Plavix  but had difficulty obtaining refill therefore she has since been on aspirin  81 mg daily, denies side effects.  Compliant on atorvastatin , denies side effects.  Blood pressure today 119/77.    No further concerns at this time.   Update 05/20/2021 JM: Returns for stroke follow-up after prior visit with Dr. Janett Medin approx 5 mo ago for strokelike episode s/p tPA on 11/28/2020 and prior stroke 09/15/2020.  She has been stable from stroke standpoint without new stroke/TIA symptoms.  At prior visit, was switched from gabapentin  to topiramate  for poststroke paresthesias with significant improvement of tingling. Occasional "twinge" but doesn't last long. She had not had any additional severe headaches. Occasional mild headaches. EEG completed on 01/25/2021 to rule out seizures with remote history which was unremarkable. Reports continued poor vision chronically but worsened post stroke -previously discussed evaluation by ophthalmology which she now wishes to pursue as she is insured.  She is working at [employer]  in [place].  Remains on Plavix  and atorvastatin  -denies side effects.  Blood pressure today 122/84 on losartan  25 mg daily.  No further concerns at this time   Update 12/14/2020 Dr. Janett Medin: She is seen today for follow-up after recent hospital admission from 11/28/2020 for strokelike symptoms.  She presented with sudden onset of right-sided weakness and numbness.  CT head was unremarkable CT angiogram of the head and neck did not show  significant large vessel stenosis.  She received IV tPA and did well.  An MRI scan of the brain was obtained which was negative for acute stroke.  LDL cholesterol was 39 mg percent hemoglobin A1c was 5.7.  Echocardiogram so not repeated as she had one in April 2022 TEE which was normal.  She had an EEG done this admission which was negative for seizure activity.  Patient physical exam was consistent with functional components including lack of activation of the right cheek when asked to smile but she was able to puff her cheeks symmetrically or blow air with these and activated facial muscles when talking.  She was felt to have strokelike episode secondary to conversion reaction versus atypical migraine.  Patient states she is done well since discharge.  She has increase her gabapentin  to 300 mg in the morning, afternoon and 600 at night but see still not happy and is having right-sided facial paresthesias.  Headaches seem under better control.  She is on aspirin  and Plavix  tolerating them well with minor bruising and no bleeding.  Blood pressures well controlled.  She is tolerating Lipitor  well without muscle aches and pains.   Initial visit 10/22/2020 JM: Ms. Mace is being seen for hospital follow-up accompanied by her friend, Richard.  Reports residual blurred vision with reading and writing. Reports some visual impairment prior to her stroke having to use reading glasses but has had greater difficulty with reading even with using her reading glasses since her stroke.  She has returned back to almost all prior activities.  She has not worked since the onset of COVID.  She is currently uninsured and in the process of applying for [employer] assistance program.  She also reports almost daily right sided headaches which have been present since her stroke.  She does have history of migraines but has not experienced any migraines since 01/2020 - these headaches are not consistent with her known  migraines.  Reports continued constant bilateral hand and feet numbness/tingling.  Has been experiencing the symptoms for years but typically come and go but since her stroke they have been constant.  She will occasionally experience painful sensation.  She was prescribed gabapentin  100 mg 3 times daily at hospital discharge but she has been tolerating but does not believe it has been beneficial  She has remained on DAPT despite 3-week recommendation but denies bleeding or bruising Remains on atorvastatin  80 mg daily without myalgias Blood pressure today 125/83  No further concerns at this time   Stroke admission 09/15/2020 58 year old female with a history of hypertension, seizure like episodes in her 68s, heart murmur, migraine headaches,, tobacco use, obesity, chronic parasthesias bilat hands, depression, RLS, ADD who presented on 09/15/2020 with facial droop and speech difficulties that started 3/28.  Personally reviewed hospitalization pertinent progress notes, lab work and imaging with summary provided.  Evaluated by Dr. Janett Medin with stroke work-up revealing acute subcortical infarct in the region of the caudate tail on the left due to large cortical infarct likely of cryptogenic etiology.  Recommended DAPT for  3 weeks and aspirin  alone.  TEE negative for PFO or cardiac source of embolism identified.  Hypercoagulable labs negative. Loop recorder not placed prior to d/c. Hormonal therapy for postmenopausal hot flashes discontinued.  LDL 133 -recommend initiating atorvastatin  80 mg daily.  Complaints of bilateral paresthesias/headaches/rashes unclear etiology with brain and C-spine imaging no acute findings to explain her symptoms.  Vasculitis labs negative.  No prior stroke history.  Acute subcortical infarct in the region of the caudate tail on the left due to large subcortical infarct likely of cryptogenic etiology   CT Code Stroke: A small age-indeterminate lacunar infarct is questioned within  the left pons. There is no acute intracranial hemorrhage. MRI brain Diffusion abnormality in the caudate tail on the left most consistent with stroke. MRA head: Truncated examination. Small acute infarct of the left caudate tail. Normal intracranial MRA. Carotid Doppler Nearly normal bilaterally, no high grade stenosis 2D Echo EF 60-65%, No thrombus, no wall motion abnormality or shunt found.  Bilat LE Doppler no evidence of DVT Hypercoagulable labs negative TEE EF 60 to 65%.  Negative for PFO.  No evidence of cardiac source of embolism identified LDL 133 HgbA1c 5.7 No antiplatelet or anticoagulant medications prior to admission Recommend DAPT with ASA 81mg  and Plavix  75mg  x 3 weeks then ASA 81mg  monotherapy Therapy recommendations:  none Disposition:   Home on 09/18/2020       ROS:   14 system review of systems performed and negative with exception of those listed in HPI  PMH:  Past Medical History:  Diagnosis Date   ADD (attention deficit disorder)    Allergy    Anemia    Arthritis    OA back    Cyst of right ovary    Depression    Heart murmur    History of kidney stones    Hypertension    Neuromuscular disorder (HCC)    RLS   Restless leg syndrome    Seizures (HCC)    in her 20's- none since-? etiology     PSH:  Past Surgical History:  Procedure Laterality Date   APPENDECTOMY  2008   BUBBLE STUDY  09/18/2020   Procedure: BUBBLE STUDY;  Surgeon: Elmyra Haggard, MD;  Location: [facility] ENDOSCOPY;  Service: Cardiovascular;;   BUNIONECTOMY  2009   x 2   TEE WITHOUT CARDIOVERSION N/A 09/18/2020   Procedure: TRANSESOPHAGEAL ECHOCARDIOGRAM (TEE);  Surgeon: Elmyra Haggard, MD;  [facility] END[facility]COPY;  Service: Cardiovascular;  Laterality: N/A;   WISDOM TOOTH EXTRACTION      Social History:  Social History   Socioeconomic History   Marital status: Single    Spouse name: Not on file   Number of children: Not on file   Years of education: Not on file   Highest education  level: Not on file  Occupational History   Not on file  Tobacco Use   Smoking status: Former   Smokeless tobacco: Never   Tobacco comments:    on and off in the past, quit in 2002  Vaping Use   Vaping status: Never Used  Substance and Sexual Activity   Alcohol use: Yes    Comment: occ   Drug use: Never   Sexual activity: Not on file  Other Topics Concern   Not on file  Social History Narrative   Work or School: Customer service manager      Home Situation: none      Spiritual Beliefs: Curator  Lifestyle: no regular exercise; so so            Social Drivers of Corporate investment banker Strain: Not on file  Food Insecurity: Not on file  Transportation Needs: Not on file  Physical Activity: Not on file  Stress: Not on file  Social Connections: Not on file  Intimate Partner Violence: Not on file    Family History:  Family History  Problem Relation Age of Onset   Colon cancer Mother 10   Heart attack Mother    Dementia Mother    Colon cancer Father 41   Colon polyps Neg Hx    Esophageal cancer Neg Hx    Stomach cancer Neg Hx    Rectal cancer Neg Hx    Sleep apnea Neg Hx     Medications:   Current Outpatient Medications on File Prior to Visit  Medication Sig Dispense Refill   acetaminophen  (TYLENOL ) 325 MG tablet Take 650 mg by mouth as needed for moderate pain or headache.     atorvastatin  (LIPITOR ) 80 MG tablet Take 1 tablet (80 mg total) by mouth daily at 6 P.M. 90 tablet 3   clopidogrel  (PLAVIX ) 75 MG tablet Take 1 tablet by mouth daily. 90 tablet 3   escitalopram  (LEXAPRO ) 10 MG tablet Take 1 tablet (10 mg total) by mouth daily. 90 tablet 3   gabapentin  (NEURONTIN ) 100 MG capsule Take 2 capsules (200 mg total) by mouth at bedtime. 60 capsule 5   losartan  (COZAAR ) 25 MG tablet Take 1 tablet (25 mg total) by mouth daily. 90 tablet 3   Melatonin 10 MG TABS Take 10 mg by mouth at bedtime.     metFORMIN  (GLUCOPHAGE -XR) 500 MG 24 hr tablet Take 1 tablet by  mouth once daily with dinner for one week, then take 1 tablet twice a day thereafter. 180 tablet 3   Multiple Vitamin (MULTIVITAMIN) tablet Take 1 tablet by mouth daily.     Semaglutide ,0.25 or 0.5MG /DOS, (OZEMPIC , 0.25 OR 0.5 MG/DOSE,) 2 MG/3ML SOPN Inject 0.25 mg into the skin once a week for 4 weeks then increase to 0.5 mg weekly thereafter. 3 mL 0   topiramate  (TOPAMAX ) 50 MG tablet Take 1 tablet (50 mg total) by mouth 2 (two) times daily. 60 tablet 5   valACYclovir  (VALTREX ) 500 MG tablet Take 0.5 tablet by mouth daily. 90 tablet 3   No current facility-administered medications on file prior to visit.    Allergies:   Allergies  Allergen Reactions   Ciprofloxacin  Swelling   Levaquin  [Levofloxacin ] Anaphylaxis    Hives and throat swelling    Amoxil  [Amoxicillin ] Rash    Extreme itching limbs and trunk    Dynacin [Minocycline] Rash   Sulfamethazine Rash      OBJECTIVE:  Physical Exam  There were no vitals filed for this visit.  There is no height or weight on file to calculate BMI. No results found.  General: well developed, well nourished, very pleasant middle-age Caucasian female, seated, in no evident distress  Neurologic Exam Mental Status: Awake and fully alert.   Fluent speech and language.  Oriented to place and time. Recent memory mildly impaired and remote memory intact. Attention span, concentration and fund of knowledge mostly appropriate, occasional delayed recall. Mood and affect appropriate.  Cranial Nerves: Pupils equal, briskly reactive to light. Extraocular movements full without nystagmus. Visual fields full to confrontation. Hearing intact. Facial sensation intact. Face, tongue, palate moves normally and symmetrically.  Motor: Normal bulk and tone.  Normal strength in all tested extremity muscles Sensory.: intact to touch , pinprick , position and vibratory sensation except subjective right-sided numbness and intermittent bilateral hand and foot  numbness Coordination: Rapid alternating movements normal in all extremities. Finger-to-nose and heel-to-shin performed accurately bilaterally. Gait and Station: Arises from chair without difficulty. Stance is normal. Gait demonstrates normal stride length and balance without use of AD. Tandem walk and heel toe without difficulty Reflexes: 1+ and symmetric. Toes downgoing.      05/03/2023    9:26 AM  Montreal Cognitive Assessment   Visuospatial/ Executive (0/5) 5  Naming (0/3) 3  Attention: Read list of digits (0/2) 2  Attention: Read list of letters (0/1) 1  Attention: Serial 7 subtraction starting at 100 (0/3) 3  Language: Repeat phrase (0/2) 2  Language : Fluency (0/1) 0  Abstraction (0/2) 2  Delayed Recall (0/5) 3  Orientation (0/6) 6  Total 27        ASSESSMENT: Sarah Simpson is a 58 y.o. 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 Vascular risk factors include HTN, HLD, remote seizure history, migraine headaches, former tobacco use and obesity.  Main concern today is in regards to short-term memory loss which worsened over the summer of 2024.  Episode of transient bilateral facial numbness 04/22/2023    PLAN:  Mild cognitive impairment MOCA today 27/30 (within normal limits) Neurocognitive evaluation 08/2023 largely within normal limits, some impairment of working memory/complex attention, possibly more related to underlying ADHD, depression, chronic sleep disturbance and vitamin D  deficiency Continue to remain off topiramate  and nightly Benadryl  Recommend further evaluation of sleep disturbance -referral placed to [facility] sleep clinic for further evaluation *** Continue to follow with PCP for vitamin D  deficiency treatment Consider repeat MRI brain if she feels cognition worsens Family history of dementia (mother passed at age 64 after dx of dementia 6 months  prior)  Cryptogenic stroke: Strokelike episode: Residual deficit: post stroke dysesthesias, will update prescription for gabapentin  100-200mg  nightly  Cardiac monitor negative for A-fib Continue clopidogrel  75 mg daily and atorvastatin  80 mg daily for secondary stroke prevention managed/prescribed by PCP Discussed secondary stroke prevention measures and importance of close PCP follow up for aggressive stroke risk factor management including BP goal<130/90 and HLD with LDL goal<70  Bilateral hand and feet paresthesias:  A1c 6.9 04/2023 - previously in prediabetic range, now on treatment for diabetes, possibly contributing  Overall stable at this time Continue to monitor  Consider EMG/NCV if symptoms worsen  Facial numbness: Bilateral facial numbness from hairline to jaw, resolved by the following day, no associated pain or facial weakness Possibly in setting of URI with increased sinus congestion  Low suspicion stroke as no other neurological deficits/symptoms and affecting face bilaterally Continue to monitor for now, advised to call with any reoccurrence  Remote seizure history:  EEG 01/25/2021 unremarkable.  Not currently on ASMs.  No indication at this time.   Continue to monitor.    Follow up in 6 months or call earlier if needed    CC:  PCP: Ransom Byers, MD    I spent 45 minutes of face-to-face and non-face-to-face time with patient.  This included previsit chart review, lab review, study review, order entry, electronic health record documentation, patient education and discussion regarding above diagnoses and treatment plan and answered all the questions to patient's satisfaction  Johny Nap, AGNP-BC  [facility] [address]  [address] [place], [place] [address]  Phone [phone] Fax [phone] Note: This document was prepared with digital dictation and possible smart phrase technology. Any transcriptional errors that result  from this process are unintentional.

## 2023-11-16 ENCOUNTER — Telehealth: Payer: Self-pay | Admitting: Adult Health

## 2023-11-16 ENCOUNTER — Ambulatory Visit: Payer: Managed Care, Other (non HMO) | Admitting: Adult Health

## 2023-11-16 ENCOUNTER — Other Ambulatory Visit (HOSPITAL_COMMUNITY): Payer: Self-pay

## 2023-11-16 ENCOUNTER — Encounter: Payer: Self-pay | Admitting: Adult Health

## 2023-11-16 ENCOUNTER — Other Ambulatory Visit: Payer: Self-pay

## 2023-11-16 VITALS — BP 107/70 | HR 58 | Ht 66.0 in | Wt 218.0 lb

## 2023-11-16 DIAGNOSIS — G2581 Restless legs syndrome: Secondary | ICD-10-CM | POA: Diagnosis not present

## 2023-11-16 DIAGNOSIS — R29818 Other symptoms and signs involving the nervous system: Secondary | ICD-10-CM | POA: Diagnosis not present

## 2023-11-16 DIAGNOSIS — R4189 Other symptoms and signs involving cognitive functions and awareness: Secondary | ICD-10-CM | POA: Diagnosis not present

## 2023-11-16 DIAGNOSIS — I639 Cerebral infarction, unspecified: Secondary | ICD-10-CM

## 2023-11-16 DIAGNOSIS — G629 Polyneuropathy, unspecified: Secondary | ICD-10-CM

## 2023-11-16 MED ORDER — GABAPENTIN 100 MG PO CAPS
100.0000 mg | ORAL_CAPSULE | Freq: Every day | ORAL | 3 refills | Status: AC
  Filled 2023-11-16: qty 90, 90d supply, fill #0

## 2023-11-16 NOTE — Telephone Encounter (Signed)
Referral for sleep studies fax to [facility]. Phone: [phone], Fax: [phone].

## 2023-11-16 NOTE — Patient Instructions (Addendum)
 Your Plan:  Continue gabapentin  100mg  nightly as needed  Would recommend your iron levels be checked by your PCP at your next follow up visit   Continue plavix  and atorvastatin  for secondary stroke prevention   Referral placed to sleep center to evaluate for underlying sleep apnea      Follow up in 6-7 months or call earlier if needed       Thank you for coming to see us  at [facility]. I hope we have been able to provide you high quality care today.  You may receive a patient satisfaction survey over the next few weeks. We would appreciate your feedback and comments so that we may continue to improve ourselves and the health of our patients.

## 2023-11-19 ENCOUNTER — Other Ambulatory Visit (HOSPITAL_COMMUNITY): Payer: Self-pay

## 2023-11-20 ENCOUNTER — Other Ambulatory Visit (HOSPITAL_COMMUNITY): Payer: Self-pay

## 2023-11-20 MED ORDER — OZEMPIC (0.25 OR 0.5 MG/DOSE) 2 MG/3ML ~~LOC~~ SOPN
0.5000 mg | PEN_INJECTOR | SUBCUTANEOUS | 0 refills | Status: DC
  Filled 2023-11-20 (×2): qty 3, 28d supply, fill #0

## 2023-11-21 ENCOUNTER — Other Ambulatory Visit (HOSPITAL_COMMUNITY): Payer: Self-pay

## 2023-12-10 ENCOUNTER — Other Ambulatory Visit: Payer: Self-pay | Admitting: Adult Health

## 2023-12-10 ENCOUNTER — Other Ambulatory Visit (HOSPITAL_COMMUNITY): Payer: Self-pay

## 2023-12-10 DIAGNOSIS — I639 Cerebral infarction, unspecified: Secondary | ICD-10-CM

## 2023-12-11 ENCOUNTER — Other Ambulatory Visit (HOSPITAL_COMMUNITY): Payer: Self-pay

## 2023-12-12 ENCOUNTER — Other Ambulatory Visit (HOSPITAL_COMMUNITY): Payer: Self-pay

## 2023-12-12 ENCOUNTER — Other Ambulatory Visit: Payer: Self-pay

## 2023-12-12 MED ORDER — OZEMPIC (0.25 OR 0.5 MG/DOSE) 2 MG/3ML ~~LOC~~ SOPN
0.5000 mg | PEN_INJECTOR | SUBCUTANEOUS | 0 refills | Status: AC
  Filled 2023-12-12 (×2): qty 3, 28d supply, fill #0

## 2023-12-15 NOTE — Telephone Encounter (Signed)
 Per note on 11/16/23  Continue clopidogrel  75 mg daily and atorvastatin  80 mg daily for secondary stroke prevention managed/prescribed by PCP  Rx denied

## 2023-12-19 ENCOUNTER — Other Ambulatory Visit: Payer: Self-pay | Admitting: Adult Health

## 2023-12-19 DIAGNOSIS — I639 Cerebral infarction, unspecified: Secondary | ICD-10-CM

## 2023-12-20 ENCOUNTER — Other Ambulatory Visit (HOSPITAL_COMMUNITY): Payer: Self-pay

## 2023-12-28 ENCOUNTER — Other Ambulatory Visit: Payer: Self-pay

## 2023-12-28 ENCOUNTER — Other Ambulatory Visit (HOSPITAL_COMMUNITY): Payer: Self-pay

## 2023-12-29 ENCOUNTER — Other Ambulatory Visit (HOSPITAL_COMMUNITY): Payer: Self-pay

## 2023-12-29 ENCOUNTER — Other Ambulatory Visit: Payer: Self-pay

## 2023-12-29 ENCOUNTER — Encounter: Payer: Self-pay | Admitting: Adult Health

## 2023-12-29 DIAGNOSIS — I639 Cerebral infarction, unspecified: Secondary | ICD-10-CM

## 2023-12-29 MED ORDER — CLOPIDOGREL BISULFATE 75 MG PO TABS: 75.0000 mg | ORAL_TABLET | Freq: Every day | ORAL | 3 refills | Status: AC

## 2023-12-29 MED ORDER — CLOPIDOGREL BISULFATE 75 MG PO TABS
75.0000 mg | ORAL_TABLET | Freq: Every day | ORAL | 3 refills | Status: AC
  Filled 2023-12-29: qty 90, 90d supply, fill #0
  Filled 2024-03-24: qty 90, 90d supply, fill #1
  Filled 2024-07-07: qty 90, 90d supply, fill #2

## 2023-12-29 MED ORDER — OZEMPIC (1 MG/DOSE) 4 MG/3ML ~~LOC~~ SOPN
1.0000 mg | PEN_INJECTOR | SUBCUTANEOUS | 0 refills | Status: DC
  Filled 2023-12-29: qty 3, 28d supply, fill #0

## 2024-01-01 ENCOUNTER — Telehealth: Payer: Self-pay

## 2024-01-01 ENCOUNTER — Other Ambulatory Visit (HOSPITAL_COMMUNITY): Payer: Self-pay

## 2024-01-01 NOTE — Telephone Encounter (Signed)
 SABRA

## 2024-01-02 ENCOUNTER — Other Ambulatory Visit (HOSPITAL_COMMUNITY): Payer: Self-pay

## 2024-01-02 MED ORDER — OZEMPIC (1 MG/DOSE) 4 MG/3ML ~~LOC~~ SOPN
1.0000 mg | PEN_INJECTOR | SUBCUTANEOUS | 0 refills | Status: AC
  Filled 2024-01-02 – 2024-02-05 (×3): qty 3, 28d supply, fill #0

## 2024-01-03 ENCOUNTER — Other Ambulatory Visit (HOSPITAL_COMMUNITY): Payer: Self-pay

## 2024-01-04 ENCOUNTER — Other Ambulatory Visit: Payer: Self-pay | Admitting: Family Medicine

## 2024-01-04 DIAGNOSIS — R2241 Localized swelling, mass and lump, right lower limb: Secondary | ICD-10-CM

## 2024-01-05 ENCOUNTER — Other Ambulatory Visit (HOSPITAL_COMMUNITY): Payer: Self-pay

## 2024-01-10 ENCOUNTER — Ambulatory Visit
Admission: RE | Admit: 2024-01-10 | Discharge: 2024-01-10 | Disposition: A | Discharge status: Home / Self Care | Source: Ambulatory Visit | Attending: Family Medicine | Admitting: Family Medicine

## 2024-01-10 DIAGNOSIS — R2241 Localized swelling, mass and lump, right lower limb: Secondary | ICD-10-CM

## 2024-01-17 ENCOUNTER — Other Ambulatory Visit

## 2024-01-18 ENCOUNTER — Emergency Department (HOSPITAL_BASED_OUTPATIENT_CLINIC_OR_DEPARTMENT_OTHER): Admitting: Radiology

## 2024-01-18 ENCOUNTER — Emergency Department (HOSPITAL_BASED_OUTPATIENT_CLINIC_OR_DEPARTMENT_OTHER)

## 2024-01-18 ENCOUNTER — Other Ambulatory Visit: Payer: Self-pay

## 2024-01-18 ENCOUNTER — Encounter (HOSPITAL_BASED_OUTPATIENT_CLINIC_OR_DEPARTMENT_OTHER): Payer: Self-pay

## 2024-01-18 ENCOUNTER — Emergency Department (HOSPITAL_BASED_OUTPATIENT_CLINIC_OR_DEPARTMENT_OTHER)
Admission: EM | Admit: 2024-01-18 | Discharge: 2024-01-18 | Disposition: A | Discharge status: Home / Self Care | Attending: Emergency Medicine | Admitting: Emergency Medicine

## 2024-01-18 DIAGNOSIS — S0083XA Contusion of other part of head, initial encounter: Secondary | ICD-10-CM | POA: Diagnosis not present

## 2024-01-18 DIAGNOSIS — I10 Essential (primary) hypertension: Secondary | ICD-10-CM | POA: Diagnosis not present

## 2024-01-18 DIAGNOSIS — R110 Nausea: Secondary | ICD-10-CM

## 2024-01-18 DIAGNOSIS — R197 Diarrhea, unspecified: Secondary | ICD-10-CM | POA: Insufficient documentation

## 2024-01-18 DIAGNOSIS — W228XXA Striking against or struck by other objects, initial encounter: Secondary | ICD-10-CM | POA: Diagnosis not present

## 2024-01-18 DIAGNOSIS — Z79899 Other long term (current) drug therapy: Secondary | ICD-10-CM | POA: Insufficient documentation

## 2024-01-18 DIAGNOSIS — T148XXA Other injury of unspecified body region, initial encounter: Secondary | ICD-10-CM

## 2024-01-18 DIAGNOSIS — S161XXA Strain of muscle, fascia and tendon at neck level, initial encounter: Secondary | ICD-10-CM | POA: Diagnosis not present

## 2024-01-18 DIAGNOSIS — R55 Syncope and collapse: Secondary | ICD-10-CM | POA: Insufficient documentation

## 2024-01-18 DIAGNOSIS — Z794 Long term (current) use of insulin: Secondary | ICD-10-CM | POA: Diagnosis not present

## 2024-01-18 DIAGNOSIS — S199XXA Unspecified injury of neck, initial encounter: Secondary | ICD-10-CM | POA: Diagnosis present

## 2024-01-18 LAB — COMPREHENSIVE METABOLIC PANEL WITH GFR
ALT: 60 U/L — ABNORMAL HIGH (ref 0–44)
AST: 38 U/L (ref 15–41)
Albumin: 4.4 g/dL (ref 3.5–5.0)
Alkaline Phosphatase: 61 U/L (ref 38–126)
Anion gap: 17 — ABNORMAL HIGH (ref 5–15)
BUN: 19 mg/dL (ref 6–20)
CO2: 19 mmol/L — ABNORMAL LOW (ref 22–32)
Calcium: 8.6 mg/dL — ABNORMAL LOW (ref 8.9–10.3)
Chloride: 101 mmol/L (ref 98–111)
Creatinine, Ser: 0.71 mg/dL (ref 0.44–1.00)
GFR, Estimated: >60 mL/min (ref >60)
Glucose, Bld: 180 mg/dL — ABNORMAL HIGH (ref 70–99)
Potassium: 3.8 mmol/L (ref 3.5–5.1)
Sodium: 136 mmol/L (ref 135–145)
Total Bilirubin: 1.1 mg/dL (ref 0.0–1.2)
Total Protein: 7.3 g/dL (ref 6.5–8.1)

## 2024-01-18 LAB — URINALYSIS, ROUTINE W REFLEX MICROSCOPIC
Bilirubin Urine: NEGATIVE
Glucose, UA: NEGATIVE mg/dL
Hgb urine dipstick: NEGATIVE
Ketones, ur: NEGATIVE mg/dL
Leukocytes,Ua: NEGATIVE
Nitrite: NEGATIVE
Specific Gravity, Urine: 1.03 (ref 1.005–1.030)
pH: 5.5 (ref 5.0–8.0)

## 2024-01-18 LAB — CBC
HCT: 42.5 % (ref 36.0–46.0)
Hemoglobin: 14.6 g/dL (ref 12.0–15.0)
MCH: 30.9 pg (ref 26.0–34.0)
MCHC: 34.4 g/dL (ref 30.0–36.0)
MCV: 90 fL (ref 80.0–100.0)
Platelets: 264 K/uL (ref 150–400)
RBC: 4.72 MIL/uL (ref 3.87–5.11)
RDW: 12.8 % (ref 11.5–15.5)
WBC: 7 K/uL (ref 4.0–10.5)
nRBC: 0 % (ref 0.0–0.2)

## 2024-01-18 LAB — TROPONIN T, HIGH SENSITIVITY: Troponin T High Sensitivity: <15 ng/L (ref <19)

## 2024-01-18 MED ORDER — ONDANSETRON 4 MG PO TBDP
4.0000 mg | ORAL_TABLET | Freq: Three times a day (TID) | ORAL | 0 refills | Status: AC | PRN
  Filled 2024-01-18: qty 9, 21d supply, fill #0

## 2024-01-18 MED ORDER — LACTATED RINGERS IV BOLUS
1000.0000 mL | Freq: Once | INTRAVENOUS | Status: AC
  Administered 2024-01-18: 1000 mL via INTRAVENOUS

## 2024-01-18 NOTE — ED Triage Notes (Signed)
 Pt c/o NV yesterday, last night she went up to go to bathroom, felt nauseous & then woke up on the marble floor. No idea how long I was out. Advises top of head, base of neck, general HA. Advises increase in Ozympic approx 3wks ago  Called both PCP & neuro, sent to UC & then to ED for eval

## 2024-01-18 NOTE — ED Provider Notes (Signed)
 [place] EMERGENCY DEPARTMENT AT [facility] Provider Note   CSN: [id number] Arrival date & time: 01/18/24  1352     Patient presents with: Dizziness and Loss of Consciousness (Last night)   Sarah Simpson is a 58 y.o. female.  {Add pertinent medical, surgical, social history, OB history to HPI:32947} HPI     58 year old female with a history of restless leg syndrome, depression, ADD, possible seizures, who presents with concern for syncope.   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  Just taking one at night was on 2 a day before, does not remember dose she was on for seizures.  Sees neurologist due to strokes but not treating for seizures.  Had diarrhea  On ozempic  and just increased dose to 1mg  3 weeks ago, had not had nausea but has had some diarrhea with that but yesterday was severe.  Yesterday might have gone 4-5 times. No black or bloody stools. Nausea, no vomiting.  No abdominal pain now Having some neck pain Last night acid reflux symptoms Denies numbness, weakness, difficulty talking or walking, visual changes or facial droop.     Past Medical History:  Diagnosis Date   ADD (attention deficit disorder)    Allergy    Anemia    Arthritis    OA back    Cyst of right ovary    Depression    Heart murmur    History of kidney stones    Hypertension    Neuromuscular disorder (HCC)    RLS   Restless leg syndrome    Seizures (HCC)    in her 20's- none since-?  etiology      Prior to Admission medications   Medication Sig Start Date End Date Taking? Authorizing Provider  acetaminophen  (TYLENOL ) 325 MG tablet Take 650 mg by mouth as needed for moderate pain or headache.    [provider]  atorvastatin  (LIPITOR ) 80 MG tablet Take 1 tablet (80 mg total) by mouth daily at 6 P.M. 10/05/23     clopidogrel  (PLAVIX ) 75 MG tablet Take 1 tablet by mouth daily. 12/29/23   Whitfield Raisin, NP  clopidogrel  (PLAVIX ) 75 MG tablet Take 1 tablet (75 mg total) by mouth daily. 12/29/23     escitalopram  (LEXAPRO ) 10 MG tablet Take 1 tablet (10 mg total) by mouth daily. 09/04/23     gabapentin  (NEURONTIN ) 100 MG capsule Take 1 capsule (100 mg total) by mouth at bedtime. 11/16/23   Whitfield Raisin, NP  losartan  (COZAAR ) 25 MG tablet Take 1 tablet (25 mg total) by mouth daily. 10/05/23     Melatonin 10 MG TABS Take 10 mg by mouth at bedtime.    [provider]  metFORMIN  (GLUCOPHAGE -XR) 500 MG 24 hr tablet Take 1 tablet by mouth once daily with dinner for one week, then take 1 tablet twice a day thereafter. 05/11/23     Multiple Vitamin (MULTIVITAMIN) tablet Take 1 tablet by mouth  daily.    [provider]  Semaglutide , 1 MG/DOSE, (OZEMPIC , 1 MG/DOSE,) 4 MG/3ML SOPN Inject 1 mg into the skin once a week. 01/02/24     Semaglutide ,0.25 or 0.5MG /DOS, (OZEMPIC , 0.25 OR 0.5 MG/DOSE,) 2 MG/3ML SOPN Inject 0.5 mg into the skin once a week. 12/11/23     valACYclovir  (VALTREX ) 500 MG tablet Take 0.5 tablet by mouth daily. 05/10/23       Allergies: Ciprofloxacin , Levaquin  [levofloxacin ], Amoxil  [amoxicillin ], Dynacin [minocycline], and Sulfamethazine    Review of Systems  Updated Vital Signs BP 103/61   Pulse 66   Temp 98.8 F (37.1 C) (Oral)   Resp 10   SpO2 96%   Physical Exam  (all labs ordered are listed, but only abnormal results are displayed) Labs Reviewed  COMPREHENSIVE METABOLIC PANEL WITH GFR - Abnormal; Notable for the following components:       Result Value   CO2 19 (*)    Glucose, Bld 180 (*)    Calcium  8.6 (*)    ALT 60 (*)    Anion gap 17 (*)    All other components within normal limits  URINALYSIS, ROUTINE W REFLEX MICROSCOPIC - Abnormal; Notable for the following components:   Protein, ur TRACE (*)    All other components within normal limits  CBC  CBG MONITORING, ED  TROPONIN T, HIGH SENSITIVITY    EKG: EKG Interpretation Date/Time:  Thursday January 18 2024 14:01:41 EDT Ventricular Rate:  93 PR Interval:  169 QRS Duration:  96 QT Interval:  353 QTC Calculation: 439 R Axis:   28  Text Interpretation: Sinus rhythm Consider right atrial enlargement Low voltage, precordial leads Probable anteroseptal infarct, old Confirmed by Ruthe Cornet [id number]) on 01/18/2024 2:03:38 PM  Radiology: CT HEAD WO CONTRAST Result Date: 01/18/2024 CLINICAL DATA:  Syncope, hit head, hematoma to right forehead. EXAM: CT HEAD WITHOUT CONTRAST TECHNIQUE: Contiguous axial images were obtained from the base of the skull through the vertex without intravenous contrast. RADIATION DOSE REDUCTION: This exam was performed according to the departmental dose-optimization program which includes automated exposure control, adjustment of the mA and/or kV according to patient size and/or use of iterative reconstruction technique. COMPARISON:  CT head and MRI head 11/29/2020. FINDINGS: Brain: No acute intracranial hemorrhage. No CT evidence of acute infarct. No edema, mass effect, or midline shift. The basilar cisterns are patent. Ventricles: The ventricles are normal. Vascular: No hyperdense vessel or unexpected calcification. Skull: No acute or aggressive finding. Orbits: Orbits are symmetric. Sinuses: The visualized paranasal sinuses are clear. Other: Right forehead hematoma extending over the supraorbital ridge with mild soft tissue swelling in the preseptal soft tissues along the superior aspect of the right orbit. Mastoid air cells are clear. IMPRESSION:  No CT evidence of acute intracranial abnormality. Right forehead hematoma with soft tissue swelling extending over the supraorbital ridge and into the preseptal soft tissues of the right orbit. Electronically Signed   By: Donnice Mania M.D.   On: 01/18/2024 14:50   DG Chest 2 View Result Date: 01/18/2024 CLINICAL DATA:  Loss of consciousness. EXAM: CHEST - 2 VIEW COMPARISON:  Chest radiograph dated 04/22/2023. FINDINGS: The heart size and mediastinal contours are within normal limits. Both lungs are clear. The visualized skeletal structures are unremarkable. IMPRESSION: No active cardiopulmonary disease. Electronically Signed   By: Vanetta Chou M.D.   On: 01/18/2024 14:24    {Document cardiac monitor, telemetry assessment procedure when appropriate:32947} Procedures   Medications Ordered in the ED - No data  to display    {Click here for ABCD2, HEART and other calculators REFRESH Note before signing:1}                              Medical Decision Making Amount and/or Complexity of Data Reviewed Labs: ordered. Radiology: ordered.   ***   Labs completed and personally eval and interpreted by me show no evidence of UTI.  No evidence of anemia or leukocytosis.  CMP with mild anion gap metabolic acidosis.  Troponin within normal limits  CT head completed showing no evidence of acute intracranial abnormality.  X-ray of the chest evaluated by me and shows no evidence of acute disease.   {Document critical care time when appropriate  Document review of labs and clinical decision tools ie [id number], etc  Document your independent review of radiology images and any outside records  Document your discussion with family members, caretakers and with consultants  Document social determinants of health affecting pt's care  Document your decision making why or why not admission, treatments were needed:32947:::1}   Final diagnoses:  None    ED Discharge Orders     None

## 2024-01-18 NOTE — ED Notes (Signed)
 RN reviewed discharge instructions with pt. Pt verbalized understanding and had no further questions. VSS upon discharge.

## 2024-01-19 ENCOUNTER — Other Ambulatory Visit (HOSPITAL_COMMUNITY): Payer: Self-pay

## 2024-01-19 ENCOUNTER — Other Ambulatory Visit: Payer: Self-pay

## 2024-01-28 ENCOUNTER — Other Ambulatory Visit (HOSPITAL_BASED_OUTPATIENT_CLINIC_OR_DEPARTMENT_OTHER): Payer: Self-pay

## 2024-01-29 ENCOUNTER — Other Ambulatory Visit (HOSPITAL_COMMUNITY): Payer: Self-pay

## 2024-01-29 ENCOUNTER — Other Ambulatory Visit: Payer: Self-pay

## 2024-01-31 ENCOUNTER — Ambulatory Visit: Payer: Self-pay | Admitting: Surgery

## 2024-02-01 ENCOUNTER — Encounter: Payer: Self-pay | Admitting: Neurology

## 2024-02-01 ENCOUNTER — Telehealth: Payer: Self-pay | Admitting: Neurology

## 2024-02-01 NOTE — Telephone Encounter (Signed)
 Received a notification from [facility] indicating surgical clearance needed. Letter created and placed on Sarah Simpson's desk,

## 2024-02-04 ENCOUNTER — Other Ambulatory Visit (HOSPITAL_COMMUNITY): Payer: Self-pay

## 2024-02-05 ENCOUNTER — Other Ambulatory Visit (HOSPITAL_COMMUNITY): Payer: Self-pay

## 2024-02-12 ENCOUNTER — Other Ambulatory Visit: Payer: Self-pay

## 2024-02-12 ENCOUNTER — Other Ambulatory Visit (HOSPITAL_COMMUNITY): Payer: Self-pay

## 2024-02-12 MED ORDER — OZEMPIC (2 MG/DOSE) 8 MG/3ML ~~LOC~~ SOPN
2.0000 mg | PEN_INJECTOR | SUBCUTANEOUS | 0 refills | Status: DC
  Filled 2024-02-12: qty 3, 28d supply, fill #0

## 2024-03-07 ENCOUNTER — Other Ambulatory Visit (HOSPITAL_COMMUNITY): Payer: Self-pay

## 2024-03-18 ENCOUNTER — Other Ambulatory Visit (HOSPITAL_COMMUNITY): Payer: Self-pay

## 2024-03-18 MED ORDER — VALACYCLOVIR HCL 1 G PO TABS
1000.0000 mg | ORAL_TABLET | Freq: Three times a day (TID) | ORAL | 0 refills | Status: AC
  Filled 2024-03-18: qty 21, 7d supply, fill #0

## 2024-03-24 ENCOUNTER — Other Ambulatory Visit (HOSPITAL_COMMUNITY): Payer: Self-pay

## 2024-03-25 ENCOUNTER — Other Ambulatory Visit (HOSPITAL_COMMUNITY): Payer: Self-pay

## 2024-03-31 ENCOUNTER — Other Ambulatory Visit (HOSPITAL_COMMUNITY): Payer: Self-pay

## 2024-04-02 ENCOUNTER — Other Ambulatory Visit (HOSPITAL_COMMUNITY): Payer: Self-pay

## 2024-04-02 MED ORDER — OZEMPIC (2 MG/DOSE) 8 MG/3ML ~~LOC~~ SOPN
2.0000 mg | PEN_INJECTOR | SUBCUTANEOUS | 0 refills | Status: DC
  Filled 2024-04-02: qty 3, 28d supply, fill #0

## 2024-04-16 ENCOUNTER — Other Ambulatory Visit (HOSPITAL_COMMUNITY): Payer: Self-pay

## 2024-04-16 ENCOUNTER — Other Ambulatory Visit: Payer: Self-pay

## 2024-04-28 ENCOUNTER — Other Ambulatory Visit (HOSPITAL_COMMUNITY): Payer: Self-pay

## 2024-04-29 ENCOUNTER — Other Ambulatory Visit (HOSPITAL_COMMUNITY): Payer: Self-pay

## 2024-04-30 ENCOUNTER — Other Ambulatory Visit: Payer: Self-pay

## 2024-04-30 ENCOUNTER — Other Ambulatory Visit (HOSPITAL_COMMUNITY): Payer: Self-pay

## 2024-04-30 MED ORDER — OZEMPIC (2 MG/DOSE) 8 MG/3ML ~~LOC~~ SOPN
2.0000 mg | PEN_INJECTOR | SUBCUTANEOUS | 0 refills | Status: DC
  Filled 2024-04-30: qty 3, 28d supply, fill #0

## 2024-05-28 ENCOUNTER — Other Ambulatory Visit (HOSPITAL_COMMUNITY): Payer: Self-pay

## 2024-05-29 ENCOUNTER — Encounter: Payer: Self-pay | Admitting: Adult Health

## 2024-05-29 ENCOUNTER — Other Ambulatory Visit (HOSPITAL_COMMUNITY): Payer: Self-pay

## 2024-05-29 ENCOUNTER — Ambulatory Visit: Admitting: Adult Health

## 2024-05-29 VITALS — BP 112/72 | HR 76 | Ht 66.0 in | Wt 214.8 lb

## 2024-05-29 DIAGNOSIS — G4733 Obstructive sleep apnea (adult) (pediatric): Secondary | ICD-10-CM | POA: Diagnosis not present

## 2024-05-29 DIAGNOSIS — R4189 Other symptoms and signs involving cognitive functions and awareness: Secondary | ICD-10-CM

## 2024-05-29 DIAGNOSIS — G629 Polyneuropathy, unspecified: Secondary | ICD-10-CM | POA: Diagnosis not present

## 2024-05-29 DIAGNOSIS — G44209 Tension-type headache, unspecified, not intractable: Secondary | ICD-10-CM

## 2024-05-29 DIAGNOSIS — G43709 Chronic migraine without aura, not intractable, without status migrainosus: Secondary | ICD-10-CM | POA: Diagnosis not present

## 2024-05-29 DIAGNOSIS — I639 Cerebral infarction, unspecified: Secondary | ICD-10-CM

## 2024-05-29 MED ORDER — NURTEC 75 MG PO TBDP
75.0000 mg | ORAL_TABLET | ORAL | 11 refills | Status: AC
  Filled 2024-05-29: qty 16, 32d supply, fill #0
  Filled 2024-07-16: qty 16, 32d supply, fill #1

## 2024-05-29 NOTE — Progress Notes (Addendum)
 " [name] [address] [place]. [place] [address] [phone]       STROKE FOLLOW UP NOTE  [name] Date of Birth:  [birth year] Medical Record Number:  [id number]   Reason for Referral: stroke follow up    SUBJECTIVE:   CHIEF COMPLAINT:  Chief Complaint  Patient presents with   RM 8     Patient is here alone for a stroke follow-up - has been getting more frequent headaches. She had shingles in October.  Would like to talk about stopping Gabapentin  due to its connection to dementia      HPI:   05/29/2024 JM: patient returns for follow up visit.  She does mention onset of bilateral frontal/temporal headaches over the past 3 to 4 months that occur about 3-4 times per week.  Will take Tylenol  and usually will have to repeat dose after couple hours.  At times, headaches will continue into the following day.  Typically occur mid afternoon.  No changes in vision or other new neurological symptoms, denies photophobia, phonophobia or nausea/vomiting.  She was diagnosed with shingles in October that was present on her right torso, resolved after higher dose of Valtrex  which she takes chronically.  Feels cognition overall stable, overall improved after medication adjustments previously.  She has continued on gabapentin  100 mg nightly for poststroke pain but questions discontinuing due to concern of developing dementia with prolonged use.  No new stroke/TIA symptoms.  Remains on Plavix  and atorvastatin  without side effects.  Routinely follows with PCP for stroke risk factor management.  She was evaluated by [facility] with repeat sleep study which showed evidence of sleep apnea and was set up with oral appliance. Has not had repeat sleep study since obtaining.  No further questions or concerns at this time.      History provided for reference purposes only Update 11/16/2023 JM: Patient returns for 29-month follow-up visit.  At prior visit, gradually  tapered off topiramate  and decreased gabapentin  dosage due to complaints of worsening cognition.    After prior visit, patient sent MyChart message with concerns of continued cognitive difficulties and was even placed on improvement plan in 05/2023.  She was referred to neuropsychology for further testing as MOCA in office essentially normal.  Underwent neurocognitive testing 3/26 by Dr. [name] noted testing grossly within expectations, some variability with regards to working memory but this was mild and did not rise to level of cognitive diagnosis and possibly related to pre-existing ADHD, depression, sleep disturbance and vitamin D  deficiency.  Recommended consideration of treatment for ADHD such as Strattera and consider sleep study to assess for sleep apnea or other sleep disorders.   She does note some improvement of cognitive difficulties after reducing gabapentin  and eliminating topiramate  and nightly Benadryl  and thankfully removed from improvement plan.  She does still struggle occasionally with short-term memory such as misplacing her coffee cup at work, frequently forgets to clock out at work, will go to leave and realize she left her car keys in her office.  Reports prior diagnosis of ADHD but had difficulty tolerating prior stimulants as this caused worsening insomnia.  She has been taking gabapentin  100 mg nightly more so for RLS symptoms which is helpful.  She does report history of iron deficiency and on iron supplement as a child, does not believe she had recent iron levels checked.  Reports prior sleep study over 15 years ago, she is interested in having this reevaluated.  No new stroke/TIA symptoms.  She has noted some improvement of right sided dysesthesias and only occurring occasionally. Reports compliance on Plavix  and atorvastatin .  Routinely follows with PCP Dr. [name] for stroke risk factor management.  Lab work after prior visit showed slightly low vitamin D  and A1c at 6.9,  she was advised to follow-up with PCP for further treatment recommendations.  B12 and thyroid  panel largely within normal limits.  She was started on metformin  in 04/2023, repeat A1C 6.7 and started on semaglutide  08/2023. Has been working on dietary changes and trying to stay active.  She has follow-up visit with PCP next month for repeat lab work.  Update 05/03/2023 JM: Patient returns for follow-up visit.  Previously seen by [name], NP 6 months ago, reported recent falls due to tripping and continued right sided dysesthesias, topiramate  dosage increased to 75 mg twice daily.   She did experience facial numbness bilaterally on 11/2, due to stroke history, she was concerned of recurrent stroke therefore proceeded [facility] ED [facility].  Low suspicion for stroke related as numbness affected entire face from jawline to hairline and no other associated symptoms.  Did complain of URI symptoms, CXR c/w atypical vs viral infection and discharged home with PCP follow-up.  Numbness resolved by the following day without any reoccurrence.  Continued right sided dysesthesias, can interfere with activity at times, increased dose of topiramate  without any benefit.  Continued bilateral hand and foot numbness, able without worsening.  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 She does sleep poorly, started taking a friends prescription of gabapentin  100 mg nightly with helps with sleep and neuropathy, previously on gabapentin  300mg  but due to fatigue, was discontinued.  She has been tolerating lower dose well.  She also takes melatonin and Benadryl  to assist with sleep.  Denies new stroke/TIA symptoms.  Compliant on Plavix  and atorvastatin .  Routinely follows with PCP for stroke  risk factor management.   Update 10/26/2022 MM: Sarah Simpson is a 58 y.o. female who has been followed in this office for Stroke and neuropathy. Returns today for follow-up.  Patient denies any strokelike symptoms.  She remains on Plavix .  Follows closely with her primary care for stroke risk factor management.  Reports that she has had 3 falls due to tripping.  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  Often times she is looking backwards or talking to the clients. continues to have dyskinesias on the right side post stroke.  She remains  Topamax  50 mg twice a day. Couldn't tolerate gabapentin  due to drowsiness   Update 05/25/2022 JM: Patient returns for 64-month stroke follow-up.  Overall stable without new stroke/TIA symptoms.  Reports residual right-sided dysesthesias, stable.  Remains on topiramate  50 mg twice daily for poststroke pain and chronic neuropathy.  She continues to experience nerve pain and numbness especially in the morning, tolerable but can interfere with daily activity.  Denies any worsening since prior visit.  She has also been having a flareup of sciatic pain, believes she possibly twisted wrong when getting up Christmas decorations.  She was evaluated by our sleep specialist Dr. Buck back in June but has not yet proceeded with sleep study due to financial reasons.   Completed cardiac monitor which was  negative for A-fib, referral placed to EP for ILR evaluation but was unable to be contacted to schedule. She plans on reaching out to her insurance to check on price and proceed if able at that time  Remains on Plavix  and atorvastatin  Blood pressure well controlled   Update 11/17/2021 JM: Patient returns for stroke follow-up after prior visit 6 months ago.  She has been stable from stroke standpoint without new stroke/TIA symptoms.  Reports residual right sided dysthesia's which have been stable. Will also have occasional  bilateral hand and feet neuropathy which is chronic and can fluctuate but does believe it has slightly worsened over the past few months.  She continues to work at [employer] where she walks majority of the day. Neuropathy does not specifically worsen with any activity or movement. Neuropathy is not debilitating.  She has remained on topiramate  25 mg twice daily, denies side effects.  She questions possibly increasing dosage.  Does have chronic issues with sleeping, uses OTC sleeping aides. She has had prior sleep study likely more than 10 years ago (unable to view via epic), does not believe it showed sleep apnea but did show restless leg syndrome.  She will have occasional difficulty with RLS at night and what sounds like PLMD.  Does have day time fatigue, snores, insomnia, and nightly nocturia.   Was previously on Plavix  but had difficulty obtaining refill therefore she has since been on aspirin  81 mg daily, denies side effects.  Compliant on atorvastatin , denies side effects.  Blood pressure today 119/77.    No further concerns at this time.   Update 05/20/2021 JM: Returns for stroke follow-up after prior visit with Dr. Rosemarie approx 5 mo ago for strokelike episode s/p tPA on 11/28/2020 and prior stroke 09/15/2020.  She has been stable from stroke standpoint without new stroke/TIA symptoms.  At prior visit, was switched from gabapentin  to topiramate  for poststroke paresthesias with significant improvement of tingling. Occasional twinge but doesn't last long. She had not had any additional severe headaches. Occasional mild headaches. EEG completed on 01/25/2021 to rule out seizures with remote history which was unremarkable. Reports continued poor vision chronically but worsened post stroke -previously discussed evaluation by ophthalmology which she now wishes to pursue as she is insured.  She is working at [employer] in [place].  Remains on Plavix  and atorvastatin  -denies side effects.   Blood pressure today 122/84 on losartan  25 mg daily.  No further concerns at this time   Update 12/14/2020 Dr. Rosemarie: She is seen today for follow-up after recent hospital admission from 11/28/2020 for strokelike symptoms.  She presented with sudden onset of right-sided weakness and numbness.  CT head was unremarkable CT angiogram of the head and neck did not show significant large vessel stenosis.  She received IV tPA and did well.  An MRI scan of the brain was obtained which was negative for acute stroke.  LDL cholesterol was 39 mg percent hemoglobin A1c was 5.7.  Echocardiogram so not repeated as she had one in April 2022 TEE which was normal.  She had an EEG done this admission which was negative for seizure activity.  Patient physical exam was consistent with functional components including lack of activation of the right cheek when asked to smile but she was able to puff her cheeks symmetrically or blow air with these and activated facial muscles when talking.  She was felt to have strokelike episode secondary to conversion reaction versus atypical migraine.  Patient states  she is done well since discharge.  She has increase her gabapentin  to 300 mg in the morning, afternoon and 600 at night but see still not happy and is having right-sided facial paresthesias.  Headaches seem under better control.  She is on aspirin  and Plavix  tolerating them well with minor bruising and no bleeding.  Blood pressures well controlled.  She is tolerating Lipitor  well without muscle aches and pains.   Initial visit 10/22/2020 JM: Ms. Bean is being seen for hospital follow-up accompanied by her friend, Richard.  Reports residual blurred vision with reading and writing. Reports some visual impairment prior to her stroke having to use reading glasses but has had greater difficulty with reading even with using her reading glasses since her stroke.  She has returned back to almost all prior activities.  She has not worked  since the onset of COVID.  She is currently uninsured and in the process of applying for [employer] assistance program.  She also reports almost daily right sided headaches which have been present since her stroke.  She does have history of migraines but has not experienced any migraines since 01/2020 - these headaches are not consistent with her known migraines.  Reports continued constant bilateral hand and feet numbness/tingling.  Has been experiencing the symptoms for years but typically come and go but since her stroke they have been constant.  She will occasionally experience painful sensation.  She was prescribed gabapentin  100 mg 3 times daily at hospital discharge but she has been tolerating but does not believe it has been beneficial  She has remained on DAPT despite 3-week recommendation but denies bleeding or bruising Remains on atorvastatin  80 mg daily without myalgias Blood pressure today 125/83  No further concerns at this time   Stroke admission 09/15/2020 58 year old female with a history of hypertension, seizure like episodes in her 13s, heart murmur, migraine headaches,, tobacco use, obesity, chronic parasthesias bilat hands, depression, RLS, ADD who presented on 09/15/2020 with facial droop and speech difficulties that started 3/28.  Personally reviewed hospitalization pertinent progress notes, lab work and imaging with summary provided.  Evaluated by Dr. Rosemarie with stroke work-up revealing acute subcortical infarct in the region of the caudate tail on the left due to large cortical infarct likely of cryptogenic etiology.  Recommended DAPT for 3 weeks and aspirin  alone.  TEE negative for PFO or cardiac source of embolism identified.  Hypercoagulable labs negative. Loop recorder not placed prior to d/c. Hormonal therapy for postmenopausal hot flashes discontinued.  LDL 133 -recommend initiating atorvastatin  80 mg daily.  Complaints of bilateral paresthesias/headaches/rashes unclear  etiology with brain and C-spine imaging no acute findings to explain her symptoms.  Vasculitis labs negative.  No prior stroke history.  Acute subcortical infarct in the region of the caudate tail on the left due to large subcortical infarct likely of cryptogenic etiology   CT Code Stroke: A small age-indeterminate lacunar infarct is questioned within the left pons. There is no acute intracranial hemorrhage. MRI brain Diffusion abnormality in the caudate tail on the left most consistent with stroke. MRA head: Truncated examination. Small acute infarct of the left caudate tail. Normal intracranial MRA. Carotid Doppler Nearly normal bilaterally, no high grade stenosis 2D Echo EF 60-65%, No thrombus, no wall motion abnormality or shunt found.  Bilat LE Doppler no evidence of DVT Hypercoagulable labs negative TEE EF 60 to 65%.  Negative for PFO.  No evidence of cardiac source of embolism identified LDL 133 HgbA1c 5.7 No  antiplatelet or anticoagulant medications prior to admission Recommend DAPT with ASA 81mg  and Plavix  75mg  x 3 weeks then ASA 81mg  monotherapy Therapy recommendations:  none Disposition:   Home on 09/18/2020       ROS:   14 system review of systems performed and negative with exception of those listed in HPI  PMH:  Past Medical History:  Diagnosis Date   ADD (attention deficit disorder)    Allergy    Anemia    Arthritis    OA back    Cyst of right ovary    Depression    Heart murmur    History of kidney stones    Hypertension    Neuromuscular disorder (HCC)    RLS   Restless leg syndrome    Seizures (HCC)    in her 20's- none since-? etiology     PSH:  Past Surgical History:  Procedure Laterality Date   APPENDECTOMY  2008   BUBBLE STUDY  09/18/2020   Procedure: BUBBLE STUDY;  Surgeon: Okey Vina GAILS, MD;  Location[facility] ENDOSCOPY;  Service: Cardiovascular;;   BUNIONECTOMY  2009   x 2   TEE WITHOUT CARDIOVERSION N/A 09/18/2020   Procedure: TRANSESOPHAGEAL  ECHOCARDIOGRAM (TEE);  Surgeon: Okey Vina GAILS, MD;  Location: [facility] ENDOSCOPY;  Service: Cardiovascular;  Laterality: N/A;   WISDOM TOOTH EXTRACTION      Social History:  Social History   Socioeconomic History   Marital status: Single    Spouse name: Not on file   Number of children: Not on file   Years of education: Not on file   Highest education level: Not on file  Occupational History   Not on file  Tobacco Use   Smoking status: Former   Smokeless tobacco: Never   Tobacco comments:    on and off in the past, quit in 2002  Vaping Use   Vaping status: Never Used  Substance and Sexual Activity   Alcohol use: Yes    Comment: occ   Drug use: Never   Sexual activity: Not on file  Other Topics Concern   Not on file  Social History Narrative   Work or School: tax adviser - [employer]Home Situation: noneSpiritual Beliefs: ChristianLifestyle: no regular exercise; so so      1-2 cup of coffee weekly and 1-2 cups of tea daily    Social Drivers of Corporate Investment Banker Strain: Not on file  Food Insecurity: Not on file  Transportation Needs: Not on file  Physical Activity: Not on file  Stress: Not on file  Social Connections: Not on file  Intimate Partner Violence: Not on file    Family History:  Family History  Problem Relation Age of Onset   Colon cancer Mother 32   Heart attack Mother    Dementia Mother    Colon cancer Father 24   Colon polyps Neg Hx    Esophageal cancer Neg Hx    Stomach cancer Neg Hx    Rectal cancer Neg Hx    Sleep apnea Neg Hx     Medications:   Current Outpatient Medications on File Prior to Visit  Medication Sig Dispense Refill   acetaminophen  (TYLENOL ) 325 MG tablet Take 650 mg by mouth as needed for moderate pain or headache.     atorvastatin  (LIPITOR ) 80 MG tablet Take 1 tablet (80 mg total) by mouth daily at 6 P.M. 90 tablet 3   clopidogrel  (PLAVIX ) 75 MG tablet Take 1 tablet by mouth daily. 90  tablet 3   clopidogrel  (PLAVIX ) 75 MG  tablet Take 1 tablet (75 mg total) by mouth daily. 90 tablet 3   escitalopram  (LEXAPRO ) 10 MG tablet Take 1 tablet (10 mg total) by mouth daily. 90 tablet 3   gabapentin  (NEURONTIN ) 100 MG capsule Take 1 capsule (100 mg total) by mouth at bedtime. 90 capsule 3   losartan  (COZAAR ) 25 MG tablet Take 1 tablet (25 mg total) by mouth daily. 90 tablet 3   Melatonin 10 MG TABS Take 10 mg by mouth at bedtime.     metFORMIN  (GLUCOPHAGE -XR) 500 MG 24 hr tablet Take 1 tablet by mouth once daily with dinner for one week, then take 1 tablet twice a day thereafter. 180 tablet 3   Multiple Vitamin (MULTIVITAMIN) tablet Take 1 tablet by mouth daily.     Semaglutide , 2 MG/DOSE, (OZEMPIC , 2 MG/DOSE,) 8 MG/3ML SOPN Inject 2 mg into the skin once a week. 3 mL 0   valACYclovir  (VALTREX ) 500 MG tablet Take 0.5 tablet by mouth daily. 90 tablet 3   ondansetron  (ZOFRAN -ODT) 4 MG disintegrating tablet Take 1 tablet (4 mg total) by mouth every 8 (eight) hours as needed for nausea or vomiting. 20 tablet 0   Semaglutide , 1 MG/DOSE, (OZEMPIC , 1 MG/DOSE,) 4 MG/3ML SOPN Inject 1 mg into the skin once a week. 3 mL 0   Semaglutide ,0.25 or 0.5MG /DOS, (OZEMPIC , 0.25 OR 0.5 MG/DOSE,) 2 MG/3ML SOPN Inject 0.5 mg into the skin once a week. 3 mL 0   valACYclovir  (VALTREX ) 1000 MG tablet Take 1 tablet (1,000 mg total) by mouth 3 (three) times daily. 21 tablet 0   No current facility-administered medications on file prior to visit.    Allergies:   Allergies  Allergen Reactions   Ciprofloxacin  Swelling   Levaquin  [Levofloxacin ] Anaphylaxis    Hives and throat swelling    Amoxil  [Amoxicillin ] Rash    Extreme itching limbs and trunk    Dynacin [Minocycline] Rash   Sulfamethazine Rash      OBJECTIVE:  Physical Exam  Vitals:   05/29/24 1407  BP: 112/72  Pulse: 76  SpO2: 96%  Weight: 214 lb 12.8 oz (97.4 kg)  Height: 5' 6 (1.676 m)    Body mass index is 34.67 kg/m. No results found.  General: well developed, well  nourished, very pleasant middle-age Caucasian female, seated, in no evident distress  Neurologic Exam Mental Status: Awake and fully alert.   Fluent speech and language.  Oriented to place and time. Recent memory mildly impaired and remote memory intact. Attention span, concentration and fund of knowledge mostly appropriate. Mood and affect appropriate.  Cranial Nerves: Pupils equal, briskly reactive to light. Extraocular movements full without nystagmus. Visual fields full to confrontation. Hearing intact. Facial sensation intact. Face, tongue, palate moves normally and symmetrically.  Motor: Normal bulk and tone. Normal strength in all tested extremity muscles Sensory.: intact to touch , pinprick , position and vibratory sensation Coordination: Rapid alternating movements normal in all extremities. Finger-to-nose and heel-to-shin performed accurately bilaterally. Gait and Station: Arises from chair without difficulty. Stance is normal. Gait demonstrates normal stride length and balance without use of AD. Tandem walk and heel toe without difficulty Reflexes: 1+ and symmetric. Toes downgoing.          ASSESSMENT: Sarah Simpson is a 58 y.o. 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 Vascular risk factors include HTN, HLD, remote seizure history,  migraine headaches, former tobacco use and obesity.  Main concern today is in regards to frequent tension type headaches over the past 3-4 months. Short term memory imporved after medication adjustments.      PLAN:  Mild cognitive impairment Improved after medication adjustments  Overall stable without change since prior visit Neurocognitive evaluation 08/2023 largely within normal limits, some impairment of working memory/complex attention, possibly more related to underlying ADHD, depression, chronic sleep disturbance and vitamin D   deficiency Continue to remain off topiramate  and nightly Benadryl  and ZzzQuil Advised to follow-up with PCP as suspect underlying ADHD contributing some to memory difficulties Continue to follow with PCP for vitamin D  deficiency treatment Consider repeat MRI brain if she feels cognition worsens Family history of dementia (mother passed at age 37 after dx of dementia 6 months prior)  Tension type headaches Hx of migraine headaches Onset 3-4 months ago, bilateral frontal temporal, occurring 3-4x per week No red flag sign or symptoms Suspect component of rebound headache from frequent use of Tylenol  and advised to limit use Start Nurtec every other day for prevention, once improved can transition to as needed Triptans contraindicated due to stroke history Previously tried/failed: Gabapentin , topiramate , Lexapro , beta-blocker/additional antihypertensive contraindicated due to low BP Discussed use of magnesium, B2 or CoQ 10 for further benefit Discussed importance of conservative measures to also prevent headaches  OSA: Followed by [facility] Repeat sleep study 12/2023 showed evidence of apnea and currently using oral appliance Will request sleep study be faxed to office for review.  Based on severity of apnea, may need to repeat sleep study to ensure apnea being well treated with oral appliance as this could be contributing to tension headaches ADDENDUM 06/19/2024: Received sleep study report from [facility] which showed moderate OSA with total AHI of 24.4/h and proceeded with oral device treatment as she declined CPAP therapy.  It was recommended to schedule a follow-up sleep study to confirm improvement/good management of apnea but does not appear this was completed.  Will advise patient of these recommendations and to follow back up with [facility] to discuss further.  Cryptogenic stroke: Strokelike episode: Residual deficit: post stroke dysesthesias with noted  improvement. Previously on gabapentin  but per patient request, will discontinue. Advised she can use as needed Continue clopidogrel  75 mg daily and atorvastatin  80 mg daily for secondary stroke prevention managed/prescribed by PCP Discussed secondary stroke prevention measures and importance of close PCP follow up for aggressive stroke risk factor management including BP goal<130/90 and HLD with LDL goal<70   Remote seizure history:  EEG 01/25/2021 unremarkable.  Not currently on ASMs.  No indication at this time.   Continue to monitor.    Follow up in 6 months or call earlier if needed    CC:  PCP: [name] Lamarr RAMAN, MD      Harlene Bogaert, AGNP-BC  [facility] [address] [place], [place] [address]  Phone [phone] Fax [phone] Note: This document was prepared with digital dictation and possible smart phrase technology. Any transcriptional errors that result from this process are unintentional.     "

## 2024-05-29 NOTE — Patient Instructions (Addendum)
 Your Plan:  Continue Plavix  and atorvastatin  for stroke risk factor management   Start Nurtec every other day for headache prevention  Can also start magnesium oxide 500mg  daily or riboflavin 200mg  twice daily or co q 10 300mg  daily to further assist with headache preventoin  Please call if headaches persist  Please limit use of over the counter medications to no more than 2-3 times per week to prevent rebound headaches  Okay to use gabapentin  as needed for worsening neuropathy pain     Follow up in 6 months or call earlier if needed      Thank you for coming to see us  at [facility]. I hope we have been able to provide you high quality care today.  You may receive a patient satisfaction survey over the next few weeks. We would appreciate your feedback and comments so that we may continue to improve ourselves and the health of our patients.

## 2024-05-30 ENCOUNTER — Other Ambulatory Visit: Payer: Self-pay

## 2024-05-30 ENCOUNTER — Other Ambulatory Visit (HOSPITAL_COMMUNITY): Payer: Self-pay

## 2024-05-30 MED ORDER — OZEMPIC (2 MG/DOSE) 8 MG/3ML ~~LOC~~ SOPN
2.0000 mg | PEN_INJECTOR | SUBCUTANEOUS | 0 refills | Status: DC
  Filled 2024-05-30: qty 3, 28d supply, fill #0

## 2024-05-30 MED ORDER — VALACYCLOVIR HCL 500 MG PO TABS
250.0000 mg | ORAL_TABLET | Freq: Every day | ORAL | 3 refills | Status: AC
  Filled 2024-05-30: qty 45, 90d supply, fill #0

## 2024-06-03 ENCOUNTER — Other Ambulatory Visit (HOSPITAL_COMMUNITY): Payer: Self-pay

## 2024-06-03 ENCOUNTER — Telehealth (HOSPITAL_COMMUNITY): Payer: Self-pay | Admitting: Pharmacy Technician

## 2024-06-03 ENCOUNTER — Encounter (HOSPITAL_COMMUNITY): Payer: Self-pay

## 2024-06-03 ENCOUNTER — Encounter: Payer: Self-pay | Admitting: Adult Health

## 2024-06-03 ENCOUNTER — Telehealth (HOSPITAL_COMMUNITY): Payer: Self-pay

## 2024-06-03 NOTE — Telephone Encounter (Signed)
 Medication: Nurtec 75mg  Able to fill? No Prior authorization required? Yes Co-pay before assistance: N/A

## 2024-06-04 ENCOUNTER — Telehealth: Payer: Self-pay

## 2024-06-04 ENCOUNTER — Other Ambulatory Visit: Payer: Self-pay

## 2024-06-04 ENCOUNTER — Other Ambulatory Visit (HOSPITAL_COMMUNITY): Payer: Self-pay

## 2024-06-04 NOTE — Telephone Encounter (Signed)
 PA request has been Approved. New Encounter has been or will be created for follow up. For additional info see Pharmacy Prior Auth telephone encounter from 06/04/2024.  PA has been approved-please fill RX-Thank you!

## 2024-06-04 NOTE — Telephone Encounter (Signed)
 Pharmacy Patient Advocate Encounter   Received notification from Patient Advice Request messages that prior authorization for Nurtec is required/requested.   Insurance verification completed.   The patient is insured through [employer].   Per test claim: PA required; PA submitted to above mentioned insurance via Latent Key/confirmation #/EOC [id number] Status is pending

## 2024-06-04 NOTE — Telephone Encounter (Signed)
 Pharmacy Patient Advocate Encounter  Received notification from [employer] that Prior Authorization for Nurtec has been APPROVED from 06/04/2024 to 06/04/2025. Ran test claim, Copay is $0. This test claim was processed through [facility]- copay amounts may vary at other pharmacies due to pharmacy/plan contracts, or as the patient moves through the different stages of their insurance plan.   PA #/Case ID/Reference #: [id number]

## 2024-06-05 ENCOUNTER — Other Ambulatory Visit (HOSPITAL_COMMUNITY): Payer: Self-pay

## 2024-06-05 ENCOUNTER — Other Ambulatory Visit: Payer: Self-pay

## 2024-06-05 NOTE — Telephone Encounter (Signed)
 PA request has been Approved. New Encounter has been or will be created for follow up. For additional info see Pharmacy Prior Auth telephone encounter from 06/04/2024.SABRA

## 2024-06-09 ENCOUNTER — Other Ambulatory Visit (HOSPITAL_COMMUNITY): Payer: Self-pay

## 2024-06-11 ENCOUNTER — Other Ambulatory Visit (HOSPITAL_COMMUNITY): Payer: Self-pay

## 2024-06-19 ENCOUNTER — Encounter: Payer: Self-pay | Admitting: Adult Health

## 2024-06-23 ENCOUNTER — Other Ambulatory Visit (HOSPITAL_COMMUNITY): Payer: Self-pay

## 2024-06-24 ENCOUNTER — Other Ambulatory Visit (HOSPITAL_COMMUNITY): Payer: Self-pay

## 2024-06-24 ENCOUNTER — Other Ambulatory Visit: Payer: Self-pay

## 2024-06-24 MED ORDER — OZEMPIC (2 MG/DOSE) 8 MG/3ML ~~LOC~~ SOPN
2.0000 mg | PEN_INJECTOR | SUBCUTANEOUS | 0 refills | Status: AC
  Filled 2024-06-24: qty 3, 28d supply, fill #0

## 2024-07-04 ENCOUNTER — Other Ambulatory Visit (HOSPITAL_COMMUNITY): Payer: Self-pay

## 2024-07-04 MED ORDER — MOUNJARO 7.5 MG/0.5ML ~~LOC~~ SOAJ
7.5000 mg | SUBCUTANEOUS | 0 refills | Status: AC
  Filled 2024-07-04: qty 2, 28d supply, fill #0

## 2024-07-05 ENCOUNTER — Other Ambulatory Visit (HOSPITAL_COMMUNITY): Payer: Self-pay

## 2024-07-07 ENCOUNTER — Other Ambulatory Visit (HOSPITAL_COMMUNITY): Payer: Self-pay

## 2024-07-08 ENCOUNTER — Other Ambulatory Visit (HOSPITAL_COMMUNITY): Payer: Self-pay

## 2024-07-08 ENCOUNTER — Other Ambulatory Visit: Payer: Self-pay

## 2024-07-10 ENCOUNTER — Other Ambulatory Visit: Payer: Self-pay

## 2024-07-10 ENCOUNTER — Other Ambulatory Visit (HOSPITAL_COMMUNITY): Payer: Self-pay

## 2024-07-10 MED ORDER — METFORMIN HCL ER 500 MG PO TB24
500.0000 mg | ORAL_TABLET | Freq: Two times a day (BID) | ORAL | 3 refills | Status: AC
  Filled 2024-07-10: qty 180, 90d supply, fill #0

## 2024-07-15 ENCOUNTER — Other Ambulatory Visit (HOSPITAL_COMMUNITY): Payer: Self-pay

## 2024-07-16 ENCOUNTER — Other Ambulatory Visit (HOSPITAL_COMMUNITY): Payer: Self-pay

## 2024-07-17 ENCOUNTER — Other Ambulatory Visit (HOSPITAL_COMMUNITY): Payer: Self-pay

## 2024-12-25 ENCOUNTER — Ambulatory Visit: Admitting: Adult Health
